# Patient Record
Sex: Male | Born: 1955 | Race: Black or African American | Hispanic: No | Marital: Married | Smoking: Current some day smoker
Health system: Southern US, Community
[De-identification: ages and names within clinical notes are randomized; demographics above are authoritative.]

## PROBLEM LIST (undated history)

## (undated) DIAGNOSIS — E785 Hyperlipidemia, unspecified: Secondary | ICD-10-CM

## (undated) DIAGNOSIS — I779 Disorder of arteries and arterioles, unspecified: Secondary | ICD-10-CM

## (undated) DIAGNOSIS — I2510 Atherosclerotic heart disease of native coronary artery without angina pectoris: Secondary | ICD-10-CM

## (undated) DIAGNOSIS — I4510 Unspecified right bundle-branch block: Secondary | ICD-10-CM

## (undated) DIAGNOSIS — I10 Essential (primary) hypertension: Secondary | ICD-10-CM

## (undated) DIAGNOSIS — R7303 Prediabetes: Secondary | ICD-10-CM

## (undated) DIAGNOSIS — R51 Headache: Secondary | ICD-10-CM

## (undated) HISTORY — DX: Essential (primary) hypertension: I10

## (undated) HISTORY — DX: Unspecified right bundle-branch block: I45.10

## (undated) HISTORY — DX: Atherosclerotic heart disease of native coronary artery without angina pectoris: I25.10

## (undated) HISTORY — DX: Prediabetes: R73.03

## (undated) HISTORY — DX: Disorder of arteries and arterioles, unspecified: I77.9

## (undated) HISTORY — DX: Hyperlipidemia, unspecified: E78.5

## (undated) HISTORY — DX: Headache: R51

## (undated) SURGERY — Surgical Case
Anesthesia: *Unknown

---

## 2003-04-16 ENCOUNTER — Inpatient Hospital Stay (HOSPITAL_COMMUNITY): Admission: EM | Admit: 2003-04-16 | Discharge: 2003-04-19 | Payer: Self-pay | Admitting: Cardiovascular Disease

## 2003-04-16 ENCOUNTER — Encounter: Payer: Self-pay | Admitting: Emergency Medicine

## 2003-06-08 ENCOUNTER — Ambulatory Visit (HOSPITAL_COMMUNITY): Admission: RE | Admit: 2003-06-08 | Discharge: 2003-06-08 | Payer: Self-pay | Admitting: Internal Medicine

## 2004-03-29 ENCOUNTER — Ambulatory Visit: Payer: Self-pay | Admitting: Cardiovascular Disease

## 2004-05-11 ENCOUNTER — Ambulatory Visit (HOSPITAL_BASED_OUTPATIENT_CLINIC_OR_DEPARTMENT_OTHER): Admission: RE | Admit: 2004-05-11 | Discharge: 2004-05-11 | Payer: Self-pay | Admitting: Specialist

## 2004-05-11 ENCOUNTER — Ambulatory Visit (HOSPITAL_COMMUNITY): Admission: RE | Admit: 2004-05-11 | Discharge: 2004-05-11 | Payer: Self-pay | Admitting: Specialist

## 2004-09-27 ENCOUNTER — Ambulatory Visit: Payer: Self-pay | Admitting: Cardiovascular Disease

## 2005-03-12 ENCOUNTER — Encounter: Admission: RE | Admit: 2005-03-12 | Discharge: 2005-03-12 | Payer: Self-pay | Admitting: General Practice

## 2005-12-31 ENCOUNTER — Encounter: Admission: RE | Admit: 2005-12-31 | Discharge: 2005-12-31 | Payer: Self-pay | Admitting: General Practice

## 2008-09-06 ENCOUNTER — Encounter: Payer: Self-pay | Admitting: Cardiovascular Disease

## 2008-09-06 ENCOUNTER — Ambulatory Visit: Payer: Self-pay | Admitting: Cardiovascular Disease

## 2008-09-06 DIAGNOSIS — R51 Headache: Secondary | ICD-10-CM

## 2008-09-06 DIAGNOSIS — I10 Essential (primary) hypertension: Secondary | ICD-10-CM | POA: Insufficient documentation

## 2008-09-06 DIAGNOSIS — I4510 Unspecified right bundle-branch block: Secondary | ICD-10-CM

## 2008-09-06 DIAGNOSIS — I2510 Atherosclerotic heart disease of native coronary artery without angina pectoris: Secondary | ICD-10-CM

## 2008-09-06 DIAGNOSIS — R519 Headache, unspecified: Secondary | ICD-10-CM | POA: Insufficient documentation

## 2009-07-19 ENCOUNTER — Encounter (INDEPENDENT_AMBULATORY_CARE_PROVIDER_SITE_OTHER): Payer: Self-pay | Admitting: *Deleted

## 2010-06-22 NOTE — Letter (Signed)
Summary: Appointment - Reminder 2  [employer], Main Office  [address]   [place], [place] [address]   Phone: [phone]  Fax: [phone]     July 19, 2009 MRN: [id number]   [facility] [address] [place], [place]  [address]   Dear Mr. Raine,  Our records indicate that it is time to schedule a follow-up appointment with Dr. Eden Emms. It is very important that we reach you to schedule this appointment. We look forward to participating in your health care needs. Please contact us at the number listed above at your earliest convenience to schedule your appointment.  If you are unable to make an appointment at this time, give [place] a call so we can update our records.     Sincerely,  Migdalia Dk [facility] Scheduling Team

## 2010-10-06 NOTE — Op Note (Signed)
NAME:  RAMY, GRETH NO.:  [id number]   MEDICAL RECORD NO.:  [id number]          PATIENT TYPE:   LOCATION:                                 FACILITY:   PHYSICIAN:  Jene Every, M.D.         DATE OF BIRTH:   DATE OF PROCEDURE:  06/28/2004  DATE OF DISCHARGE:                                 OPERATIVE REPORT   PREOPERATIVE DIAGNOSIS:  Medial meniscus tear, degenerative joint disease of  the right knee.   POSTOPERATIVE DIAGNOSIS:  Medial meniscus tear, degenerative joint disease  of the right knee, anterior cruciate ligament tear.   PROCEDURE PERFORMED:  Right knee arthroscopy, partial medial meniscectomy,  chondroplasty of the patella, medial femoral condyle, debridement of  anterior cruciate ligament.   ANESTHESIA:  General.   HISTORY:  This is a 55 year old who has a tear of the ACL and complex tear  of the posterior horn of the mid body of meniscus.  Degenerative changes and  varus deformity of the knee were noted.  His acute change was a meniscus  tear requiring _________ and old degenerative ACL tear and degenerative  changes of the knee.  The patient had been refractory to conservative  treatment and was indicated for partial medial meniscectomy and debridement.  Risks and benefits were discussed including bleeding, infection, damage to  vascular structures, no change in symptoms, worsening symptoms, need for  total knee arthroplasty in the future, etc.   DESCRIPTION OF PROCEDURE:  With the patient in the supine position after  induction with adequate general anesthesia and 1 g Kefzol, the right lower  extremity was prepped and draped in the usual sterile fashion.  A lateral  parapatellar portal and superior medial parapatellar portal was fashioned  with a #11 blade.  _________ was atraumatically placed.  Irrigant was  utilized and insufflated into the joint.  Arthroscopic camera was inserted  and inspection revealed degenerative changes of the  patellofemoral joint,  mainly of the medial compartment.  There was a complex tear of the posterior  portion of the medial meniscus.  Under direct visualization, the medial  parapatellar portal was fashioned with a #11 blade after localization with  an 18-gauge needle sparing the medial meniscus.  Vascular rongeur was  introduced utilizing partial medial meniscectomy to a stable base.  This was  further contoured with a ________ shaver.  Degenerative changes and  chondroplasty of the femoral condyle and tibial plateau were performed and  evacuation of loose cartilaginous bodies.  Tear of the ACL was debrided.  This was found to be old. There was no significant excursion with anterior  drawer noted.  A lot of degenerative changes of the lateral compartment and  patellofemoral joint were noted.  These were debrided as well.  The gutters  were unremarkable.  The residual meniscus of the medial meniscus was stable  to probe palpation.  The lateral meniscus was stable to probe palpation as  well.  The chondroplasty of the patella was performed as well as there were  patellofemoral changes.  After this, the knee was copiously lavaged.  The  menisci were re-examined.  There was no evidence of residual displacement or  loose cartilaginous debris.  All instrumentation was removed.  Port was  closed with 4-0  nylon simple sutures and 0.25% Marcaine with epinephrine was infiltrated in  the joint.  The wound was dressed sterilely.  He was awakened without  difficulty and transported to recovery in satisfactory condition.   The patient tolerated the procedure well with no complications.      JB/MEDQ  D:  06/28/2004  T:  06/28/2004  Job:  [id number]

## 2010-10-06 NOTE — Discharge Summary (Signed)
NAME:  Todd Gonzalez, Todd Gonzalez NO.:  [id number]   MEDICAL RECORD NO.:  [id number]                   PATIENT TYPE:  INP   LOCATION:  4739                                 FACILITY:  [facility]   PHYSICIAN:  Charlton Haws, M.D.                  DATE OF BIRTH:  [birth year]   DATE OF ADMISSION:  04/16/2003  DATE OF DISCHARGE:                                 DISCHARGE SUMMARY   DISCHARGE DIAGNOSES:  1. Acute anterior myocardial infarction secondary to plaque rupture at the     mid left anterior descending coronary artery, reduced to 30-40% narrowing     after intervention with good residual lumen.  2. Residual coronary artery disease:  A 90% stenosis in tiny diagonal, a 70%     proximal stenosis in the first diagonal, a 70% stenosis in the proximal     septal, a 50% stenosis proximal first obtuse marginal; left ventricular     function at catheterization is 68%.   SECONDARY DIAGNOSES:  1. Hypertension.  2. Dyslipidemia.   PROCEDURES:  1. April 16, 2003:  Left heart catheterization.  Study showed a 30-40%     probably plaque rupture in the mid LAD.  The first diagonal had a     proximal 70% stenosis.  There is a small diagonal following that with a     90% proximal stenosis, a septal perforator with a 70% proximal stenosis;     then, the first obtuse marginal had a 50% proximal stenosis.  Left     ventricular ejection fraction estimated at 68%.  2. April 16, 2003:  A 2-D echocardiogram.  This study showed that     ejection fraction was 55-65%, no left ventricular regional wall motion     abnormalities.  Wall thickness of the left ventricle is normal.  Normal     aortic valve excursion, no significant aortic regurgitation, normal     mitral valve leaflet excursion, no mitral stenosis.  Mitral regurgitation     was 1+ on a scale of 0-4.  Left atrium is normal, right ventricular size     normal.   DISCHARGE DISPOSITION:  Todd Gonzalez is ready for discharge  on  April 19, 2003 three days after undergoing left heart catheterization.  He was maintained on admission on IV heparin, IV Integrilin, and  nitroglycerin, and for a 48-hour period after the procedure was started on  Plavix, continued on Integrilin, maintained on IV heparin which was  subsequently discontinued secondary to bleeding gums.  The patient was then  started on subcutaneous Lovenox.  This has been continued until his  discharge on November 29.  The patient has had no residual chest pain after  undergoing left heart catheterization.  His mental status is clear.  He has  been afebrile in the postoperative period.  His post procedure enzymes are  as follows:  On November 27 CK  is 415, CK-MB is 28.2.  He goes home with the  following medications:  1. Enteric-coated aspirin 325 mg daily.  2. Plavix 75 mg daily - both of these work to keep his coronary arteries     open.  3. Lopressor 50 mg one-half tablet and one-and-one-half tablet in the     evening.  4. Altace 10 mg daily.  5. Lipitor 80 mg daily at bedtime.  6. For pain - Tylenol 325 mg one to two tablets q.4-6h. as needed.   He is not to return to work for three weeks and he has been given an excuse  to present to his employer.  His discharge diet is a low sodium low  cholesterol diet.  He may shower.  He is to call [phone] if he experiences  swelling or increased pain at the catheterization site.  He has an office  visit with [facility] on Monday, May 03, 2003 at noon - this is  the [facility].  He will see Dr. Eden Emms at [facility]  Wednesday, June 16, 2003 at 9 a.m.   BRIEF HISTORY:  Todd Gonzalez is a 55 year old gentleman from [place].  He has no  previous cardiac history.  He awoke at 2 a.m. on November 26 feeling sick to  his stomach.  He ran to the bathroom but did not vomit.  He had subsequent  substernal chest pain.  He has not had previous history of angina.  The pain  is somewhat  atypical for angina.  He has a somewhat tender area on palpation  of the chest and the pain actually starts in the epigastric area.  He has  had minimal changes in the pain, having been given nitroglycerin.  However,  his electrocardiogram shows J point elevation in leads V2 and V3 with some  slight ST segment depression in leads III and aVF.  There is also a  suggestion of J point elevation in lead I and aVL.  Bedside echocardiogram  showed no regional wall motion abnormalities with good left ventricular  function.  His review of systems is unremarkable except for chronic knee  problems.  He had some sort of knee surgery about one month ago.  The  patient was subsequently taken to the cardiac catheterization laboratory  with the results as dictated above.  The patient has been chest pain free  after the procedure.  The narrowing of the mid LAD is probably secondary to  clot with ruptured plaque in the mid LAD.  There is a 30-40% narrowing with  good residual lumen.  The residual disease to the diagonals and the septal  also may all be clot.  The patient will be maintained on Plavix and aspirin  as an outpatient and was treated with 48 hours of Integrilin and heparin.   LABORATORY STUDIES ON ADMISSION:  Complete blood count:  White cells 6.2,  hemoglobin 15.6, hematocrit 45.3, platelets 309.  Serum electrolytes on  admission:  Sodium 138, potassium 3.6, chloride 104, carbonate 27, BUN 19,  creatinine 1.2, glucose 128.  Cardiac enzymes November 26 at 0452:  CK 334,  CK-MB 1.6, troponin I 0.02.  On November 26 at noon:  CK 694, CK-MB 58.5.  On November 26 at 1845:  CK 540, CK-MB 39.3.  On November 27 at 0200:  CK  415, CK-MB 28.2.  D-dimer was 0.49 on admission.  Alkaline phosphatase 65,  SGOT 23, SGPT 38.  Prior to admission the patient had taken Cialis.  The  patient is apparently on no other medications.     Maple Mirza, P.A.                    Charlton Haws, M.D.    GM/MEDQ  D:   04/19/2003  T:  04/19/2003  Job:  [id number]

## 2010-10-06 NOTE — H&P (Signed)
NAME:  Todd Gonzalez, MAMONE NO.:  [id number]   MEDICAL RECORD NO.:  [id number]                   PATIENT TYPE:  EMS   LOCATION:  ED                                   FACILITY:  [facility]   PHYSICIAN:  Charlton Haws, M.D.                  DATE OF BIRTH:  July 26, 1955   DATE OF ADMISSION:  04/16/2003  DATE OF DISCHARGE:                                HISTORY & PHYSICAL   The patient is currently at [facility], but he is being  transferred to [facility] to a telemetry bed.   Mr. Todd Gonzalez is a 55 year old black gentleman from [place].  He has no previous  cardiac history.  He awoke about 2 o'clock this morning feeling sick to his  stomach.  He ran to the bathroom but did not vomit.  He subsequently had  some substernal chest pain.   The patient did not have any blood in his stools or vomit.   He has not had previous angina.  The pain is somewhat atypical for angina.  He is somewhat tender to palpation in his chest, and the pain actually  starts in his epigastric area.   He has had minimal changes with nitroglycerin up to 15 mcg in the emergency  room.   His electrocardiogram is somewhat worrisome.  He has J-point elevation in  leads V2 and V3 with slight ST segment depression in leads III and aVF.  There is also suggestion of J-point elevation in I and aVL.   Bedside echocardiogram, however, showed no regional wall motion  abnormalities with good LV function.   The patient's Review of Systems is otherwise remarkable for some chronic  knee problems.  He had some sort of knee surgery about a month ago.   There is no clinical evidence for DVT.  He is not short of breath, and there  is no pleuritic nature to the pain.   The patient has been in [place] for a few years.  He is here with a  friend.  He works at [employer].   ALLERGIES:  No known allergies.   MEDICATIONS:  He does not know his medications but does take some anti-  inflammatories.   There is no history of bleeding diaphysis.   PHYSICAL EXAMINATION:  GENERAL:  On examination, he is somewhat teary eyed.  He is in mild distress.  He has some pain to palpation in the epigastric  area and chest.  VITAL SIGNS:  Blood pressure 130/70, pulse 78 and regular.  LUNGS:  Clear.  NECK:  Carotids are normal.  HEART:  There is an S1, S2, normal heart sounds.  EXTREMITIES:  Distal pulses are intact with no edema.   LABORATORY DATA:  EKGs were as described.   Chest x-ray shows mild cardiomegaly but no congestive heart failure or  infiltrates.   IMPRESSION:  Mr. Larch presentation is somewhat atypical  for coronary  syndrome.  However, I am somewhat concerned about his electrocardiogram,  particularly the possible reciprocal depression in the inferior leads.   PLAN:  1. We will treat him aggressively with heparin, Integrilin, nitroglycerin,     IV beta blockers, and aspirin.  2. He will be transferred to [facility].  If his CPKs or troponins     are elevated, we will take him to the catheterization lab.  3. We will also consider taking him emergently to the catheterization lab if     his pain worsens.  4. The patient will have a followup EKG and enzymes in two hours which will     be approximately 7 this morning.  5. We will also do a formal 2-D echocardiogram to further assess LV function     and wall motion.  6. The patient will be shaved and prepped and have consent signed in case he     needs to go urgently to the catheterization lab.  7. The patient and his friend understand the risks of catheterization     including bleeding, myocardial infarction, need for emergency surgery,     and stroke.  They understand the need for this.  8. If his pain resolves quickly and his enzymes are negative, it may be     possible to pursue a noninvasive evaluation, but I will have a low     threshold to proceed with catheterization tonight.                                                Charlton Haws, M.D.    PN/MEDQ  D:  04/16/2003  T:  04/16/2003  Job:  [id number]

## 2010-10-06 NOTE — Cardiovascular Report (Signed)
NAME:  Todd Gonzalez, RUDY NO.:  [id number]   MEDICAL RECORD NO.:  [id number]                   PATIENT TYPE:  INP   LOCATION:  2903                                 FACILITY:  [facility]   PHYSICIAN:  Arturo Morton. Riley Kill, M.D.             DATE OF BIRTH:  [birth year]   DATE OF PROCEDURE:  04/16/2003  DATE OF DISCHARGE:                              CARDIAC CATHETERIZATION   INDICATIONS FOR PROCEDURE:  This gentleman is a 55 year old who presents  with some recurrent chest pain. He has borderline elevated enzymes and there  is an abnormal EKG with slight ST elevation in the anterior precordial  leads. He has continued to have some discomfort  and therefore  he was  brought to the cardiac catheterization laboratory  for emergency evaluation  and for discussion with Dr. Geralynn Rile, who saw the patient and recommended  urgent catheterization. The  risks, benefits and alternatives were discussed  by Dr. Samule Ohm with the patient and he was brought to the laboratory  emergently.   DESCRIPTION OF PROCEDURE:  1. Left heart catheterization.  2. Selective coronary arteriography.  3. Selective left ventriculography.  4. AngioSeal closure of the right femoral artery.   DESCRIPTION OF PROCEDURE:  The patient was brought to the catheterization  laboratory  and prepped and draped in the usual sterile fashion. Through an  anterior puncture the right femoral artery was easily entered. Views of the  left and right coronary arteries were obtained in multiple angiographic  projections. We used a ______ catheter to engage the right coronary artery.  Ventriculography was performed in the RAO projection.   Following this I reviewed the films subsequently with both Dr. Gerri Spore and  Dr. Samule Ohm. The patient had evidence of a mild to moderate narrowing of the  LAD with about 30% to 40% narrowing of both the residual lumen of  at least  2.5 and probably 3 mm. There were 3 small  side  branches including a septal  perforator and probably 2 diagonal branches which had some narrowing,  probably related to in part thrombus. There was excellent runoff into the  distal vessel. Left ventricular function in this territory was well  preserved. Based on this we felt that medical therapy at the present time  with aggressive anticoagulation would be the best option.   Because of this I elected to used AngioSeal closure of the right femoral  artery to ensure that he would get adequate hemostasis. The groin was  reprepped  with Betadine. We left this for at least 5 minutes. Gloves were  changed. The femoral artery was then closed using AngioSeal with good  immediate hemostasis. The patient was then taken to the holding area in  satisfactory clinical condition.   HEMODYNAMIC DATA:  1. Central aortic pressure was 155/91, mean of 116.  2. Left ventricular pressure 140/12.  3. No gradient on pullback across the aortic valve.  ANGIOGRAPHIC DATA:  1. The left main coronary artery was free of critical disease. The left     anterior descending coronary artery coursed to the apex. There was a     focal LAD stenosis overlapping the origin of a septal and diagonal, and     then a 2nd branch which appeared  to be partially a diagonal and possibly     a tiny fistulous vessel to the pulmonary artery. This was really quite     small. The area of the LAD had about 30% to 40% narrowing. The 1st     diagonal had 70% narrowing. It was a small to moderate vessel. The     pulmonary  fistulous vessel was tiny. The septal perforator was small  to     moderate and had 70% proximal narrowing. The remainder of the LAD was     without critical disease and wrapped the apex.  2. The circumflex provided  a 1st marginal branch that  was large in caliber     and without critical narrowing. There was a 2nd marginal branch with     about 50% proximal narrowing and it was small  to moderate. The distal      vessel provided the posterolateral segment.  3. The right coronary artery was a codominant vessel with 2 small  branches.     This vessel appeared  free of critical disease.  4. Ventriculography in the RAO projection was associated with some     ventricular ectopy. The ejection fraction was calculated at 49%.   CONCLUSIONS:  1. Overall well preserved left ventricular function.  2. Probable focal stenosis with possible  ruptured plaque and surface     thrombus in the mid left anterior descending artery but with a     significant residual  lumen.  3. Other findings as noted above.   RECOMMENDATIONS:  We have discussed the various options. Placing a stent  across this area would probably result in loss of these 3 branches. In  addition the MLD appears to be more than adequate for distal flow. Based on  this we will load the patient with Plavix. He will be treated with low-dose  heparin starting at 8 hours and then increased at 24 hours when the  Integrilin is discontinued. The patient had Cialis about 4 days ago and we  will watch for low blood pressure, although his blood pressure is more than  adequate on nitroglycerin.                                               Arturo Morton. Riley Kill, M.D.    TDS/MEDQ  D:  04/16/2003  T:  04/16/2003  Job:  [id number]   cc:   Charlton Haws, M.D.   Salvadore Farber, M.D.  [address]  [address]  [place]  [place] [address]

## 2010-11-14 ENCOUNTER — Encounter: Payer: Self-pay | Admitting: Cardiovascular Disease

## 2011-12-20 ENCOUNTER — Encounter: Payer: Self-pay | Admitting: Cardiovascular Disease

## 2013-03-20 ENCOUNTER — Emergency Department (HOSPITAL_COMMUNITY)
Admission: EM | Admit: 2013-03-20 | Discharge: 2013-03-20 | Disposition: A | Discharge status: Home / Self Care | Payer: Self-pay | Attending: Emergency Medicine | Admitting: Emergency Medicine

## 2013-03-20 ENCOUNTER — Encounter (HOSPITAL_COMMUNITY): Payer: Self-pay | Admitting: Emergency Medicine

## 2013-03-20 ENCOUNTER — Emergency Department (HOSPITAL_COMMUNITY): Payer: Self-pay

## 2013-03-20 DIAGNOSIS — J159 Unspecified bacterial pneumonia: Secondary | ICD-10-CM | POA: Insufficient documentation

## 2013-03-20 DIAGNOSIS — Z8639 Personal history of other endocrine, nutritional and metabolic disease: Secondary | ICD-10-CM | POA: Insufficient documentation

## 2013-03-20 DIAGNOSIS — J40 Bronchitis, not specified as acute or chronic: Secondary | ICD-10-CM

## 2013-03-20 DIAGNOSIS — I10 Essential (primary) hypertension: Secondary | ICD-10-CM | POA: Insufficient documentation

## 2013-03-20 DIAGNOSIS — Z79899 Other long term (current) drug therapy: Secondary | ICD-10-CM | POA: Insufficient documentation

## 2013-03-20 DIAGNOSIS — R0602 Shortness of breath: Secondary | ICD-10-CM | POA: Insufficient documentation

## 2013-03-20 DIAGNOSIS — J209 Acute bronchitis, unspecified: Secondary | ICD-10-CM | POA: Insufficient documentation

## 2013-03-20 DIAGNOSIS — R509 Fever, unspecified: Secondary | ICD-10-CM | POA: Insufficient documentation

## 2013-03-20 DIAGNOSIS — Z862 Personal history of diseases of the blood and blood-forming organs and certain disorders involving the immune mechanism: Secondary | ICD-10-CM | POA: Insufficient documentation

## 2013-03-20 DIAGNOSIS — R5381 Other malaise: Secondary | ICD-10-CM | POA: Insufficient documentation

## 2013-03-20 DIAGNOSIS — I2510 Atherosclerotic heart disease of native coronary artery without angina pectoris: Secondary | ICD-10-CM | POA: Insufficient documentation

## 2013-03-20 DIAGNOSIS — Z791 Long term (current) use of non-steroidal anti-inflammatories (NSAID): Secondary | ICD-10-CM | POA: Insufficient documentation

## 2013-03-20 DIAGNOSIS — Z7982 Long term (current) use of aspirin: Secondary | ICD-10-CM | POA: Insufficient documentation

## 2013-03-20 DIAGNOSIS — J189 Pneumonia, unspecified organism: Secondary | ICD-10-CM

## 2013-03-20 DIAGNOSIS — F17200 Nicotine dependence, unspecified, uncomplicated: Secondary | ICD-10-CM | POA: Insufficient documentation

## 2013-03-20 LAB — BASIC METABOLIC PANEL
BUN: 16 mg/dL (ref 6–23)
CO2: 24 mEq/L (ref 19–32)
Calcium: 9.2 mg/dL (ref 8.4–10.5)
Chloride: 96 mEq/L (ref 96–112)
Creatinine, Ser: 1.05 mg/dL (ref 0.50–1.35)
Glucose, Bld: 147 mg/dL — ABNORMAL HIGH (ref 70–99)
Sodium: 133 mEq/L — ABNORMAL LOW (ref 135–145)

## 2013-03-20 LAB — CBC
HCT: 38.1 % — ABNORMAL LOW (ref 39.0–52.0)
MCH: 30.9 pg (ref 26.0–34.0)
MCV: 86 fL (ref 78.0–100.0)
Platelets: 213 10*3/uL (ref 150–400)
RBC: 4.43 MIL/uL (ref 4.22–5.81)
WBC: 22.3 10*3/uL — ABNORMAL HIGH (ref 4.0–10.5)

## 2013-03-20 LAB — POCT I-STAT TROPONIN I: Troponin i, poc: 0 ng/mL (ref 0.00–0.08)

## 2013-03-20 MED ORDER — KETOROLAC TROMETHAMINE 30 MG/ML IJ SOLN
30.0000 mg | Freq: Once | INTRAMUSCULAR | Status: AC
  Administered 2013-03-20: 30 mg via INTRAVENOUS
  Filled 2013-03-20: qty 1

## 2013-03-20 MED ORDER — IBUPROFEN 800 MG PO TABS: 800.0000 mg | ORAL_TABLET | Freq: Once | ORAL | Status: DC

## 2013-03-20 MED ORDER — ACETAMINOPHEN 325 MG PO TABS
650.0000 mg | ORAL_TABLET | Freq: Once | ORAL | Status: AC
  Administered 2013-03-20: 650 mg via ORAL
  Filled 2013-03-20: qty 2

## 2013-03-20 MED ORDER — BENZONATATE 100 MG PO CAPS: 100.0000 mg | ORAL_CAPSULE | Freq: Three times a day (TID) | ORAL | Status: DC

## 2013-03-20 MED ORDER — SODIUM CHLORIDE 0.9 % IV BOLUS (SEPSIS)
1000.0000 mL | Freq: Once | INTRAVENOUS | Status: AC
  Administered 2013-03-20: 1000 mL via INTRAVENOUS

## 2013-03-20 MED ORDER — LEVOFLOXACIN 500 MG PO TABS: 500.0000 mg | ORAL_TABLET | Freq: Every day | ORAL | Status: DC

## 2013-03-20 NOTE — ED Notes (Signed)
Pt back from x-ray.

## 2013-03-20 NOTE — ED Notes (Signed)
Pt reports painful inspiration since yesterday, and also loss of appetite. He went to his doctor who told him he should come to the ED because "my pulse felt fast when he checked it." A&Ox4, resp e/u

## 2013-03-20 NOTE — ED Provider Notes (Signed)
CSN: [id number]     Arrival date & time 03/20/13  1622 History   First MD Initiated Contact with Patient 03/20/13 1730     Chief Complaint  Patient presents with  . Chest Pain    HPI  Patient presents with right-sided chest pain present intermittent right mid pleuritic chest pain. Nonradiating. No shoulder pain no left-sided pain. He is not short of breath. Has had a cough. Seen his primary care physician and referred here for heart rate 126. Afebrile his physicians. At the time of my initial examination I rechecked his temperature in the room and it  is 101.4 no GI complaints. No left-sided pain.  Past Medical History  Diagnosis Date  . CAD (coronary artery disease)     ruptured plaque in OM 2003  . HTN (hypertension)   . HLD (hyperlipidemia)   . Headache(784.0)   . RBBB (right bundle branch block)    History reviewed. No pertinent past surgical history. History reviewed. No pertinent family history. History  Substance Use Topics  . Smoking status: Current Some Day Smoker  . Smokeless tobacco: Not on file     Comment: non-smoker  . Alcohol Use: Yes    Review of Systems  Constitutional: Positive for fever and fatigue. Negative for chills, diaphoresis and appetite change.  HENT: Negative for mouth sores, sore throat and trouble swallowing.   Eyes: Negative for visual disturbance.  Respiratory: Positive for cough and shortness of breath. Negative for chest tightness and wheezing.   Cardiovascular: Positive for chest pain.  Gastrointestinal: Negative for nausea, vomiting, abdominal pain, diarrhea and abdominal distention.  Endocrine: Negative for polydipsia, polyphagia and polyuria.  Genitourinary: Negative for dysuria, frequency and hematuria.  Musculoskeletal: Negative for gait problem.  Skin: Negative for color change, pallor and rash.  Neurological: Negative for dizziness, syncope, light-headedness and headaches.  Hematological: Does not bruise/bleed easily.    Psychiatric/Behavioral: Negative for behavioral problems and confusion.    Allergies  Atorvastatin  Home Medications   Current Outpatient Rx  Name  Route  Sig  Dispense  Refill  . aspirin 325 MG tablet   Oral   Take 325 mg by mouth daily.           . naproxen sodium (ANAPROX) 220 MG tablet   Oral   Take 220 mg by mouth 2 (two) times daily with a meal.         . benzonatate (TESSALON) 100 MG capsule   Oral   Take 1 capsule (100 mg total) by mouth every 8 (eight) hours.   21 capsule   0   . levofloxacin (LEVAQUIN) 500 MG tablet   Oral   Take 1 tablet (500 mg total) by mouth daily.   10 tablet   0    BP 133/77  Pulse 126  Temp(Src) 99.4 F (37.4 C) (Oral)  Resp 18  Ht 5\' 8"  (1.727 m)  Wt 164 lb 3.2 oz (74.481 kg)  BMI 24.97 kg/m2  SpO2 97% Physical Exam  Constitutional: He is oriented to person, place, and time. He appears well-developed and well-nourished. No distress.  HENT:  Head: Normocephalic.  Eyes: Conjunctivae are normal. Pupils are equal, round, and reactive to light. No scleral icterus.  Neck: Normal range of motion. Neck supple. No thyromegaly present.  Cardiovascular: Normal rate and regular rhythm.  Exam reveals no gallop and no friction rub.   No murmur heard. Pulmonary/Chest: Effort normal. No respiratory distress. He has no wheezes. He has rhonchi in the right  middle field. He has no rales.      Abdominal: Soft. Bowel sounds are normal. He exhibits no distension. There is no tenderness. There is no rebound.  Musculoskeletal: Normal range of motion.  Neurological: He is alert and oriented to person, place, and time.  Skin: Skin is warm and dry. No rash noted.  Psychiatric: He has a normal mood and affect. His behavior is normal.    ED Course  Procedures (including critical care time) Labs Review Labs Reviewed  CBC - Abnormal; Notable for the following:    WBC 22.3 (*)    HCT 38.1 (*)    All other components within normal limits   BASIC METABOLIC PANEL - Abnormal; Notable for the following:    Sodium 133 (*)    Glucose, Bld 147 (*)    GFR calc non Af Amer 76 (*)    GFR calc Af Amer 88 (*)    All other components within normal limits  POCT I-STAT TROPONIN I   Imaging Review Dg Chest 2 View  03/20/2013   CLINICAL DATA:  Chest pain today. Fever and headache. History of myocardial infarction and hypertension.  EXAM: CHEST  2 VIEW  COMPARISON:  None.  FINDINGS: The heart size and mediastinal contours are normal. The lungs are clear. There is no pleural effusion or pneumothorax. No acute osseous findings are identified. Age advanced glenohumeral degenerative changes are present, worse on the left.  IMPRESSION: No active cardiopulmonary process. Age advanced glenohumeral degenerative changes bilaterally. This could be posttraumatic, although neuropathic joint should be considered.   Electronically Signed   By: Roxy Horseman M.D.   On: 03/20/2013 18:21    EKG Interpretation   None       MDM   1. Fever   2. Bronchitis   3. Community acquired pneumonia    On initial evaluation Mr. dye had a temperature of 101.4. Heart rate was 126. He's been given Tylenol, 1 L fluid, 30 of Toradol. He has some rhonchi to his anterior posterior mid right lung. No frank crackles. No diminished breath sounds. X-ray does not show infiltrate. After the above he was feeling improved. He remains well oxygenated. Recheck temperature meantime 101.9 given some Motrin here. Think this may be a viral and pleurisy. This may be early pneumonia. Think is probably appropriate for outpatient treatment.  He states he feels much better. Plan Levaquin and Tessalon Motrin Tylenol fever information bronchitis information pneumonia information recheck here shortness of breath bloody cough worsening symptoms left-sided pain.    Roney Marion, MD 03/20/13 [phone]

## 2013-03-20 NOTE — ED Notes (Signed)
Pt states when he stands up he sometimes feels dizzy. Denies N/V/D and SOB

## 2013-03-20 NOTE — ED Notes (Signed)
Pt stats day before yesterday, started to feel cold, thought it was a fever. States it hurts when he breathes in near right upper chest and right lower abdomen. Pt states when he holds still he is not in any pain. Pt has history of "blood clots" and states hes had a cardiac catheter before and was placed on aspirin.

## 2013-03-30 ENCOUNTER — Emergency Department (HOSPITAL_COMMUNITY)
Admission: EM | Admit: 2013-03-30 | Discharge: 2013-03-30 | Disposition: A | Discharge status: Home / Self Care | Payer: Self-pay | Attending: Emergency Medicine | Admitting: Emergency Medicine

## 2013-03-30 ENCOUNTER — Emergency Department (HOSPITAL_COMMUNITY): Payer: Self-pay

## 2013-03-30 ENCOUNTER — Encounter (HOSPITAL_COMMUNITY): Payer: Self-pay | Admitting: Emergency Medicine

## 2013-03-30 DIAGNOSIS — Z8639 Personal history of other endocrine, nutritional and metabolic disease: Secondary | ICD-10-CM | POA: Insufficient documentation

## 2013-03-30 DIAGNOSIS — M1710 Unilateral primary osteoarthritis, unspecified knee: Secondary | ICD-10-CM | POA: Insufficient documentation

## 2013-03-30 DIAGNOSIS — Z792 Long term (current) use of antibiotics: Secondary | ICD-10-CM | POA: Insufficient documentation

## 2013-03-30 DIAGNOSIS — I10 Essential (primary) hypertension: Secondary | ICD-10-CM | POA: Insufficient documentation

## 2013-03-30 DIAGNOSIS — I2510 Atherosclerotic heart disease of native coronary artery without angina pectoris: Secondary | ICD-10-CM | POA: Insufficient documentation

## 2013-03-30 DIAGNOSIS — IMO0002 Reserved for concepts with insufficient information to code with codable children: Secondary | ICD-10-CM | POA: Insufficient documentation

## 2013-03-30 DIAGNOSIS — Z862 Personal history of diseases of the blood and blood-forming organs and certain disorders involving the immune mechanism: Secondary | ICD-10-CM | POA: Insufficient documentation

## 2013-03-30 DIAGNOSIS — M1990 Unspecified osteoarthritis, unspecified site: Secondary | ICD-10-CM

## 2013-03-30 DIAGNOSIS — F17200 Nicotine dependence, unspecified, uncomplicated: Secondary | ICD-10-CM | POA: Insufficient documentation

## 2013-03-30 DIAGNOSIS — Z79899 Other long term (current) drug therapy: Secondary | ICD-10-CM | POA: Insufficient documentation

## 2013-03-30 DIAGNOSIS — Z7982 Long term (current) use of aspirin: Secondary | ICD-10-CM | POA: Insufficient documentation

## 2013-03-30 MED ORDER — HYDROCODONE-ACETAMINOPHEN 5-325 MG PO TABS: 2.0000 | ORAL_TABLET | Freq: Four times a day (QID) | ORAL | Status: DC | PRN

## 2013-03-30 NOTE — ED Notes (Signed)
Pt c/o right knee pain x 1 week; pt denies obvious injury

## 2013-03-30 NOTE — ED Provider Notes (Signed)
Medical screening examination/treatment/procedure(s) were performed by non-physician practitioner and as supervising physician I was immediately available for consultation/collaboration.  Paddy Neis L Mansa Willers, MD 03/30/13 1616 

## 2013-03-30 NOTE — ED Provider Notes (Signed)
CSN: [id number]     Arrival date & time 03/30/13  1031 History  This chart was scribed for non-physician practitioner Roxy Horseman, PA-C  working with Flint Melter, MD by Leone Payor, ED Scribe. This patient was seen in room TR08C/TR08C and the patient's care was started at 1031.    Chief Complaint  Patient presents with  . Knee Pain    The history is provided by the patient. No language interpreter was used.    HPI Comments: Todd Gonzalez is a 57 y.o. male who presents to the Emergency Department complaining of constant, unchanged right knee pain that began about 6 days ago. Pt denies any recent falls, MVC's, or trauma recently. He denies similar symptoms in the past. Pt states he has been using a cane for the past few days due to the pain. He reports certain movements worsen the pain. He denies numbness.    Past Medical History  Diagnosis Date  . CAD (coronary artery disease)     ruptured plaque in OM 2003  . HTN (hypertension)   . HLD (hyperlipidemia)   . Headache(784.0)   . RBBB (right bundle branch block)    History reviewed. No pertinent past surgical history. History reviewed. No pertinent family history. History  Substance Use Topics  . Smoking status: Current Some Day Smoker  . Smokeless tobacco: Not on file     Comment: non-smoker  . Alcohol Use: Yes    Review of Systems A complete 10 system review of systems was obtained and all systems are negative except as noted in the HPI and PMH.   Allergies  Atorvastatin  Home Medications   Current Outpatient Rx  Name  Route  Sig  Dispense  Refill  . aspirin 325 MG tablet   Oral   Take 325 mg by mouth daily.           . benzonatate (TESSALON) 100 MG capsule   Oral   Take 1 capsule (100 mg total) by mouth every 8 (eight) hours.   21 capsule   0   . levofloxacin (LEVAQUIN) 500 MG tablet   Oral   Take 500 mg by mouth daily. For 10 days         . naproxen sodium (ANAPROX) 220 MG tablet   Oral   Take  220 mg by mouth 2 (two) times daily with a meal.          BP 149/90  Pulse 110  Temp(Src) 97.1 F (36.2 C) (Oral)  Resp 18  Ht 5\' 6"  (1.676 m)  Wt 164 lb (74.39 kg)  BMI 26.48 kg/m2  SpO2 99% Physical Exam  Nursing note and vitals reviewed. Constitutional: He is oriented to person, place, and time. He appears well-developed and well-nourished.  HENT:  Head: Normocephalic and atraumatic.  Cardiovascular: Normal rate.   Pulmonary/Chest: Effort normal.  Abdominal: He exhibits no distension.  Musculoskeletal:       Right knee: He exhibits no erythema. Tenderness found. Lateral joint line tenderness noted.  Right knee moderately swollen, non erythematous. Tenderness to palpation with lateral joint line involvement. mildly painful with flexion and extension. Strength 5/5. Joint stability testing is intact, but testing could be limited secondary to patient guarding.   Neurological: He is alert and oriented to person, place, and time.  Skin: Skin is warm and dry.  Psychiatric: He has a normal mood and affect.    ED Course  Procedures   DIAGNOSTIC STUDIES: Oxygen Saturation is 99% on  RA, normal by my interpretation.    COORDINATION OF CARE: 11:17 AM Will order XRAY of right knee. Discussed treatment plan with pt at bedside and pt agreed to plan.   Labs Review Labs Reviewed - No data to display Imaging Review Dg Knee Complete 4 Views Right  03/30/2013   CLINICAL DATA:  Pain and swelling  EXAM: RIGHT KNEE - COMPLETE 4+ VIEW  COMPARISON:  None.  FINDINGS: Tricompartmental right knee osteoarthritis noted, most pronounced in the medial compartment. Slight soft tissue swelling medially noted on the frontal view. Diffuse sclerosis at osteophyte formation noted. Normal alignment without fracture. No large effusion.  IMPRESSION: Tricompartmental osteoarthritis. No acute finding by plain radiography.   Electronically Signed   By: Ruel Favors M.D.   On: 03/30/2013 11:53    EKG  Interpretation   None       MDM   1. Osteoarthritis    Patient with right knee pain, no acute injury. No erythema, or signs of infection. Patient is afebrile. Vital signs are stable. Discharged to home with orthopedic followup. Knee plain films remarkable for osteoarthritis. Recommend conservative therapy. Will give a knee sleeve. Patient is stable and ready for discharge.  Had a thorough recently for chest pain/tachycardia.  This workup was reviewed.  The patient does not complain of any chest pain or SOB.  I personally performed the services described in this documentation, which was scribed in my presence. The recorded information has been reviewed and is accurate.    Roxy Horseman, PA-C 03/30/13 1527

## 2018-04-30 ENCOUNTER — Encounter (HOSPITAL_COMMUNITY): Payer: Self-pay | Admitting: Emergency Medicine

## 2018-04-30 ENCOUNTER — Other Ambulatory Visit: Payer: Self-pay

## 2018-04-30 ENCOUNTER — Emergency Department (HOSPITAL_COMMUNITY)
Admission: EM | Admit: 2018-04-30 | Discharge: 2018-04-30 | Disposition: A | Discharge status: Home / Self Care | Payer: Self-pay | Attending: Emergency Medicine | Admitting: Emergency Medicine

## 2018-04-30 DIAGNOSIS — K4090 Unilateral inguinal hernia, without obstruction or gangrene, not specified as recurrent: Secondary | ICD-10-CM | POA: Insufficient documentation

## 2018-04-30 DIAGNOSIS — Z79899 Other long term (current) drug therapy: Secondary | ICD-10-CM | POA: Insufficient documentation

## 2018-04-30 DIAGNOSIS — Z7982 Long term (current) use of aspirin: Secondary | ICD-10-CM | POA: Insufficient documentation

## 2018-04-30 DIAGNOSIS — I10 Essential (primary) hypertension: Secondary | ICD-10-CM | POA: Insufficient documentation

## 2018-04-30 DIAGNOSIS — I2510 Atherosclerotic heart disease of native coronary artery without angina pectoris: Secondary | ICD-10-CM | POA: Insufficient documentation

## 2018-04-30 DIAGNOSIS — F17200 Nicotine dependence, unspecified, uncomplicated: Secondary | ICD-10-CM | POA: Insufficient documentation

## 2018-04-30 LAB — COMPREHENSIVE METABOLIC PANEL
ALT: 24 U/L (ref 0–44)
AST: 22 U/L (ref 15–41)
Albumin: 3.6 g/dL (ref 3.5–5.0)
Alkaline Phosphatase: 56 U/L (ref 38–126)
Anion gap: 11 (ref 5–15)
BILIRUBIN TOTAL: 0.9 mg/dL (ref 0.3–1.2)
BUN: 14 mg/dL (ref 8–23)
CHLORIDE: 105 mmol/L (ref 98–111)
CO2: 23 mmol/L (ref 22–32)
CREATININE: 0.91 mg/dL (ref 0.61–1.24)
Calcium: 9.1 mg/dL (ref 8.9–10.3)
GFR calc Af Amer: >60 mL/min (ref >60)
GFR calc non Af Amer: >60 mL/min (ref >60)
GLUCOSE: 143 mg/dL — AB (ref 70–99)
Potassium: 4 mmol/L (ref 3.5–5.1)
Sodium: 139 mmol/L (ref 135–145)
Total Protein: 7.1 g/dL (ref 6.5–8.1)

## 2018-04-30 LAB — URINALYSIS, ROUTINE W REFLEX MICROSCOPIC
BACTERIA UA: NONE SEEN
Bilirubin Urine: NEGATIVE
Glucose, UA: NEGATIVE mg/dL
Ketones, ur: NEGATIVE mg/dL
Leukocytes, UA: NEGATIVE
Nitrite: NEGATIVE
PH: 5 (ref 5.0–8.0)
Protein, ur: NEGATIVE mg/dL
Specific Gravity, Urine: 1.024 (ref 1.005–1.030)

## 2018-04-30 LAB — CBC
HCT: 41.9 % (ref 39.0–52.0)
Hemoglobin: 13.4 g/dL (ref 13.0–17.0)
MCH: 28.9 pg (ref 26.0–34.0)
MCHC: 32 g/dL (ref 30.0–36.0)
MCV: 90.3 fL (ref 80.0–100.0)
PLATELETS: 327 10*3/uL (ref 150–400)
RBC: 4.64 MIL/uL (ref 4.22–5.81)
RDW: 13.1 % (ref 11.5–15.5)
WBC: 9.4 10*3/uL (ref 4.0–10.5)
nRBC: 0 % (ref 0.0–0.2)

## 2018-04-30 LAB — LIPASE, BLOOD: Lipase: 28 U/L (ref 11–51)

## 2018-04-30 MED ORDER — POLYETHYLENE GLYCOL 3350 17 G PO PACK
17.0000 g | PACK | Freq: Every day | ORAL | 0 refills | Status: AC
Start: 2018-04-30 — End: 2018-05-30

## 2018-04-30 NOTE — ED Triage Notes (Signed)
Pt reports 10/10 lower abd pain that hurts with movement and coughing. Productive cough with tan-ish colored mucous that started two days ago. Pt denies N/V/D. Denies sob.

## 2018-04-30 NOTE — ED Provider Notes (Signed)
[facility]   CSN: [id number] Arrival date & time: 04/30/18  1845     History   Chief Complaint Chief Complaint  Patient presents with  . Abdominal Pain  . Cough    HPI Todd Gonzalez is a 62 y.o. male.  The history is provided by the patient.  Abdominal Pain   This is a new problem. The current episode started more than 2 days ago. The problem occurs daily. The problem has not changed since onset.The pain is associated with an unknown factor. The pain is located in the RLQ. The quality of the pain is aching and dull. The pain is at a severity of 2/10. The pain is mild. Pertinent negatives include anorexia, fever, belching, diarrhea, flatus, hematochezia, melena, nausea, vomiting, constipation, dysuria, frequency, hematuria, headaches, arthralgias and myalgias. Nothing aggravates the symptoms. Nothing relieves the symptoms. Past workup does not include GI consult or surgery. His past medical history does not include PUD, GERD or ulcerative colitis.    Past Medical History:  Diagnosis Date  . CAD (coronary artery disease)    ruptured plaque in OM 2003  . Headache(784.0)   . HLD (hyperlipidemia)   . HTN (hypertension)   . RBBB (right bundle branch block)     Patient Active Problem List   Diagnosis Date Noted  . ESSENTIAL HYPERTENSION, BENIGN 09/06/2008  . CORONARY ATHEROSCLEROSIS NATIVE CORONARY ARTERY 09/06/2008  . RBBB 09/06/2008  . HEADACHE 09/06/2008    History reviewed. No pertinent surgical history.      Home Medications    Prior to Admission medications   Medication Sig Start Date End Date Taking? Authorizing Provider  aspirin 325 MG tablet Take 325 mg by mouth daily.     Yes [provider]  naproxen sodium (ANAPROX) 220 MG tablet Take 220 mg by mouth 2 (two) times daily with a meal.   Yes [provider]  predniSONE (DELTASONE) 5 MG tablet Take 10 mg by mouth daily as needed (knee  pain).   Yes [provider]  benzonatate (TESSALON) 100 MG capsule Take 1 capsule (100 mg total) by mouth every 8 (eight) hours. Patient not taking: Reported on 04/30/2018 03/20/13   Rolland PorterJames, Mark, MD  HYDROcodone-acetaminophen (NORCO/VICODIN) 5-325 MG per tablet Take 2 tablets by mouth every 6 (six) hours as needed. Patient not taking: Reported on 04/30/2018 03/30/13   Roxy HorsemanBrowning, Robert, PA-C  polyethylene glycol [facility](MIRALAX / Ethelene HalGLYCOLAX) packet Take 17 g by mouth daily. 04/30/18 05/30/18  Virgina Norfolkuratolo, Mahaila Tischer, DO    Family History No family history on file.  Social History Social History   Tobacco Use  . Smoking status: Current Some Day Smoker    Packs/day: 0.25  . Smokeless tobacco: Never Used  Substance Use Topics  . Alcohol use: Yes    Comment: occ  . Drug use: No     Allergies   Atorvastatin   Review of Systems Review of Systems  Constitutional: Negative for chills and fever.  HENT: Negative for ear pain and sore throat.   Eyes: Negative for pain and visual disturbance.  Respiratory: Negative for cough and shortness of breath.   Cardiovascular: Negative for chest pain and palpitations.  Gastrointestinal: Positive for abdominal pain. Negative for anorexia, constipation, diarrhea, flatus, hematochezia, melena, nausea and vomiting.  Genitourinary: Negative for decreased urine volume, difficulty urinating, discharge, dysuria, enuresis, flank pain, frequency, genital sores, hematuria, penile pain, penile swelling, scrotal swelling and testicular pain.  Musculoskeletal: Negative for arthralgias, back  pain and myalgias.  Skin: Negative for color change and rash.  Neurological: Negative for seizures, syncope and headaches.  All other systems reviewed and are negative.    Physical Exam Updated Vital Signs  ED Triage Vitals  Enc Vitals Group     BP 04/30/18 1907 (!) 178/97     Pulse Rate 04/30/18 1907 85     Resp 04/30/18 1907 18     Temp 04/30/18 1907 98.4 F (36.9 C)      Temp Source 04/30/18 1907 Oral     SpO2 04/30/18 1907 98 %     Weight 04/30/18 1908 170 lb (77.1 kg)     Height 04/30/18 1908 5\' 4"  (1.626 m)     Head Circumference --      Peak Flow --      Pain Score 04/30/18 1908 10     Pain Loc --      Pain Edu? --      Excl. in GC? --     Physical Exam  Constitutional: He appears well-developed and well-nourished.  HENT:  Head: Normocephalic and atraumatic.  Mouth/Throat: Oropharynx is clear and moist. No oropharyngeal exudate.  Eyes: Pupils are equal, round, and reactive to light. Conjunctivae and EOM are normal.  Neck: Neck supple.  Cardiovascular: Normal rate and regular rhythm.  No murmur heard. Pulmonary/Chest: Effort normal and breath sounds normal. No respiratory distress.  Abdominal: Soft. Normal appearance. There is no tenderness. There is no rigidity, no rebound, no guarding, no CVA tenderness, no tenderness at McBurney's point and negative Murphy's sign. A hernia is present. Hernia confirmed positive in the right inguinal area (hernia, easily reducible ).  Genitourinary: Testes normal and penis normal. Cremasteric reflex is present. Right testis shows no mass, no swelling and no tenderness. Left testis shows no mass, no swelling and no tenderness.  Musculoskeletal: He exhibits no edema.  Neurological: He is alert.  Skin: Skin is warm and dry.  Psychiatric: He has a normal mood and affect.  Nursing note and vitals reviewed.    ED Treatments / Results  Labs (all labs ordered are listed, but only abnormal results are displayed) Labs Reviewed  COMPREHENSIVE METABOLIC PANEL - Abnormal; Notable for the following components:      Result Value   Glucose, Bld 143 (*)    All other components within normal limits  URINALYSIS, ROUTINE W REFLEX MICROSCOPIC - Abnormal; Notable for the following components:   Hgb urine dipstick SMALL (*)    All other components within normal limits  LIPASE, BLOOD  CBC    EKG None  Radiology No  results found.  Procedures Procedures (including critical care time)  Medications Ordered in ED Medications - No data to display   Initial Impression / Assessment and Plan / ED Course  I have reviewed the triage vital signs and the nursing notes.  Pertinent labs & imaging results that were available during my care of the patient were reviewed by me and considered in my medical decision making (see chart for details).     Todd Gonzalez is a 62 year old male with history of hypertension, high cholesterol who presents to the ED with abdominal pain.  Patient with normal vitals.  No fever.  Patient with lower abdominal pain for the last several days.  Pain worse with coughing.  Patient appears to have right inguinal hernia on exam that is easily reduced on exam.  Patient felt better after hernia was reduced.  He had lab work that showed  no significant electrolyte abnormality, anemia, leukocytosis.  Urinalysis was negative for infection.  Was educated about hernias.  Given information to follow-up with general surgery.  Given prescription for MiraLAX.  Recommend no heavy lifting if possible.  Given return precautions and discharged from the ED in good condition.  Concern for appendicitis or other intra-abdominal etiology.  This chart was dictated using voice recognition software.  Despite best efforts to proofread,  errors can occur which can change the documentation meaning.   Final Clinical Impressions(s) / ED Diagnoses   Final diagnoses:  Unilateral inguinal hernia without obstruction or gangrene, recurrence not specified    ED Discharge Orders         Ordered    polyethylene glycol (MIRALAX / GLYCOLAX) packet  Daily     04/30/18 2133           Virgina Norfolk, DO 04/30/18 2136

## 2020-01-19 ENCOUNTER — Inpatient Hospital Stay (HOSPITAL_COMMUNITY)
Admission: EM | Admit: 2020-01-19 | Discharge: 2020-01-25 | DRG: 234 | Disposition: A | Discharge status: Home / Self Care | Payer: Medicaid Other | Attending: Cardiothoracic Surgery | Admitting: Cardiothoracic Surgery

## 2020-01-19 ENCOUNTER — Encounter (HOSPITAL_COMMUNITY)
Admission: EM | Disposition: A | Discharge status: Home / Self Care | Payer: Self-pay | Source: Home / Self Care | Attending: Cardiothoracic Surgery

## 2020-01-19 ENCOUNTER — Encounter (HOSPITAL_COMMUNITY): Payer: Self-pay

## 2020-01-19 ENCOUNTER — Emergency Department (HOSPITAL_COMMUNITY): Payer: Medicaid Other

## 2020-01-19 ENCOUNTER — Other Ambulatory Visit: Payer: Self-pay | Admitting: *Deleted

## 2020-01-19 ENCOUNTER — Other Ambulatory Visit: Payer: Self-pay

## 2020-01-19 DIAGNOSIS — I214 Non-ST elevation (NSTEMI) myocardial infarction: Secondary | ICD-10-CM | POA: Diagnosis present

## 2020-01-19 DIAGNOSIS — Z7982 Long term (current) use of aspirin: Secondary | ICD-10-CM

## 2020-01-19 DIAGNOSIS — Z791 Long term (current) use of non-steroidal anti-inflammatories (NSAID): Secondary | ICD-10-CM

## 2020-01-19 DIAGNOSIS — Z20822 Contact with and (suspected) exposure to covid-19: Secondary | ICD-10-CM | POA: Diagnosis present

## 2020-01-19 DIAGNOSIS — E785 Hyperlipidemia, unspecified: Secondary | ICD-10-CM | POA: Diagnosis present

## 2020-01-19 DIAGNOSIS — I25110 Atherosclerotic heart disease of native coronary artery with unstable angina pectoris: Secondary | ICD-10-CM

## 2020-01-19 DIAGNOSIS — F17210 Nicotine dependence, cigarettes, uncomplicated: Secondary | ICD-10-CM | POA: Diagnosis present

## 2020-01-19 DIAGNOSIS — I2510 Atherosclerotic heart disease of native coronary artery without angina pectoris: Secondary | ICD-10-CM | POA: Diagnosis present

## 2020-01-19 DIAGNOSIS — I454 Nonspecific intraventricular block: Secondary | ICD-10-CM | POA: Diagnosis not present

## 2020-01-19 DIAGNOSIS — I249 Acute ischemic heart disease, unspecified: Secondary | ICD-10-CM

## 2020-01-19 DIAGNOSIS — J939 Pneumothorax, unspecified: Secondary | ICD-10-CM

## 2020-01-19 DIAGNOSIS — Z888 Allergy status to other drugs, medicaments and biological substances status: Secondary | ICD-10-CM

## 2020-01-19 DIAGNOSIS — R079 Chest pain, unspecified: Secondary | ICD-10-CM | POA: Diagnosis present

## 2020-01-19 DIAGNOSIS — I10 Essential (primary) hypertension: Secondary | ICD-10-CM

## 2020-01-19 DIAGNOSIS — E8770 Fluid overload, unspecified: Secondary | ICD-10-CM | POA: Diagnosis not present

## 2020-01-19 DIAGNOSIS — I4510 Unspecified right bundle-branch block: Secondary | ICD-10-CM | POA: Diagnosis present

## 2020-01-19 DIAGNOSIS — Z951 Presence of aortocoronary bypass graft: Secondary | ICD-10-CM

## 2020-01-19 DIAGNOSIS — D62 Acute posthemorrhagic anemia: Secondary | ICD-10-CM | POA: Diagnosis not present

## 2020-01-19 DIAGNOSIS — Z9889 Other specified postprocedural states: Secondary | ICD-10-CM

## 2020-01-19 DIAGNOSIS — Z01811 Encounter for preprocedural respiratory examination: Secondary | ICD-10-CM

## 2020-01-19 HISTORY — PX: LEFT HEART CATH AND CORONARY ANGIOGRAPHY: CATH118249

## 2020-01-19 LAB — CBC WITH DIFFERENTIAL/PLATELET
Abs Immature Granulocytes: 0.04 10*3/uL (ref 0.00–0.07)
Basophils Absolute: 0 10*3/uL (ref 0.0–0.1)
Basophils Relative: 0 %
Eosinophils Absolute: 0 10*3/uL (ref 0.0–0.5)
Eosinophils Relative: 1 %
HCT: 42.5 % (ref 39.0–52.0)
Hemoglobin: 14.1 g/dL (ref 13.0–17.0)
Immature Granulocytes: 1 %
Lymphocytes Relative: 41 %
Lymphs Abs: 3.3 10*3/uL (ref 0.7–4.0)
MCH: 29.8 pg (ref 26.0–34.0)
MCHC: 33.2 g/dL (ref 30.0–36.0)
MCV: 89.9 fL (ref 80.0–100.0)
Monocytes Absolute: 0.7 10*3/uL (ref 0.1–1.0)
Monocytes Relative: 9 %
Neutro Abs: 3.9 10*3/uL (ref 1.7–7.7)
Neutrophils Relative %: 48 %
Platelets: 285 10*3/uL (ref 150–400)
RBC: 4.73 MIL/uL (ref 4.22–5.81)
RDW: 14.5 % (ref 11.5–15.5)
WBC: 8.1 10*3/uL (ref 4.0–10.5)
nRBC: 0 % (ref 0.0–0.2)

## 2020-01-19 LAB — BASIC METABOLIC PANEL
Anion gap: 14 (ref 5–15)
BUN: 17 mg/dL (ref 8–23)
CO2: 21 mmol/L — ABNORMAL LOW (ref 22–32)
Calcium: 9 mg/dL (ref 8.9–10.3)
Chloride: 103 mmol/L (ref 98–111)
Creatinine, Ser: 1 mg/dL (ref 0.61–1.24)
GFR calc Af Amer: >60 mL/min (ref >60)
GFR calc non Af Amer: >60 mL/min (ref >60)
Glucose, Bld: 135 mg/dL — ABNORMAL HIGH (ref 70–99)
Potassium: 4 mmol/L (ref 3.5–5.1)
Sodium: 138 mmol/L (ref 135–145)

## 2020-01-19 LAB — TROPONIN I (HIGH SENSITIVITY)
Troponin I (High Sensitivity): 88 ng/L — ABNORMAL HIGH (ref <18)
Troponin I (High Sensitivity): 109 ng/L (ref <18)

## 2020-01-19 LAB — SARS CORONAVIRUS 2 BY RT PCR (HOSPITAL ORDER, PERFORMED IN ~~LOC~~ HOSPITAL LAB): SARS Coronavirus 2: NEGATIVE

## 2020-01-19 SURGERY — LEFT HEART CATH AND CORONARY ANGIOGRAPHY
Anesthesia: LOCAL

## 2020-01-19 MED ORDER — SODIUM CHLORIDE 0.9 % IV SOLN
INTRAVENOUS | Status: AC | PRN
  Administered 2020-01-19: 10 mL/h via INTRAVENOUS

## 2020-01-19 MED ORDER — HEPARIN (PORCINE) IN NACL 1000-0.9 UT/500ML-% IV SOLN
INTRAVENOUS | Status: AC
  Filled 2020-01-19: qty 1000

## 2020-01-19 MED ORDER — VERAPAMIL HCL 2.5 MG/ML IV SOLN
INTRAVENOUS | Status: DC | PRN
  Administered 2020-01-19: 10 mL via INTRA_ARTERIAL

## 2020-01-19 MED ORDER — FENTANYL CITRATE (PF) 100 MCG/2ML IJ SOLN
INTRAMUSCULAR | Status: AC
  Filled 2020-01-19: qty 2

## 2020-01-19 MED ORDER — VERAPAMIL HCL 2.5 MG/ML IV SOLN
INTRAVENOUS | Status: AC
  Filled 2020-01-19: qty 2

## 2020-01-19 MED ORDER — ONDANSETRON HCL 4 MG/2ML IJ SOLN: 4.0000 mg | Freq: Four times a day (QID) | INTRAMUSCULAR | Status: DC | PRN

## 2020-01-19 MED ORDER — ASPIRIN 81 MG PO CHEW
324.0000 mg | CHEWABLE_TABLET | Freq: Once | ORAL | Status: AC
  Administered 2020-01-19: 324 mg via ORAL
  Filled 2020-01-19: qty 4

## 2020-01-19 MED ORDER — HEPARIN (PORCINE) 25000 UT/250ML-% IV SOLN
1100.0000 [IU]/h | INTRAVENOUS | Status: DC
  Administered 2020-01-20 (×2): 1100 [IU]/h via INTRAVENOUS
  Filled 2020-01-19 (×2): qty 250

## 2020-01-19 MED ORDER — LIDOCAINE HCL (PF) 1 % IJ SOLN
INTRAMUSCULAR | Status: DC | PRN
  Administered 2020-01-19: 2 mL

## 2020-01-19 MED ORDER — NITROGLYCERIN 0.4 MG SL SUBL: 0.4000 mg | SUBLINGUAL_TABLET | SUBLINGUAL | Status: DC | PRN

## 2020-01-19 MED ORDER — ASPIRIN EC 81 MG PO TBEC
81.0000 mg | DELAYED_RELEASE_TABLET | Freq: Every day | ORAL | Status: DC
  Administered 2020-01-20: 81 mg via ORAL
  Filled 2020-01-19: qty 1

## 2020-01-19 MED ORDER — NITROGLYCERIN 1 MG/10 ML FOR IR/CATH LAB
INTRA_ARTERIAL | Status: AC
  Filled 2020-01-19: qty 10

## 2020-01-19 MED ORDER — NITROGLYCERIN IN D5W 200-5 MCG/ML-% IV SOLN
5.0000 ug/min | INTRAVENOUS | Status: DC
  Administered 2020-01-19: 5 ug/min via INTRAVENOUS
  Filled 2020-01-19: qty 250

## 2020-01-19 MED ORDER — HEPARIN SODIUM (PORCINE) 1000 UNIT/ML IJ SOLN
INTRAMUSCULAR | Status: DC | PRN
  Administered 2020-01-19: 4000 [IU] via INTRAVENOUS

## 2020-01-19 MED ORDER — SODIUM CHLORIDE 0.9% FLUSH: 3.0000 mL | INTRAVENOUS | Status: DC | PRN

## 2020-01-19 MED ORDER — SODIUM CHLORIDE 0.9% FLUSH
3.0000 mL | Freq: Two times a day (BID) | INTRAVENOUS | Status: DC
  Administered 2020-01-19 – 2020-01-20 (×2): 3 mL via INTRAVENOUS

## 2020-01-19 MED ORDER — HEPARIN BOLUS VIA INFUSION
4000.0000 [IU] | Freq: Once | INTRAVENOUS | Status: AC
  Administered 2020-01-19: 4000 [IU] via INTRAVENOUS
  Filled 2020-01-19: qty 4000

## 2020-01-19 MED ORDER — MIDAZOLAM HCL 2 MG/2ML IJ SOLN
INTRAMUSCULAR | Status: AC
  Filled 2020-01-19: qty 2

## 2020-01-19 MED ORDER — HEPARIN (PORCINE) 25000 UT/250ML-% IV SOLN
950.0000 [IU]/h | INTRAVENOUS | Status: DC
  Administered 2020-01-19: 950 [IU]/h via INTRAVENOUS
  Filled 2020-01-19: qty 250

## 2020-01-19 MED ORDER — SODIUM CHLORIDE 0.9 % IV SOLN: INTRAVENOUS | Status: DC

## 2020-01-19 MED ORDER — HYDRALAZINE HCL 20 MG/ML IJ SOLN: 10.0000 mg | INTRAMUSCULAR | Status: DC | PRN

## 2020-01-19 MED ORDER — ACETAMINOPHEN 325 MG PO TABS: 650.0000 mg | ORAL_TABLET | ORAL | Status: DC | PRN

## 2020-01-19 MED ORDER — HEPARIN (PORCINE) IN NACL 1000-0.9 UT/500ML-% IV SOLN
INTRAVENOUS | Status: DC | PRN
  Administered 2020-01-19 (×2): 500 mL

## 2020-01-19 MED ORDER — SODIUM CHLORIDE 0.9 % IV SOLN: 250.0000 mL | INTRAVENOUS | Status: DC | PRN

## 2020-01-19 MED ORDER — NITROGLYCERIN 1 MG/10 ML FOR IR/CATH LAB
INTRA_ARTERIAL | Status: DC | PRN
  Administered 2020-01-19: 200 ug via INTRACORONARY

## 2020-01-19 MED ORDER — LABETALOL HCL 5 MG/ML IV SOLN: 10.0000 mg | INTRAVENOUS | Status: DC | PRN

## 2020-01-19 MED ORDER — MIDAZOLAM HCL 2 MG/2ML IJ SOLN
INTRAMUSCULAR | Status: DC | PRN
  Administered 2020-01-19: 2 mg via INTRAVENOUS

## 2020-01-19 MED ORDER — FENTANYL CITRATE (PF) 100 MCG/2ML IJ SOLN
INTRAMUSCULAR | Status: DC | PRN
Start: 2020-01-19 — End: 2020-01-19
  Administered 2020-01-19: 25 ug via INTRAVENOUS

## 2020-01-19 MED ORDER — HEPARIN SODIUM (PORCINE) 1000 UNIT/ML IJ SOLN
INTRAMUSCULAR | Status: AC
  Filled 2020-01-19: qty 1

## 2020-01-19 MED ORDER — CARVEDILOL 6.25 MG PO TABS
6.2500 mg | ORAL_TABLET | Freq: Two times a day (BID) | ORAL | Status: DC
  Administered 2020-01-19: 6.25 mg via ORAL
  Filled 2020-01-19: qty 1

## 2020-01-19 MED ORDER — IOHEXOL 350 MG/ML SOLN
INTRAVENOUS | Status: DC | PRN
  Administered 2020-01-19: 80 mL via INTRA_ARTERIAL

## 2020-01-19 MED ORDER — LIDOCAINE HCL (PF) 1 % IJ SOLN
INTRAMUSCULAR | Status: AC
  Filled 2020-01-19: qty 30

## 2020-01-19 SURGICAL SUPPLY — 10 items
CATH OPTITORQUE TIG 4.0 6F (CATHETERS) ×2 IMPLANT
DEVICE RAD COMP TR BAND LRG (VASCULAR PRODUCTS) ×2 IMPLANT
GLIDESHEATH SLEND SS 6F .021 (SHEATH) ×2 IMPLANT
GUIDEWIRE INQWIRE 1.5J.035X260 (WIRE) ×1 IMPLANT
INQWIRE 1.5J .035X260CM (WIRE) ×2
KIT HEART LEFT (KITS) ×2 IMPLANT
PACK CARDIAC CATHETERIZATION (CUSTOM PROCEDURE TRAY) ×2 IMPLANT
SHEATH PROBE COVER 6X72 (BAG) ×2 IMPLANT
TRANSDUCER W/STOPCOCK (MISCELLANEOUS) ×2 IMPLANT
TUBING CIL FLEX 10 FLL-RA (TUBING) ×2 IMPLANT

## 2020-01-19 NOTE — ED Triage Notes (Signed)
Patient with exertional CP for 1 month. When he has the pain he takes aspirin with relief. Denies any associated symptoms

## 2020-01-19 NOTE — Progress Notes (Signed)
ANTICOAGULATION CONSULT NOTE - Follow Up Consult  Pharmacy Consult for Heparin Indication: CAD  Allergies  Allergen Reactions  . Atorvastatin Other (See Comments)    Dr Eden Emms told him not to take it    Patient Measurements: Height: 5\' 6"  (167.6 cm) Weight: 80.7 kg (178 lb) IBW/kg (Calculated) : 63.8 Heparin Dosing Weight:  80.7kg  Vital Signs: Temp: 98.7 F (37.1 C) (08/31 0745) Temp Source: Oral (08/31 0745) BP: 150/72 (08/31 1606) Pulse Rate: 71 (08/31 1606)  Labs: Recent Labs    01/19/20 0755 01/19/20 1023  HGB 14.1  --   HCT 42.5  --   PLT 285  --   CREATININE 1.00  --   TROPONINIHS 88* 109*    Estimated Creatinine Clearance: 72.6 mL/min (by C-G formula based on SCr of 1 mg/dL).  Assessment: CC/HPI: exertional CP  PMH: CAD s/p ruptured plaque in OM in 2003, hypertension, hyperlipidemia and chronic right bundle branch block  Anticoag: NSTEMI with elevated troponins. Cath 8/31. Radial sheath out 1550.  Goal of Therapy:  Heparin level 0.3-0.7 units/ml Monitor platelets by anticoagulation protocol: Yes   Plan:  Resume IV heparin 8hrs post-sheath removal (1550) at 1100 units/hr ( at midnight) Check daily HL and CBC Eval for CABG   Thanvi Blincoe S. 9/31, PharmD, BCPS Clinical Staff Pharmacist [email or web address] Merilynn Finland, Omelia Marquart Stillinger 01/19/2020,4:49 PM

## 2020-01-19 NOTE — ED Provider Notes (Signed)
[facility] EMERGENCY DEPARTMENT Provider Note   CSN: [id number] Arrival date & time: 01/19/20  6606     History No chief complaint on file.   Todd Gonzalez is a 64 y.o. male.  HPI He presents for evaluation of ongoing chest discomfort mid anterior, which feels like "hot pepper."  The pain comes and goes occurring both at rest and with exertion such as mopping the floor or picking up trash.  When he has the pain he sometimes takes Aleve or aspirin, with relief.  He also has some right knee pain and is currently using some prednisone that he had leftover for it.  He has a history of coronary artery disease and prior intervention for that.  He is not routinely seeing cardiology, nor taking any medications for cardiac purposes, other than aspirin.  He denies fever, chills, cough, shortness of breath, nausea, vomiting, weakness or dizziness.  There are no other known modifying factors.    Past Medical History:  Diagnosis Date  . CAD (coronary artery disease)    ruptured plaque in OM 2003  . Headache(784.0)   . HLD (hyperlipidemia)   . HTN (hypertension)   . RBBB (right bundle branch block)     Patient Active Problem List   Diagnosis Date Noted  . ESSENTIAL HYPERTENSION, BENIGN 09/06/2008  . CORONARY ATHEROSCLEROSIS NATIVE CORONARY ARTERY 09/06/2008  . RBBB 09/06/2008  . HEADACHE 09/06/2008    History reviewed. No pertinent surgical history.     No family history on file.  Social History   Tobacco Use  . Smoking status: Current Some Day Smoker    Packs/day: 0.25  . Smokeless tobacco: Never Used  Vaping Use  . Vaping Use: Never used  Substance Use Topics  . Alcohol use: Yes    Comment: occ  . Drug use: No    Home Medications Prior to Admission medications   Medication Sig Start Date End Date Taking? Authorizing Provider  aspirin 325 MG tablet Take 325 mg by mouth daily.     Yes [provider]  naproxen sodium (ANAPROX) 220 MG tablet  Take 220 mg by mouth 2 (two) times daily with a meal.   Yes [provider]  predniSONE (DELTASONE) 5 MG tablet Take 10 mg by mouth daily as needed (knee pain).   Yes [provider]    Allergies    Atorvastatin  Review of Systems   Review of Systems  All other systems reviewed and are negative.   Physical Exam Updated Vital Signs BP 133/83   Pulse 78   Temp 98.7 F (37.1 C) (Oral)   Resp 16   Ht 5\' 6"  (1.676 m)   Wt 80.7 kg   SpO2 100%   BMI 28.73 kg/m   Physical Exam Vitals and nursing note reviewed.  Constitutional:      General: He is not in acute distress.    Appearance: He is well-developed. He is not ill-appearing, toxic-appearing or diaphoretic.  HENT:     Head: Normocephalic and atraumatic.     Right Ear: External ear normal.     Left Ear: External ear normal.  Eyes:     Conjunctiva/sclera: Conjunctivae normal.     Pupils: Pupils are equal, round, and reactive to light.  Neck:     Trachea: Phonation normal.  Cardiovascular:     Rate and Rhythm: Normal rate and regular rhythm.     Heart sounds: Normal heart sounds. No murmur heard.   Pulmonary:  Effort: Pulmonary effort is normal. No respiratory distress.     Breath sounds: Normal breath sounds. No stridor.  Chest:     Chest wall: No tenderness.  Abdominal:     General: There is no distension.     Palpations: Abdomen is soft.     Tenderness: There is no abdominal tenderness.  Musculoskeletal:        General: Normal range of motion.     Cervical back: Normal range of motion and neck supple.  Skin:    General: Skin is warm and dry.  Neurological:     Mental Status: He is alert and oriented to person, place, and time.     Cranial Nerves: No cranial nerve deficit.     Sensory: No sensory deficit.     Motor: No abnormal muscle tone.     Coordination: Coordination normal.  Psychiatric:        Mood and Affect: Mood normal.        Behavior: Behavior normal.        Thought Content:  Thought content normal.        Judgment: Judgment normal.     ED Results / Procedures / Treatments   Labs (all labs ordered are listed, but only abnormal results are displayed) Labs Reviewed  BASIC METABOLIC PANEL - Abnormal; Notable for the following components:      Result Value   CO2 21 (*)    Glucose, Bld 135 (*)    All other components within normal limits  TROPONIN I (HIGH SENSITIVITY) - Abnormal; Notable for the following components:   Troponin I (High Sensitivity) 88 (*)    All other components within normal limits  TROPONIN I (HIGH SENSITIVITY) - Abnormal; Notable for the following components:   Troponin I (High Sensitivity) 109 (*)    All other components within normal limits  SARS CORONAVIRUS 2 BY RT PCR (HOSPITAL ORDER, PERFORMED IN [facility])  CBC WITH DIFFERENTIAL/PLATELET    EKG EKG Interpretation  Date/Time:  Tuesday January 19 2020 07:50:31 EDT Ventricular Rate:  95 PR Interval:    QRS Duration: 112 QT Interval:  368 QTC Calculation: 463 R Axis:   -84 Text Interpretation: Sinus rhythm Atrial premature complex Right atrial enlargement 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 EXAM: PORTABLE CHEST 1 VIEW COMPARISON:  01/18/2013. FINDINGS: Mediastinum and hilar structures normal. Lungs are clear. No pleural effusion or pneumothorax. Heart size stable. Prominent scratched it severe degenerative changes both shoulders. IMPRESSION: No acute cardiopulmonary disease. Electronically Signed   By: Maisie Fus  Register   On: 01/19/2020 08:40    Procedures .Critical Care Performed by: Mancel Bale, MD Authorized by: Mancel Bale, MD   Critical care provider statement:    Critical care time (minutes):  40   Critical care start time:  01/19/2020 8:05 AM   Critical care end time:   01/19/2020 12:41 PM   Critical care time was exclusive of:  Separately billable procedures and treating other patients   Critical care was necessary to treat or prevent imminent or life-threatening deterioration of the following conditions:  Circulatory failure   Critical care was time spent personally by me on the following activities:  Blood draw for specimens, development of treatment plan with patient or surrogate, discussions with consultants, evaluation of patient's response to treatment, examination of  patient, obtaining history from patient or surrogate, ordering and performing treatments and interventions, ordering and review of laboratory studies, pulse oximetry, re-evaluation of patient's condition, review of old charts and ordering and review of radiographic studies   (including critical care time)  Medications Ordered in ED Medications  aspirin chewable tablet 324 mg (has no administration in time range)    ED Course  I have reviewed the triage vital signs and the nursing notes.  Pertinent labs & imaging results that were available during my care of the patient were reviewed by me and considered in my medical decision making (see chart for details).  Clinical Course as of Jan 19 1244  Tue Jan 19, 2020  1215 Normal  CBC with Differential [EW]  1215 Normal except CO2 low, glucose high  Basic metabolic panel(!) [EW]  1215 Initial troponin elevated 88, repeat troponin higher 109.  This is relatively stable abnormal finding.  Troponin I (High Sensitivity)(!!) [EW]  1216 At this time the patient denies any chest discomfort.   [EW]  1237 Case discussed with cardiology they will evaluate treat and admit patient   [EW]    Clinical Course User Index [EW] Mancel Bale, MD   MDM Rules/Calculators/A&P                           Patient Vitals for the past 24 hrs:  BP Temp Temp src Pulse Resp SpO2 Height Weight  01/19/20 1115 133/83 -- -- -- 16 100 % -- --  01/19/20 1100 (!)  146/81 -- -- -- 14 100 % -- --  01/19/20 1045 (!) 158/89 -- -- -- 16 100 % -- --  01/19/20 1015 (!) 148/85 -- -- -- 16 100 % -- --  01/19/20 1000 (!) 148/83 -- -- -- 16 -- -- --  01/19/20 0930 (!) 158/92 -- -- -- 16 -- -- --  01/19/20 0915 (!) 142/86 -- -- -- 17 -- -- --  01/19/20 0845 (!) 144/87 -- -- 78 17 100 % -- --  01/19/20 0815 (!) 152/84 -- -- -- 18 -- -- --  01/19/20 0745 (!) 188/95 98.7 F (37.1 C) Oral 99 18 99 % 5\' 6"  (1.676 m) 80.7 kg    12:16 PM Reevaluation with update and discussion. After initial assessment and treatment, an updated evaluation reveals he is comfortable and pain-free at this time.  Findings cussed with patient all questions were answered.   Medical Decision Making:  This patient is presenting for evaluation of chest pain, which does require a range of treatment options, and is a complaint that involves a high risk of morbidity and mortality. The differential diagnoses include angina, chest wall pain, esophagitis/gastritis. I decided to review old records, and in summary patient with prior cardiac disease with chest pain now, intermittent, occurring at various times including at rest and with exertion.  The pain, is relieved by Aleve or aspirin.  I did not require additional historical information from anyone.  Clinical Laboratory Tests Ordered, included CBC, Metabolic panel and Troponin. Review indicates normal findings with exception of elevated initial troponin, mildly higher, not doubled on delta testing.  Also incidental mild hyperglycemia.. Radiologic Tests Ordered, included chest x-ray.  I independently Visualized: Radiographic images, which show no infiltrate or edema   Critical Interventions-patient with chest pain, history of coronary disease, evaluation consistent with unstable angina.  Patient pain-free in the ED.  After These Interventions, the Patient was reevaluated and was found  to require hospitalization for further care and  treatment.  CRITICAL CARE- yes Performed by: Mancel BaleElliott Kaitelyn Jamison  Nursing Notes Reviewed/ Care Coordinated Applicable Imaging Reviewed Interpretation of Laboratory Data incorporated into ED treatment  Case discussed with cardiology who will evaluate and admit the patient.    Final Clinical Impression(s) / ED Diagnoses Final diagnoses:  Acute coronary syndrome [facility](HCC)    Rx / DC Orders ED Discharge Orders    None       Mancel BaleWentz, Mira Balon, MD 01/19/20 1245

## 2020-01-19 NOTE — ED Notes (Signed)
Date and time results received: 01/19/20  Test: Troponin Critical Value: 109  Name of Provider Notified: Effie Shy

## 2020-01-19 NOTE — Progress Notes (Signed)
TCTS consulted for CABG evaluation. °

## 2020-01-19 NOTE — Interval H&P Note (Signed)
History and Physical Interval Note:  01/19/2020 3:02 PM  Todd Gonzalez  has presented today for surgery, with the diagnosis of unstable angina/nstemi.  The various methods of treatment have been discussed with the patient and family. After consideration of risks, benefits and other options for treatment, the patient has consented to  Procedure(s): LEFT HEART CATH AND CORONARY ANGIOGRAPHY (N/A)  PERCUTANEOUS CORONARY INTERVENTION  as a surgical intervention.  The patient's history has been reviewed, patient examined, no change in status, stable for surgery.  I have reviewed the patient's chart and labs.  Questions were answered to the patient's satisfaction.    Cath Lab Visit (complete for each Cath Lab visit)  Clinical Evaluation Leading to the Procedure:   ACS: Yes.    Non-ACS:    Anginal Classification: CCS III  Anti-ischemic medical therapy: Minimal Therapy (1 class of medications)  Non-Invasive Test Results: No non-invasive testing performed  Prior CABG: No previous CABG   Bryan Lemma

## 2020-01-19 NOTE — Progress Notes (Signed)
ANTICOAGULATION CONSULT NOTE - Initial Consult  Pharmacy Consult for heparin Indication: chest pain/ACS  Allergies  Allergen Reactions  . Atorvastatin Other (See Comments)    Dr Eden Emms told him not to take it    Patient Measurements: Height: 5\' 6"  (167.6 cm) Weight: 80.7 kg (178 lb) IBW/kg (Calculated) : 63.8 Heparin Dosing Weight: TBW  Vital Signs: Temp: 98.7 F (37.1 C) (08/31 0745) Temp Source: Oral (08/31 0745) BP: 133/83 (08/31 1115) Pulse Rate: 78 (08/31 0845)  Labs: Recent Labs    01/19/20 0755 01/19/20 1023  HGB 14.1  --   HCT 42.5  --   PLT 285  --   CREATININE 1.00  --   TROPONINIHS 88* 109*    Estimated Creatinine Clearance: 72.6 mL/min (by C-G formula based on SCr of 1 mg/dL).   Medical History: Past Medical History:  Diagnosis Date  . CAD (coronary artery disease)    ruptured plaque in OM 2003  . Headache(784.0)   . HLD (hyperlipidemia)   . HTN (hypertension)   . RBBB (right bundle branch block)    Assessment: 64 YOM presenting with CP at rest + on exertion, elevated troponin.  Not on anticoagulation PTA, CBC wnl.  Goal of Therapy:  Heparin level 0.3-0.7 units/ml Monitor platelets by anticoagulation protocol: Yes   Plan:  Heparin 4000 units IV x 1, and gtt at 950 units/hr F/u 6 hour heparin level  2004, PharmD Clinical Pharmacist ED Pharmacist Phone # [phone] 01/19/2020 12:44 PM

## 2020-01-19 NOTE — H&P (Addendum)
Cardiology Admission History and Physical:   Patient ID: Todd Gonzalez MRN: [id number]; DOB: Aug 23, 1955   Admission date: 01/19/2020  Primary Care Provider: Erlinda Hong, MD [facility] Cardiologist: Todd Gonzalez  Chief Complaint:  Chest pain   Patient Profile:   Todd Gonzalez is a 64 y.o. male with past medical history of CAD s/p ruptured plaque in OM in 2003, hypertension, hyperlipidemia and chronic right bundle branch block presented for chest pain evaluation and found to have elevated troponin.  History of rupture plaque in his OM branch in 2003.  Seen by Dr. Eden Gonzalez in 2010 and his Plavix was discontinued.  No cardiology follow-up since then.  He does not have a PCP.  History of Present Illness:   [name] has 1 to 64-month history of exertional chest pain.  He describes his pain as a "hot pepper pressure".  Located centrally.  No associated shortness of breath, diaphoresis, nausea or radiation.  He is pain occurs with minimal exertion and resolves within 10 minutes to hours.  This morning he woke up from sleep at 5 AM with worse symptoms.  It lasted for at least 2 hours and took aspirin x2 with resolution of symptoms.  No associated symptoms.  Due to worse episode he came to ER for further evaluation.  High-sensitivity troponin 88>> 109.  Glucose 135.  Electrolytes and creatinine are normal.  Hemoglobin 14.1.  Chest x-ray without acute issue.  Pending Covid.  He only takes aspirin 81 mg daily.  Denies illicit drug use or tobacco smoking.  Denies orthopnea, PND, dizziness, lower extremity edema, syncope or melena.  Denies Covid exposure.  No fever, chills, cough or congestion.  He has been received 2 dose of Covid vaccine.   Past Medical History:  Diagnosis Date  . CAD (coronary artery disease)    ruptured plaque in OM 2003  . Headache(784.0)   . HLD (hyperlipidemia)   . HTN (hypertension)   . RBBB (right bundle branch block)     Medications Prior to Admission: Prior to  Admission medications   Medication Sig Start Date End Date Taking? Authorizing Provider  aspirin 325 MG tablet Take 325 mg by mouth daily.     Yes [provider]  naproxen sodium (ANAPROX) 220 MG tablet Take 220 mg by mouth 2 (two) times daily with a meal.   Yes [provider]  predniSONE (DELTASONE) 5 MG tablet Take 10 mg by mouth daily as needed (knee pain).   Yes [provider]     Allergies:    Allergies  Allergen Reactions  . Atorvastatin Other (See Comments)    Dr Todd Gonzalez told him not to take it    Social History:   Social History   Socioeconomic History  . Marital status: Married    Spouse name: Not on file  . Number of children: Not on file  . Years of education: Not on file  . Highest education level: Not on file  Occupational History  . Not on file  Tobacco Use  . Smoking status: Current Some Day Smoker    Packs/day: 0.25  . Smokeless tobacco: Never Used  Vaping Use  . Vaping Use: Never used  Substance and Sexual Activity  . Alcohol use: Yes    Comment: occ  . Drug use: No  . Sexual activity: Not on file  Other Topics Concern  . Not on file  Social History Narrative   Not working; helps out at a [employer].  Lives with friend Todd Gonzalez; has a girlfriend.    Social Determinants of Health   Financial Resource Strain:   . Difficulty of Paying Living Expenses: Not on file  Food Insecurity:   . Worried About Programme researcher, broadcasting/film/video in the Last Year: Not on file  . Ran Out of Food in the Last Year: Not on file  Transportation Needs:   . Lack of Transportation (Medical): Not on file  . Lack of Transportation (Non-Medical): Not on file  Physical Activity:   . Days of Exercise per Week: Not on file  . Minutes of Exercise per Session: Not on file  Stress:   . Feeling of Stress : Not on file  Social Connections:   . Frequency of Communication with Friends and Family: Not on file  . Frequency of Social Gatherings with Friends and  Family: Not on file  . Attends Religious Services: Not on file  . Active Member of Clubs or Organizations: Not on file  . Attends Banker Meetings: Not on file  . Marital Status: Not on file  Intimate Partner Violence:   . Fear of Current or Ex-Partner: Not on file  . Emotionally Abused: Not on file  . Physically Abused: Not on file  . Sexually Abused: Not on file    Family History:   The patient's family history is not on file.    Patient denies family history of CAD  ROS:  Please see the history of present illness.  All other ROS reviewed and negative.     Physical Exam/Data:   Vitals:   01/19/20 1100 01/19/20 1115 01/19/20 1215 01/19/20 1230  BP: (!) 146/81 133/83 (!) 145/81 (!) 153/88  Pulse:      Resp: 14 16 16 19   Temp:      TempSrc:      SpO2: 100% 100%    Weight:      Height:       No intake or output data in the 24 hours ending 01/19/20 1307 Last 3 Weights 01/19/2020 04/30/2018 03/30/2013  Weight (lbs) 178 lb 170 lb 164 lb  Weight (kg) 80.74 kg 77.111 kg 74.39 kg     Body mass index is 28.73 kg/m.  General:  Well nourished, well developed, in no acute distress HEENT: normal Lymph: no adenopathy Neck: no JVD Endocrine:  No thryomegaly Vascular: No carotid bruits; FA pulses 2+ bilaterally without bruits  Cardiac:  normal S1, S2; RRR; no murmur  Lungs:  clear to auscultation bilaterally, no wheezing, rhonchi or rales  Abd: soft, nontender, no hepatomegaly  Ext: no edema Musculoskeletal:  No deformities, BUE and BLE strength normal and equal Skin: warm and dry  Neuro:  CNs 2-12 intact, no focal abnormalities noted Psych:  Normal affect    EKG:  The ECG that was done today was personally reviewed and demonstrates sinus rhythm at rate of 95 bpm, right bundle branch block, PAC, non specific ST/T wave changes inferiorly   Relevant CV Studies: As above  Laboratory Data:  High Sensitivity Troponin:   Recent Labs  Lab 01/19/20 0755  01/19/20 1023  TROPONINIHS 88* 109*      Chemistry Recent Labs  Lab 01/19/20 0755  NA 138  K 4.0  CL 103  CO2 21*  GLUCOSE 135*  BUN 17  CREATININE 1.00  CALCIUM 9.0  GFRNONAA >60  GFRAA >60  ANIONGAP 14    Hematology Recent Labs  Lab 01/19/20 0755  WBC 8.1  RBC 4.73  HGB  14.1  HCT 42.5  MCV 89.9  MCH 29.8  MCHC 33.2  RDW 14.5  PLT 285    Radiology/Studies:  DG Chest Port 1 View  Result Date: 01/19/2020 CLINICAL DATA:  Chest pain. EXAM: PORTABLE CHEST 1 VIEW COMPARISON:  01/18/2013. FINDINGS: Mediastinum and hilar structures normal. Lungs are clear. No pleural effusion or pneumothorax. Heart size stable. Prominent scratched it severe degenerative changes both shoulders. IMPRESSION: No acute cardiopulmonary disease. Electronically Signed   By: Maisie Fushomas  Register   On: 01/19/2020 08:40   [id number]} TIMI Risk Score for Unstable Angina or Non-ST Elevation MI:   The patient's TIMI risk score is 6, which indicates a 41% risk of all cause mortality, Todd Gonzalez or recurrent myocardial infarction or need for urgent revascularization in the next 14 days.   Assessment and Plan:   1. Non-STEMI -Patient with 1 to 442-months history of exertional chest discomfort which he described as "hot paper pressure" without associated symptoms.  This morning he had a worse episode of waking him from sleep.  Symptoms resolved after aspirin x2.  Patient reports abdominal pain with prior history of ruptured plaque in OM. - Hs- troponin 88>>109 - EKG with T/ST inversion inferiorly  -Aspirin 324 mg given in ER -Continue IV heparin -Lipid panel and hemoglobin A1c -Cycle enzymes -Likely cardiac cath later today  2.  Hypertension -Unknown not in any antihypertensive regimen -Start beta-blocker  Severity of Illness: The appropriate patient status for this patient is OBSERVATION. Observation status is judged to be reasonable and necessary in order to provide the required intensity of service to ensure  the patient's safety. The patient's presenting symptoms, physical exam findings, and initial radiographic and laboratory data in the context of their medical condition is felt to place them at decreased risk for further clinical deterioration. Furthermore, it is anticipated that the patient will be medically stable for discharge from the hospital within 2 midnights of admission. The following factors support the patient status of observation.   " The patient's presenting symptoms include Chest discomfort. " The physical exam findings include None " The initial radiographic and laboratory data are elevated troponin and abnormal EKG     For questions or updates, please contact [facility] Please consult [email or web address] for contact info under     Lorelei PontSigned, Bhavinkumar Bhagat, PA  01/19/2020 1:07 PM   I have personally seen and examined this patient. I agree with the assessment and plan as outlined above.  64 yo male with history of CAD, HTN, HLD presenting with chest pain. Known CAD. Report of OM plaque rupture in 2003. No follow up since 2010 in our office (he saw Dr. Eden EmmsNishan in 2010). Chest pain has been occurring for 2 months. Lasts 10 minutes. This woke him from his sleep today. No chest pain currently. Labs reviewed by me. HS Troponin with mild elevation.  EKG reviewed by me shows sinus with RBBB, T wave inversion inferior leads My exam:  General: Well developed, well nourished, NAD  HEENT: OP clear, mucus membranes moist  SKIN: warm, dry. No rashes. Neuro: No focal deficits  Musculoskeletal: Muscle strength 5/5 all ext  Psychiatric: Mood and affect normal  Neck: No JVD, no carotid bruits, no thyromegaly, no lymphadenopathy.  Lungs:Clear bilaterally, no wheezes, rhonci, crackles Cardiovascular: Regular rate and rhythm. No murmurs, gallops or rubs. Abdomen:Soft. Bowel sounds present. Non-tender.  Extremities: No lower extremity edema. Pulses are 2 + in the bilateral DP/PT.  Plan: CAD  with unstable angina/NSTEMI: Will plan to admit to  telemetry. Cardiac cath later today with probable PCI. Start IV heparin. Will start a statin and beta blocker.   I have reviewed the risks, indications, and alternatives to cardiac catheterization, possible angioplasty, and stenting with the patient. Risks include but are not limited to bleeding, infection, vascular injury, stroke, myocardial infection, arrhythmia, kidney injury, radiation-related injury in the case of prolonged fluoroscopy use, emergency cardiac surgery, and death. The patient understands the risks of serious complication is 1-2 in 1000 with diagnostic cardiac cath and 1-2% or less with angioplasty/stenting.  Verne Carrow 01/19/2020 1:39 PM

## 2020-01-20 ENCOUNTER — Inpatient Hospital Stay (HOSPITAL_COMMUNITY): Payer: Medicaid Other

## 2020-01-20 ENCOUNTER — Encounter (HOSPITAL_COMMUNITY): Payer: Self-pay | Admitting: Cardiology

## 2020-01-20 DIAGNOSIS — I25110 Atherosclerotic heart disease of native coronary artery with unstable angina pectoris: Secondary | ICD-10-CM

## 2020-01-20 DIAGNOSIS — Z01810 Encounter for preprocedural cardiovascular examination: Secondary | ICD-10-CM

## 2020-01-20 DIAGNOSIS — R079 Chest pain, unspecified: Secondary | ICD-10-CM

## 2020-01-20 DIAGNOSIS — I214 Non-ST elevation (NSTEMI) myocardial infarction: Secondary | ICD-10-CM

## 2020-01-20 LAB — ECHOCARDIOGRAM COMPLETE
Area-P 1/2: 3.31 cm2
Height: 66 in
S' Lateral: 3.5 cm
Weight: 2513.6 oz

## 2020-01-20 LAB — BASIC METABOLIC PANEL
Anion gap: 10 (ref 5–15)
BUN: 12 mg/dL (ref 8–23)
CO2: 24 mmol/L (ref 22–32)
Calcium: 8.5 mg/dL — ABNORMAL LOW (ref 8.9–10.3)
Chloride: 104 mmol/L (ref 98–111)
Creatinine, Ser: 0.89 mg/dL (ref 0.61–1.24)
GFR calc Af Amer: >60 mL/min (ref >60)
GFR calc non Af Amer: >60 mL/min (ref >60)
Glucose, Bld: 93 mg/dL (ref 70–99)
Potassium: 3.7 mmol/L (ref 3.5–5.1)
Sodium: 138 mmol/L (ref 135–145)

## 2020-01-20 LAB — CBC
HCT: 39 % (ref 39.0–52.0)
Hemoglobin: 12.9 g/dL — ABNORMAL LOW (ref 13.0–17.0)
MCH: 29.9 pg (ref 26.0–34.0)
MCHC: 33.1 g/dL (ref 30.0–36.0)
MCV: 90.3 fL (ref 80.0–100.0)
Platelets: 237 10*3/uL (ref 150–400)
RBC: 4.32 MIL/uL (ref 4.22–5.81)
RDW: 14.5 % (ref 11.5–15.5)
WBC: 5.6 10*3/uL (ref 4.0–10.5)
nRBC: 0 % (ref 0.0–0.2)

## 2020-01-20 LAB — LIPID PANEL
Cholesterol: 206 mg/dL — ABNORMAL HIGH (ref 0–200)
HDL: 62 mg/dL (ref >40)
LDL Cholesterol: 125 mg/dL — ABNORMAL HIGH (ref 0–99)
Total CHOL/HDL Ratio: 3.3 RATIO
Triglycerides: 95 mg/dL (ref <150)
VLDL: 19 mg/dL (ref 0–40)

## 2020-01-20 LAB — HEMOGLOBIN A1C
Hgb A1c MFr Bld: 6.1 % — ABNORMAL HIGH (ref 4.8–5.6)
Mean Plasma Glucose: 128.37 mg/dL

## 2020-01-20 LAB — PULMONARY FUNCTION TEST
FEF 25-75 Pre: 2.49 L/sec
FEF2575-%Pred-Pre: 108 %
FEV1-%Pred-Pre: 72 %
FEV1-Pre: 1.83 L
FEV1FVC-%Pred-Pre: 117 %
FEV6-%Pred-Pre: 64 %
FEV6-Pre: 2.03 L
FEV6FVC-%Pred-Pre: 104 %
FVC-%Pred-Pre: 61 %
FVC-Pre: 2.03 L
Pre FEV1/FVC ratio: 90 %
Pre FEV6/FVC Ratio: 100 %

## 2020-01-20 LAB — TSH: TSH: 2.698 u[IU]/mL (ref 0.350–4.500)

## 2020-01-20 LAB — HIV ANTIBODY (ROUTINE TESTING W REFLEX): HIV Screen 4th Generation wRfx: NONREACTIVE

## 2020-01-20 LAB — HEPARIN LEVEL (UNFRACTIONATED): Heparin Unfractionated: 0.5 IU/mL (ref 0.30–0.70)

## 2020-01-20 LAB — ABO/RH: ABO/RH(D): A POS

## 2020-01-20 MED ORDER — NOREPINEPHRINE 4 MG/250ML-% IV SOLN
0.0000 ug/min | INTRAVENOUS | Status: DC
  Filled 2020-01-20: qty 250

## 2020-01-20 MED ORDER — POTASSIUM CHLORIDE 2 MEQ/ML IV SOLN
80.0000 meq | INTRAVENOUS | Status: DC
  Filled 2020-01-20: qty 40

## 2020-01-20 MED ORDER — TRANEXAMIC ACID (OHS) BOLUS VIA INFUSION
15.0000 mg/kg | INTRAVENOUS | Status: AC
  Administered 2020-01-21: 1069.5 mg via INTRAVENOUS
  Filled 2020-01-20: qty 1070

## 2020-01-20 MED ORDER — BISACODYL 5 MG PO TBEC
5.0000 mg | DELAYED_RELEASE_TABLET | Freq: Once | ORAL | Status: AC
  Administered 2020-01-20: 5 mg via ORAL
  Filled 2020-01-20: qty 1

## 2020-01-20 MED ORDER — SODIUM CHLORIDE 0.9 % IV SOLN
750.0000 mg | INTRAVENOUS | Status: AC
  Administered 2020-01-21: 750 mg via INTRAVENOUS
  Filled 2020-01-20: qty 750

## 2020-01-20 MED ORDER — CARVEDILOL 6.25 MG PO TABS
6.2500 mg | ORAL_TABLET | Freq: Two times a day (BID) | ORAL | Status: DC
  Administered 2020-01-20: 6.25 mg via ORAL
  Filled 2020-01-20: qty 1

## 2020-01-20 MED ORDER — NITROGLYCERIN IN D5W 200-5 MCG/ML-% IV SOLN
2.0000 ug/min | INTRAVENOUS | Status: DC
  Filled 2020-01-20: qty 250

## 2020-01-20 MED ORDER — INSULIN REGULAR(HUMAN) IN NACL 100-0.9 UT/100ML-% IV SOLN
INTRAVENOUS | Status: AC
  Administered 2020-01-21: .9 [IU]/h via INTRAVENOUS
  Filled 2020-01-20: qty 100

## 2020-01-20 MED ORDER — EPINEPHRINE HCL 5 MG/250ML IV SOLN IN NS
0.0000 ug/min | INTRAVENOUS | Status: DC
  Filled 2020-01-20: qty 250

## 2020-01-20 MED ORDER — MILRINONE LACTATE IN DEXTROSE 20-5 MG/100ML-% IV SOLN
0.3000 ug/kg/min | INTRAVENOUS | Status: DC
  Filled 2020-01-20: qty 100

## 2020-01-20 MED ORDER — PLASMA-LYTE 148 IV SOLN
INTRAVENOUS | Status: DC
  Filled 2020-01-20: qty 2.5

## 2020-01-20 MED ORDER — TRANEXAMIC ACID 1000 MG/10ML IV SOLN
1.5000 mg/kg/h | INTRAVENOUS | Status: AC
  Administered 2020-01-21: 1.5 mg/kg/h via INTRAVENOUS
  Filled 2020-01-20: qty 25

## 2020-01-20 MED ORDER — SODIUM CHLORIDE 0.9 % IV SOLN
1.5000 g | INTRAVENOUS | Status: AC
  Administered 2020-01-21: 1.5 g via INTRAVENOUS
  Filled 2020-01-20: qty 1.5

## 2020-01-20 MED ORDER — SODIUM CHLORIDE 0.9 % IV SOLN
INTRAVENOUS | Status: DC
  Filled 2020-01-20: qty 30

## 2020-01-20 MED ORDER — TRANEXAMIC ACID (OHS) PUMP PRIME SOLUTION
2.0000 mg/kg | INTRAVENOUS | Status: DC
  Filled 2020-01-20: qty 1.43

## 2020-01-20 MED ORDER — VANCOMYCIN HCL 1250 MG/250ML IV SOLN
1250.0000 mg | INTRAVENOUS | Status: AC
  Administered 2020-01-21: 1250 mg via INTRAVENOUS
  Filled 2020-01-20: qty 250

## 2020-01-20 MED ORDER — PHENYLEPHRINE HCL-NACL 20-0.9 MG/250ML-% IV SOLN
30.0000 ug/min | INTRAVENOUS | Status: AC
  Administered 2020-01-21: 35 ug/min via INTRAVENOUS
  Filled 2020-01-20: qty 250

## 2020-01-20 MED ORDER — DEXMEDETOMIDINE HCL IN NACL 400 MCG/100ML IV SOLN
0.1000 ug/kg/h | INTRAVENOUS | Status: AC
  Administered 2020-01-21: .3 ug/kg/h via INTRAVENOUS
  Filled 2020-01-20: qty 100

## 2020-01-20 MED ORDER — MAGNESIUM SULFATE 50 % IJ SOLN
40.0000 meq | INTRAMUSCULAR | Status: DC
  Filled 2020-01-20: qty 9.85

## 2020-01-20 NOTE — Consult Note (Signed)
[address]       [place] [address]             [phone]        [facility] Medical Record #[id number] Date of Birth: April 24, 1956  Referring: No ref. provider found Primary Care: Erlinda Hong, MD Primary Cardiologist:Christopher Clifton James, MD  Chief Complaint:   Chest pain   History of Present Illness:      Mr. Word is a 64 year old male patient with a past medical history of hypertension, known CAD with previous ruptured plaque in the OM in 2003,  right BBB, hyperlipidemia and headaches who presented  to the [facility] ED yesterday with a 1-2 month history of exertional chest pain. He did not have any associated symptoms. The pain can be reproduced with minimal exertion and usually subsides in 10 minutes to hours. On the morning of 01/19/2020 he woke up at 5am from the pain and took 2 baby aspirin with resolution of symptoms after about 2 hours. Due to risk factors and known CAD he underwent cardiac catheterization which showed: 30% stenosis of the mid to distal left main, 70% stenosis of the ostial to mid circumflex, 60% stenosis of the side branch in the first marginal, 95% stenosis of the mid to distal circumflex which was initially 100% occluded but opened up somewhat with injections, 70% stenosis of the mid LAD lesion with a 70% stenosed side branch and second diagonal, and 75% stenosis of the distal LAD.  The estimated left ventricular ejection fraction is 55 to 65% by visual estimate. IV heparin was initiated. We were consulted for possible surgical coronary revascularization.   Of note this patient received both doses of the COVID19 vaccine.   Current Activity/ Functional Status: Patient was independent with mobility/ambulation, transfers, ADL's, IADL's.   Zubrod Score: At the time of surgery this patient's most appropriate activity status/level should be described as: []     0    Normal activity, no symptoms [x]     1    Restricted in  physical strenuous activity but ambulatory, able to do out light work []     2    Ambulatory and capable of self care, unable to do work activities, up and about                 more than 50%  Of the time                            []     3    Only limited self care, in bed greater than 50% of waking hours []     4    Completely disabled, no self care, confined to bed or chair []     5    Moribund  Past Medical History:  Diagnosis Date  . CAD (coronary artery disease)    ruptured plaque in OM 2003  . Headache(784.0)   . HLD (hyperlipidemia)   . HTN (hypertension)   . RBBB (right bundle branch block)     Past Surgical History:  Procedure Laterality Date  . LEFT HEART CATH AND CORONARY ANGIOGRAPHY N/A 01/19/2020   Procedure: LEFT HEART CATH AND CORONARY ANGIOGRAPHY;  Surgeon: , MD;  Location: [facility] INVASIVE CV LAB;  Service: Cardiovascular;  Laterality: N/A;    Social History   Tobacco Use  Smoking Status Current Some Day Smoker  . Packs/day: 0.25  Smokeless Tobacco Never Used    Social History   Substance and Sexual Activity  Alcohol Use Yes   Comment: occ     Allergies  Allergen Reactions  . Atorvastatin Other (See Comments)    Dr Eden Emms told him not to take it    Current Facility-Administered Medications  Medication Dose Route Frequency Provider Last Rate Last Admin  . 0.9 %  sodium chloride infusion  250 mL Intravenous PRN Marykay Lex, MD      . acetaminophen (TYLENOL) tablet 650 mg  650 mg Oral Q4H PRN Marykay Lex, MD      . aspirin EC tablet 81 mg  81 mg Oral Daily Bhagat, Bhavinkumar, [place]      . carvedilol (COREG) tablet 6.25 mg  6.25 mg Oral BID WC Kathleene Hazel, MD      . Melene Muller ON 01/21/2020] cefUROXime (ZINACEF) 1.5 g in sodium chloride 0.9 % 100 mL IVPB  1.5 g Intravenous To OR [name], MD      . Melene Muller ON 01/21/2020] cefUROXime (ZINACEF) 750 mg in sodium chloride 0.9 % 100 mL IVPB  750 mg Intravenous To OR Atkins, Merri Brunette, MD      . Melene Muller ON 01/21/2020] dexmedetomidine (PRECEDEX) 400 MCG/100ML (4 mcg/mL) infusion  0.1-0.7 mcg/kg/hr Intravenous To OR [name], MD      . Melene Muller ON 01/21/2020] EPINEPHrine (ADRENALIN) 4 mg in NS 250 mL (0.016 mg/mL) premix infusion  0-10 mcg/min Intravenous To OR [name], MD      . Melene Muller ON 01/21/2020] heparin 30,000 units/NS 1000 mL solution for CELLSAVER   Other To OR Atkins, Merri Brunette, MD      . heparin ADULT infusion 100 units/mL (25000 units/241mL sodium chloride 0.45%)  1,100 Units/hr Intravenous Continuous Norva Pavlov, RPH 11 mL/hr at 01/20/20 0034 1,100 Units/hr at 01/20/20 0034  . [START ON 01/21/2020] heparin sodium (porcine) 2,500 Units, papaverine 30 mg in electrolyte-148 (PLASMALYTE-148) 500 mL irrigation   Irrigation To OR Atkins, Merri Brunette, MD      . Melene Muller ON 01/21/2020] insulin regular, human (MYXREDLIN) 100 units/ 100 mL infusion   Intravenous To OR Atkins, Merri Brunette, MD      . Melene Muller ON 01/21/2020] magnesium sulfate (IV Push/IM) injection 40 mEq  40 mEq Other To OR Atkins, Merri Brunette, MD      . Melene Muller ON 01/21/2020] milrinone (PRIMACOR) 20 MG/100 ML (0.2 mg/mL) infusion  0.3 mcg/kg/min Intravenous To OR Atkins, Merri Brunette, MD      . nitroGLYCERIN (NITROSTAT) SL tablet 0.4 mg  0.4 mg Sublingual Q5 Min x 3 PRN Bhagat, Bhavinkumar, PA      . nitroGLYCERIN 50 mg in dextrose 5 % 250 mL (0.2 mg/mL) infusion  5 mcg/min Intravenous Titrated Marykay Lex, MD 1.5 mL/hr at 01/19/20 2038 5 mcg/min at 01/19/20 2038  . [START ON 01/21/2020] nitroGLYCERIN 50 mg in dextrose 5 % 250 mL (0.2 mg/mL) infusion  2-200 mcg/min Intravenous To OR Atkins, Merri Brunette, MD      . Melene Muller ON 01/21/2020] norepinephrine (LEVOPHED) 4mg  in premix infusion  0-40 mcg/min Intravenous To OR Atkins, , MD      . ondansetron (ZOFRAN) injection 4 mg  4 mg Intravenous Q6H PRN Merri Brunette, MD      . Marykay Lex ON 01/21/2020] phenylephrine (NEOSYNEPHRINE) 20-0.9 MG/250ML-% infusion   30-200 mcg/min Intravenous To OR 03/22/2020, MD      . [name] ON 01/21/2020]  potassium chloride injection 80 mEq  80 mEq Other To OR Atkins, Broadus Z, MD      . sodium chloride flush (NS) 0.9 % injection 3 mL  3 mL Intravenous Q12H Marykay Lex, MD   3 mL at 01/19/20 2259  . sodium chloride flush (NS) 0.9 % injection 3 mL  3 mL Intravenous PRN Marykay Lex, MD      . Melene Muller ON 01/21/2020] tranexamic acid (CYKLOKAPRON) 2,500 mg in sodium chloride 0.9 % 250 mL (10 mg/mL) infusion  1.5 mg/kg/hr Intravenous To OR [name], MD      . Melene Muller ON 01/21/2020] tranexamic acid (CYKLOKAPRON) bolus via infusion - over 30 minutes 1,069.5 mg  15 mg/kg Intravenous To OR [name], MD      . Melene Muller ON 01/21/2020] tranexamic acid (CYKLOKAPRON) pump prime solution 143 mg  2 mg/kg Intracatheter To OR Atkins, Merri Brunette, MD      . Melene Muller ON 01/21/2020] vancomycin (VANCOREADY) IVPB 1250 mg/250 mL  1,250 mg Intravenous To OR Atkins, Merri Brunette, MD        Medications Prior to Admission  Medication Sig Dispense Refill Last Dose  . aspirin 325 MG tablet Take 325 mg by mouth daily.     01/18/2020 at Unknown time  . naproxen sodium (ANAPROX) 220 MG tablet Take 220 mg by mouth 2 (two) times daily with a meal.   01/18/2020 at Unknown time  . predniSONE (DELTASONE) 5 MG tablet Take 10 mg by mouth daily as needed (knee pain).   UNK    No family history on file.   Review of Systems:   Review of Systems  Constitutional: Negative for fever.  Respiratory: Negative for cough and shortness of breath.   Cardiovascular: Negative for chest pain and leg swelling.  Gastrointestinal: Negative.   Musculoskeletal: Negative.    Pertinent items are noted in HPI.   Physical Exam: BP 110/73 (BP Location: Right Arm)   Pulse 60   Temp 97.7 F (36.5 C) (Oral)   Resp 13   Ht  (1.676 m)   Wt 71.3 kg Comment: scale b  SpO2 98%   BMI 25.36 kg/m    General appearance: alert, cooperative and no  distress Resp: clear to auscultation bilaterally Cardio: regular rate and rhythm, S1, S2 normal, no murmur, click, rub or gallop GI: soft, non-tender; bowel sounds normal; no masses,  no organomegaly Extremities: extremities normal, atraumatic, no cyanosis or edema Neurologic: Grossly normal  Diagnostic Studies & Laboratory data:     Recent Radiology Findings:   CARDIAC CATHETERIZATION  Result Date: 01/19/2020  Mid LM to Dist LM lesion is 30% stenosed.  Ost Cx to Mid Cx lesion is 70% stenosed with 60% stenosed side branch in 1st Mrg.  CULPRIT LESION mid Cx to Dist Cx lesion is 95% stenosed -was initially 100% occluded, opened up somewhat with injections.  Mid LAD lesion is 70% stenosed with 70% stenosed side branch in 2nd Diag.  Dist LAD lesion is 75% stenosed.  ------------------------------  The left ventricular systolic function is normal. The left ventricular ejection fraction is 55-65% by visual estimate.  LV end diastolic pressure is normal.  SUMMARY  Severe multivessel disease:  Diffuse ostial proximal LCx disease involving takeoff of 1st Mrg with initial total occlusion prior to small caliber diffusely diseased 2nd Mrg (now 90% ulcerated) -  Follow-on AV groove LCx gives off 3 additional posterior lateral branches distally.  Diffuse proximal to mid LAD 65% stenosis followed  by normalization and then distal-apical tapering to 70% stenosis.  Small nondominant RCA  Normal EF and EDP. RECOMMENDATIONS  Admit overnight, restart IV heparin  CVTS consultation (images reviewed with Dr. Clifton James.  Would be difficult PCI of the LCx with bifurcation lesion and then extensive LAD lesions.  Continue to titrate GUIDELINE DIRECTED MEDICAL THERAPY Bryan Lemma, MD  DG Chest [facility] 1 View  Result Date: 01/19/2020 CLINICAL DATA:  Chest pain. EXAM: PORTABLE CHEST 1 VIEW COMPARISON:  01/18/2013. FINDINGS: Mediastinum and hilar structures normal. Lungs are clear. No pleural effusion or  pneumothorax. Heart size stable. Prominent scratched it severe degenerative changes both shoulders. IMPRESSION: No acute cardiopulmonary disease. Electronically Signed   By: Maisie Fus  Register   On: 01/19/2020 08:40   VAS US DOPPLER PRE CABG  Result Date: 01/20/2020 PREOPERATIVE VASCULAR EVALUATION  Indications:      Pre-CABG. Risk Factors:     Hypertension, hyperlipidemia, coronary artery disease. Comparison Study: no prior Performing Technologist: Blanch Media RVS  Examination Guidelines: A complete evaluation includes B-mode imaging, spectral Doppler, color Doppler, and power Doppler as needed of all accessible portions of each vessel. Bilateral testing is considered an integral part of a complete examination. Limited examinations for reoccurring indications may be performed as noted.  Right Carotid Findings: +----------+--------+--------+--------+------------+--------+           PSV cm/sEDV cm/sStenosisDescribe    Comments +----------+--------+--------+--------+------------+--------+ CCA Prox  85      18              heterogenous         +----------+--------+--------+--------+------------+--------+ CCA Distal81      23              heterogenous         +----------+--------+--------+--------+------------+--------+ ICA Prox  82      26      1-39%   heterogenous         +----------+--------+--------+--------+------------+--------+ ICA Distal96      34                                   +----------+--------+--------+--------+------------+--------+ ECA       90      10                                   +----------+--------+--------+--------+------------+--------+ Portions of this table do not appear on this page. +----------+--------+-------+--------+------------+           PSV cm/sEDV cmsDescribeArm Pressure +----------+--------+-------+--------+------------+ Subclavian96      55                          +----------+--------+-------+--------+------------+  +---------+--------+--+--------+--+---------+ VertebralPSV cm/s47EDV cm/s16Antegrade +---------+--------+--+--------+--+---------+ Left Carotid Findings: +----------+--------+--------+--------+------------+--------+           PSV cm/sEDV cm/sStenosisDescribe    Comments +----------+--------+--------+--------+------------+--------+ CCA Prox  129     26              heterogenous         +----------+--------+--------+--------+------------+--------+ CCA Distal72      19              heterogenous         +----------+--------+--------+--------+------------+--------+ ICA Prox  82      27      1-39%   heterogenous         +----------+--------+--------+--------+------------+--------+  ICA Distal87      33                                   +----------+--------+--------+--------+------------+--------+ ECA       111     18                                   +----------+--------+--------+--------+------------+--------+ +----------+--------+--------+--------+------------+ SubclavianPSV cm/sEDV cm/sDescribeArm Pressure +----------+--------+--------+--------+------------+           93                                   +----------+--------+--------+--------+------------+ +---------+--------+--+--------+-+---------+ VertebralPSV cm/s33EDV cm/s5Antegrade +---------+--------+--+--------+-+---------+  ABI Findings: +--------+------------------+-----+---------+--------+ Right   Rt Pressure (mmHg)IndexWaveform Comment  +--------+------------------+-----+---------+--------+ Brachial110                    triphasic         +--------+------------------+-----+---------+--------+ ATA                            triphasic         +--------+------------------+-----+---------+--------+ PTA                            triphasic         +--------+------------------+-----+---------+--------+ +--------+------------------+-----+---------+-------+ Left    Lt  Pressure (mmHg)IndexWaveform Comment +--------+------------------+-----+---------+-------+ Brachial                       triphasic        +--------+------------------+-----+---------+-------+ ATA                            triphasic        +--------+------------------+-----+---------+-------+ PTA                            triphasic        +--------+------------------+-----+---------+-------+  Right Doppler Findings: +--------+--------+-----+---------+--------+ Site    PressureIndexDoppler  Comments +--------+--------+-----+---------+--------+ [id number]          triphasic         +--------+--------+-----+---------+--------+ Radial               triphasic         +--------+--------+-----+---------+--------+ Ulnar                triphasic         +--------+--------+-----+---------+--------+  Left Doppler Findings: +--------+--------+-----+---------+--------+ Site    PressureIndexDoppler  Comments +--------+--------+-----+---------+--------+ Brachial             triphasic         +--------+--------+-----+---------+--------+ Radial               triphasic         +--------+--------+-----+---------+--------+ Ulnar                triphasic         +--------+--------+-----+---------+--------+  Summary: Right Carotid: Velocities in the right ICA are consistent with a 1-39% stenosis. Left Carotid: Velocities in the left ICA are consistent with a 1-39% stenosis. Vertebrals: Bilateral vertebral arteries demonstrate antegrade flow. Right Upper Extremity: Doppler waveform obliterate with right radial compression.  Doppler waveforms remain within normal limits with right ulnar compression. Left Upper Extremity: Doppler waveforms remain within normal limits with left radial compression. Doppler waveforms remain within normal limits with left ulnar compression.     Preliminary      I have independently reviewed the above radiologic studies and discussed with  the patient   Recent Lab Findings: Lab Results  Component Value Date   WBC 5.6 01/20/2020   HGB 12.9 (L) 01/20/2020   HCT 39.0 01/20/2020   PLT 237 01/20/2020   GLUCOSE 93 01/20/2020   CHOL 206 (H) 01/20/2020   TRIG 95 01/20/2020   HDL 62 01/20/2020   LDLCALC 125 (H) 01/20/2020   ALT 24 04/30/2018   AST 22 04/30/2018   NA 138 01/20/2020   K 3.7 01/20/2020   CL 104 01/20/2020   CREATININE 0.89 01/20/2020   BUN 12 01/20/2020   CO2 24 01/20/2020   HGBA1C 6.1 (H) 01/20/2020      Assessment / Plan:      1. NSTEMI- with multivessel disease CABG recommended. Continue IV heparin and IV nitro. Currently not having chest pain 2. Hypertension- moderately controlled. Not on any medication at home. On coreg currently, holding ACEI/ARB for surgery.  3. Hyperlipidemia-allergy to statin 4. A1C- 6.1  Plan: Reviewed CABG with the patient and family member over the phone. All questions answered to their satisfaction. Patient agrees to surgery and currently the plan is to take him to the OR tomorrow morning. Carotid US reviewed with the patient. He will need an Freida Busman test to determine possible use of left radial artery as conduit.    I  spent 40 minutes counseling the patient face to face.   Jari Favre, PA-C 01/20/2020 9:32 AM

## 2020-01-20 NOTE — Progress Notes (Signed)
  Echocardiogram 2D Echocardiogram has been performed.  Augustine Radar 01/20/2020, 11:31 AM

## 2020-01-20 NOTE — Discharge Instructions (Signed)

## 2020-01-20 NOTE — Progress Notes (Signed)
Discussed sternal precautions, IS (2000 mL), mobility post op, and d/c planning. 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 Gave him materials and encouraged him to watch preop video. He has a son and girlfriend, I encouraged him to discuss a d/c plan with them. Set pt up in recliner as he did not want to ambulate.  [id number] Ethelda Chick CES, ACSM 1:40 PM 01/20/2020

## 2020-01-20 NOTE — Progress Notes (Signed)
Pre cabg has been completed.   Preliminary results in CV Proc.   Blanch Media 01/20/2020 8:43 AM

## 2020-01-20 NOTE — Progress Notes (Signed)
ANTICOAGULATION CONSULT NOTE  Pharmacy Consult for Heparin Indication: CAD  Allergies  Allergen Reactions  . Atorvastatin Other (See Comments)    Dr Eden Emms told him not to take it    Patient Measurements: Height: 5\' 6"  (167.6 cm) Weight: 71.3 kg (157 lb 1.6 oz) (scale b) IBW/kg (Calculated) : 63.8 Heparin Dosing Weight:  80.7kg  Vital Signs: Temp: 97.7 F (36.5 C) (09/01 0700) Temp Source: Oral (09/01 0700) BP: 110/73 (09/01 0403) Pulse Rate: 60 (09/01 0403)  Labs: Recent Labs    01/19/20 0755 01/19/20 1023 01/20/20 0737  HGB 14.1  --  12.9*  HCT 42.5  --  39.0  PLT 285  --  237  HEPARINUNFRC  --   --  0.50  CREATININE 1.00  --   --   TROPONINIHS 88* 109*  --     Estimated Creatinine Clearance: 65.6 mL/min (by C-G formula based on SCr of 1 mg/dL).  Assessment: Todd Gonzalez admitted with NSTEMI. LHC showed LCx stenosis not amenable to PCI, heparin resumed and CVTS consulted for CABG.  Heparin level therapeutic at 0.5, CBC stable, CABG planned for 9/2.  Goal of Therapy:  Heparin level 0.3-0.7 units/ml Monitor platelets by anticoagulation protocol: Yes   Plan:  Continue heparin 1100 units/h Daily heparin level and CBC   11/2, PharmD, BCPS Clinical Pharmacist [phone] Please check AMION for all [facility] Pharmacy numbers 01/20/2020

## 2020-01-20 NOTE — Progress Notes (Signed)
Progress Note  Patient Name: Todd Gonzalez Date of Encounter: 01/20/2020  [facility] Cardiologist: Verne Carrow, MD   Subjective   No chest pain or dyspnea.   Inpatient Medications    Scheduled Meds: . aspirin EC  81 mg Oral Daily  . bisacodyl  5 mg Oral Once  . carvedilol  6.25 mg Oral BID WC  . [START ON 01/21/2020] epinephrine  0-10 mcg/min Intravenous To OR  . [START ON 01/21/2020] heparin-papaverine-plasmalyte irrigation   Irrigation To OR  . [START ON 01/21/2020] insulin   Intravenous To OR  . [START ON 01/21/2020] magnesium sulfate  40 mEq Other To OR  . [START ON 01/21/2020] phenylephrine  30-200 mcg/min Intravenous To OR  . [START ON 01/21/2020] potassium chloride  80 mEq Other To OR  . sodium chloride flush  3 mL Intravenous Q12H  . [START ON 01/21/2020] tranexamic acid  15 mg/kg Intravenous To OR  . [START ON 01/21/2020] tranexamic acid  2 mg/kg Intracatheter To OR   Continuous Infusions: . sodium chloride    . [START ON 01/21/2020] cefUROXime (ZINACEF)  IV    . [START ON 01/21/2020] cefUROXime (ZINACEF)  IV    . [START ON 01/21/2020] dexmedetomidine    . [START ON 01/21/2020] heparin 30,000 units/NS 1000 mL solution for CELLSAVER    . heparin 1,100 Units/hr (01/20/20 0034)  . [START ON 01/21/2020] milrinone    . nitroGLYCERIN 5 mcg/min (01/19/20 2038)  . [START ON 01/21/2020] nitroGLYCERIN    . [START ON 01/21/2020] norepinephrine    . [START ON 01/21/2020] tranexamic acid (CYKLOKAPRON) infusion ([facility])    . [START ON 01/21/2020] vancomycin     PRN Meds: sodium chloride, acetaminophen, nitroGLYCERIN, ondansetron (ZOFRAN) IV, sodium chloride flush   Vital Signs    Vitals:   01/20/20 0007 01/20/20 0403 01/20/20 0700 01/20/20 1151  BP: 137/82 110/73    Pulse: 63 60    Resp: 12 13    Temp: (!) 97.5 F (36.4 C) 97.9 F (36.6 C) 97.7 F (36.5 C) 99 F (37.2 C)  TempSrc: Oral Oral Oral Oral  SpO2: 98% 98%    Weight:  71.3 kg    Height:        Intake/Output Summary  (Last 24 hours) at 01/20/2020 1327 Last data filed at 01/20/2020 1300 Gross per 24 hour  Intake 946.64 ml  Output 925 ml  Net 21.64 ml   Last 3 Weights 01/20/2020 01/19/2020 04/30/2018  Weight (lbs) 157 lb 1.6 oz 178 lb 170 lb  Weight (kg) 71.26 kg 80.74 kg 77.111 kg      Telemetry    Sinus - Personally Reviewed  ECG    No AM EKG  Physical Exam   GEN: No acute distress.   Neck: No JVD Cardiac: RRR, no murmurs, rubs, or gallops.  Respiratory: Clear to auscultation bilaterally. GI: Soft, nontender, non-distended  MS: No edema; No deformity. Neuro:  Nonfocal  Psych: Normal affect   Labs    High Sensitivity Troponin:   Recent Labs  Lab 01/19/20 0755 01/19/20 1023  TROPONINIHS 88* 109*      Chemistry Recent Labs  Lab 01/19/20 0755 01/20/20 0737  NA 138 138  K 4.0 3.7  CL 103 104  CO2 21* 24  GLUCOSE 135* 93  BUN 17 12  CREATININE 1.00 0.89  CALCIUM 9.0 8.5*  GFRNONAA >60 >60  GFRAA >60 >60  ANIONGAP 14 10     Hematology Recent Labs  Lab 01/19/20 0755 01/20/20 0737  WBC 8.1 5.6  RBC 4.73 4.32  HGB 14.1 12.9*  HCT 42.5 39.0  MCV 89.9 90.3  MCH 29.8 29.9  MCHC 33.2 33.1  RDW 14.5 14.5  PLT 285 237    Radiology    CARDIAC CATHETERIZATION  Result Date: 01/19/2020  Mid LM to Dist LM lesion is 30% stenosed.  Ost Cx to Mid Cx lesion is 70% stenosed with 60% stenosed side branch in 1st Mrg.  CULPRIT LESION mid Cx to Dist Cx lesion is 95% stenosed -was initially 100% occluded, opened up somewhat with injections.  Mid LAD lesion is 70% stenosed with 70% stenosed side branch in 2nd Diag.  Dist LAD lesion is 75% stenosed.  ------------------------------  The left ventricular systolic function is normal. The left ventricular ejection fraction is 55-65% by visual estimate.  LV end diastolic pressure is normal.  SUMMARY  Severe multivessel disease:  Diffuse ostial proximal LCx disease involving takeoff of 1st Mrg with initial total occlusion prior to small  caliber diffusely diseased 2nd Mrg (now 90% ulcerated) -  Follow-on AV groove LCx gives off 3 additional posterior lateral branches distally.  Diffuse proximal to mid LAD 65% stenosis followed by normalization and then distal-apical tapering to 70% stenosis.  Small nondominant RCA  Normal EF and EDP. RECOMMENDATIONS  Admit overnight, restart IV heparin  CVTS consultation (images reviewed with Dr. Clifton James.  Would be difficult PCI of the LCx with bifurcation lesion and then extensive LAD lesions.  Continue to titrate GUIDELINE DIRECTED MEDICAL THERAPY Bryan Lemma, MD  DG Chest [facility] 1 View  Result Date: 01/19/2020 CLINICAL DATA:  Chest pain. EXAM: PORTABLE CHEST 1 VIEW COMPARISON:  01/18/2013. FINDINGS: Mediastinum and hilar structures normal. Lungs are clear. No pleural effusion or pneumothorax. Heart size stable. Prominent scratched it severe degenerative changes both shoulders. IMPRESSION: No acute cardiopulmonary disease. Electronically Signed   By: Maisie Fus  Register   On: 01/19/2020 08:40   ECHOCARDIOGRAM COMPLETE  Result Date: 01/20/2020    ECHOCARDIOGRAM REPORT   Patient Name:   Todd Gonzalez Date of Exam: 01/20/2020 Medical Rec #:  [id number]        Height:       66.0 in Accession #:    [id number]       Weight:       157.1 lb Date of Birth:  09-08-1955        BSA:          1.805 m Patient Age:    64 years         BP:           110/73 mmHg Patient Gender: M                HR:           68 bpm. Exam Location:  Inpatient Procedure: 2D Echo, Cardiac Doppler and Color Doppler Indications:    Chest Pain 786.50 / R07.9  History:        Patient has no prior history of Echocardiogram examinations.                 CAD, Arrythmias:RBBB; Risk Factors:Hypertension and                 Dyslipidemia.  Sonographer:    Eulah Pont RDCS Referring Phys: [id number] Manson Passey IMPRESSIONS  1. Left ventricular ejection fraction, by estimation, is 60 to 65%. The left ventricle has normal function. The left  ventricle demonstrates regional wall motion abnormalities (see scoring diagram/findings for  description). Left ventricular diastolic parameters are consistent with Grade I diastolic dysfunction (impaired relaxation). There is mild hypokinesis of the left ventricular, basal inferolateral wall.  2. Right ventricular systolic function is normal. The right ventricular size is normal.  3. The mitral valve is normal in structure. No evidence of mitral valve regurgitation. No evidence of mitral stenosis.  4. The aortic valve is normal in structure. Aortic valve regurgitation is not visualized. No aortic stenosis is present.  5. The inferior vena cava is normal in size with greater than 50% respiratory variability, suggesting right atrial pressure of 3 mmHg. FINDINGS  Left Ventricle: Left ventricular ejection fraction, by estimation, is 60 to 65%. The left ventricle has normal function. The left ventricle demonstrates regional wall motion abnormalities. Mild hypokinesis of the left ventricular, basal inferolateral wall. The left ventricular internal cavity size was normal in size. There is no left ventricular hypertrophy. Left ventricular diastolic parameters are consistent with Grade I diastolic dysfunction (impaired relaxation). Indeterminate filling pressures.  LV Wall Scoring: The basal inferolateral segment is hypokinetic. Right Ventricle: The right ventricular size is normal. No increase in right ventricular wall thickness. Right ventricular systolic function is normal. Left Atrium: Left atrial size was normal in size. Right Atrium: Right atrial size was normal in size. Pericardium: There is no evidence of pericardial effusion. Mitral Valve: The mitral valve is normal in structure. Normal mobility of the mitral valve leaflets. No evidence of mitral valve regurgitation. No evidence of mitral valve stenosis. Tricuspid Valve: The tricuspid valve is normal in structure. Tricuspid valve regurgitation is not demonstrated.  No evidence of tricuspid stenosis. Aortic Valve: The aortic valve is normal in structure. Aortic valve regurgitation is not visualized. No aortic stenosis is present. Pulmonic Valve: The pulmonic valve was normal in structure. Pulmonic valve regurgitation is not visualized. No evidence of pulmonic stenosis. Aorta: The aortic root is normal in size and structure. Venous: The inferior vena cava is normal in size with greater than 50% respiratory variability, suggesting right atrial pressure of 3 mmHg. IAS/Shunts: No atrial level shunt detected by color flow Doppler.  LEFT VENTRICLE PLAX 2D LVIDd:         4.40 cm  Diastology LVIDs:         3.50 cm  LV e' lateral:   6.89 cm/s LV PW:         1.40 cm  LV E/e' lateral: 11.5 LV IVS:        1.10 cm  LV e' medial:    4.30 cm/s LVOT diam:     1.90 cm  LV E/e' medial:  18.4 LV SV:         47 LV SV Index:   26 LVOT Area:     2.84 cm  RIGHT VENTRICLE RV S prime:     8.70 cm/s TAPSE (M-mode): 1.6 cm LEFT ATRIUM             Index       RIGHT ATRIUM           Index LA diam:        4.00 cm 2.22 cm/m  RA Area:     14.30 cm LA Vol (A2C):   38.1 ml 21.11 ml/m RA Volume:   32.60 ml  18.06 ml/m LA Vol (A4C):   39.1 ml 21.66 ml/m LA Biplane Vol: 38.9 ml 21.55 ml/m  AORTIC VALVE LVOT Vmax:   84.90 cm/s LVOT Vmean:  52.100 cm/s LVOT VTI:    0.165 m  AORTA Ao Root diam: 2.60 cm Ao Asc diam:  2.60 cm MITRAL VALVE MV Area (PHT): 3.31 cm     SHUNTS MV Decel Time: 229 msec     Systemic VTI:  0.16 m MV E velocity: 79.30 cm/s   Systemic Diam: 1.90 cm MV A velocity: 109.00 cm/s MV E/A ratio:  0.73 Mihai Croitoru MD Electronically signed by Thurmon FairMihai Croitoru MD Signature Date/Time: 01/20/2020/12:45:05 PM    Final    VAS US DOPPLER PRE CABG  Result Date: 01/20/2020 PREOPERATIVE VASCULAR EVALUATION  Indications:      Pre-CABG. Risk Factors:     Hypertension, hyperlipidemia, coronary artery disease. Comparison Study: no prior Performing Technologist: Blanch MediaMegan Riddle RVS  Examination Guidelines: A  complete evaluation includes B-mode imaging, spectral Doppler, color Doppler, and power Doppler as needed of all accessible portions of each vessel. Bilateral testing is considered an integral part of a complete examination. Limited examinations for reoccurring indications may be performed as noted.  Right Carotid Findings: +----------+--------+--------+--------+------------+--------+           PSV cm/sEDV cm/sStenosisDescribe    Comments +----------+--------+--------+--------+------------+--------+ CCA Prox  85      18              heterogenous         +----------+--------+--------+--------+------------+--------+ CCA Distal81      23              heterogenous         +----------+--------+--------+--------+------------+--------+ ICA Prox  82      26      1-39%   heterogenous         +----------+--------+--------+--------+------------+--------+ ICA Distal96      34                                   +----------+--------+--------+--------+------------+--------+ ECA       90      10                                   +----------+--------+--------+--------+------------+--------+ Portions of this table do not appear on this page. +----------+--------+-------+--------+------------+           PSV cm/sEDV cmsDescribeArm Pressure +----------+--------+-------+--------+------------+ Subclavian96      55                          +----------+--------+-------+--------+------------+ +---------+--------+--+--------+--+---------+ VertebralPSV cm/s47EDV cm/s16Antegrade +---------+--------+--+--------+--+---------+ Left Carotid Findings: +----------+--------+--------+--------+------------+--------+           PSV cm/sEDV cm/sStenosisDescribe    Comments +----------+--------+--------+--------+------------+--------+ CCA Prox  129     26              heterogenous         +----------+--------+--------+--------+------------+--------+ CCA Distal72      19               heterogenous         +----------+--------+--------+--------+------------+--------+ ICA Prox  82      27      1-39%   heterogenous         +----------+--------+--------+--------+------------+--------+ ICA Distal87      33                                   +----------+--------+--------+--------+------------+--------+ ECA  111     18                                   +----------+--------+--------+--------+------------+--------+ +----------+--------+--------+--------+------------+ SubclavianPSV cm/sEDV cm/sDescribeArm Pressure +----------+--------+--------+--------+------------+           93                                   +----------+--------+--------+--------+------------+ +---------+--------+--+--------+-+---------+ VertebralPSV cm/s33EDV cm/s5Antegrade +---------+--------+--+--------+-+---------+  ABI Findings: +--------+------------------+-----+---------+--------+ Right   Rt Pressure (mmHg)IndexWaveform Comment  +--------+------------------+-----+---------+--------+ Brachial110                    triphasic         +--------+------------------+-----+---------+--------+ ATA                            triphasic         +--------+------------------+-----+---------+--------+ PTA                            triphasic         +--------+------------------+-----+---------+--------+ +--------+------------------+-----+---------+-------+ Left    Lt Pressure (mmHg)IndexWaveform Comment +--------+------------------+-----+---------+-------+ Brachial                       triphasic        +--------+------------------+-----+---------+-------+ ATA                            triphasic        +--------+------------------+-----+---------+-------+ PTA                            triphasic        +--------+------------------+-----+---------+-------+  Right Doppler Findings: +--------+--------+-----+---------+--------+ Site     PressureIndexDoppler  Comments +--------+--------+-----+---------+--------+ [id number]          triphasic         +--------+--------+-----+---------+--------+ Radial               triphasic         +--------+--------+-----+---------+--------+ Ulnar                triphasic         +--------+--------+-----+---------+--------+  Left Doppler Findings: +--------+--------+-----+---------+--------+ Site    PressureIndexDoppler  Comments +--------+--------+-----+---------+--------+ Brachial             triphasic         +--------+--------+-----+---------+--------+ Radial               triphasic         +--------+--------+-----+---------+--------+ Ulnar                triphasic         +--------+--------+-----+---------+--------+  Summary: Right Carotid: Velocities in the right ICA are consistent with a 1-39% stenosis. Left Carotid: Velocities in the left ICA are consistent with a 1-39% stenosis. Vertebrals: Bilateral vertebral arteries demonstrate antegrade flow. Right Upper Extremity: Doppler waveform obliterate with right radial compression. Doppler waveforms remain within normal limits with right ulnar compression. Left Upper Extremity: Doppler waveforms remain within normal limits with left radial compression. Doppler waveforms remain within normal limits with left ulnar compression.     Preliminary     Cardiac Studies   LEFT HEART CATH AND  CORONARY ANGIOGRAPHY  Conclusion    Mid LM to Dist LM lesion is 30% stenosed.  Ost Cx to Mid Cx lesion is 70% stenosed with 60% stenosed side branch in 1st Mrg.  CULPRIT LESION mid Cx to Dist Cx lesion is 95% stenosed -was initially 100% occluded, opened up somewhat with injections.  Mid LAD lesion is 70% stenosed with 70% stenosed side branch in 2nd Diag.  Dist LAD lesion is 75% stenosed.  ------------------------------  The left ventricular systolic function is normal. The left ventricular ejection fraction is  55-65% by visual estimate.  LV end diastolic pressure is normal.   SUMMARY  Severe multivessel disease: ? Diffuse ostial proximal LCx disease involving takeoff of 1st Mrg with initial total occlusion prior to small caliber diffusely diseased 2nd Mrg (now 90% ulcerated) -  Follow-on AV groove LCx gives off 3 additional posterior lateral branches distally. ? Diffuse proximal to mid LAD 65% stenosis followed by normalization and then distal-apical tapering to 70% stenosis. ? Small nondominant RCA  Normal EF and EDP.   RECOMMENDATIONS  Admit overnight, restart IV heparin  CVTS consultation (images reviewed with Dr. Clifton James.  Would be difficult PCI of the LCx with bifurcation lesion and then extensive LAD lesions.  Continue to titrate GUIDELINE DIRECTED MEDICAL THERAPY  Diagnostic Dominance: Left   Patient Profile     64 y.o. male with past medical history of CAD s/p ruptured plaque in OM in 2003, hypertension, hyperlipidemia and chronic right bundle branch block presented for chest pain evaluation and found to have elevated troponin.  Assessment & Plan    1. NSTEMI: Mild elevation troponin. EKG with non specific ST/T wave changes inferiorly. Cath showed severe multivessel disease as above. Possible CABG tomorrow. Will continue ASA, beta blocker, IV NTG, IV heparin. Start low dose Crestor.   2. HTN: BP controlled. Continue current meds.     For questions or updates, please contact [facility] Please consult [email or web address] for contact info under

## 2020-01-21 ENCOUNTER — Inpatient Hospital Stay (HOSPITAL_COMMUNITY): Payer: Medicaid Other | Admitting: Registered Nurse

## 2020-01-21 ENCOUNTER — Inpatient Hospital Stay (HOSPITAL_COMMUNITY): Payer: Medicaid Other

## 2020-01-21 ENCOUNTER — Inpatient Hospital Stay (HOSPITAL_COMMUNITY)
Admission: EM | Disposition: A | Discharge status: Home / Self Care | Payer: Self-pay | Source: Home / Self Care | Attending: Cardiothoracic Surgery

## 2020-01-21 DIAGNOSIS — I2510 Atherosclerotic heart disease of native coronary artery without angina pectoris: Secondary | ICD-10-CM | POA: Diagnosis present

## 2020-01-21 DIAGNOSIS — Z951 Presence of aortocoronary bypass graft: Secondary | ICD-10-CM

## 2020-01-21 HISTORY — PX: TEE WITHOUT CARDIOVERSION: SHX5443

## 2020-01-21 HISTORY — PX: RADIAL ARTERY HARVEST: SHX5067

## 2020-01-21 HISTORY — PX: CORONARY ARTERY BYPASS GRAFT: SHX141

## 2020-01-21 LAB — POCT I-STAT 7, (LYTES, BLD GAS, ICA,H+H)
Acid-Base Excess: 2 mmol/L (ref 0.0–2.0)
Acid-Base Excess: 2 mmol/L (ref 0.0–2.0)
Acid-base deficit: 1 mmol/L (ref 0.0–2.0)
Acid-base deficit: 1 mmol/L (ref 0.0–2.0)
Acid-base deficit: 2 mmol/L (ref 0.0–2.0)
Acid-base deficit: 3 mmol/L — ABNORMAL HIGH (ref 0.0–2.0)
Acid-base deficit: 4 mmol/L — ABNORMAL HIGH (ref 0.0–2.0)
Acid-base deficit: 2 mmol/L (ref 0.0–2.0)
Bicarbonate: 24.4 mmol/L (ref 20.0–28.0)
Bicarbonate: 26.6 mmol/L (ref 20.0–28.0)
Bicarbonate: 27.1 mmol/L (ref 20.0–28.0)
Bicarbonate: 24.1 mmol/L (ref 20.0–28.0)
Bicarbonate: 22.9 mmol/L (ref 20.0–28.0)
Bicarbonate: 22 mmol/L (ref 20.0–28.0)
Bicarbonate: 22.2 mmol/L (ref 20.0–28.0)
Bicarbonate: 22.7 mmol/L (ref 20.0–28.0)
Calcium, Ion: 1.2 mmol/L (ref 1.15–1.40)
Calcium, Ion: 0.89 mmol/L — CL (ref 1.15–1.40)
Calcium, Ion: 1 mmol/L — ABNORMAL LOW (ref 1.15–1.40)
Calcium, Ion: 0.98 mmol/L — ABNORMAL LOW (ref 1.15–1.40)
Calcium, Ion: 1.07 mmol/L — ABNORMAL LOW (ref 1.15–1.40)
Calcium, Ion: 1.23 mmol/L (ref 1.15–1.40)
Calcium, Ion: 1.25 mmol/L (ref 1.15–1.40)
Calcium, Ion: 1.16 mmol/L (ref 1.15–1.40)
HCT: 36 % — ABNORMAL LOW (ref 39.0–52.0)
HCT: 26 % — ABNORMAL LOW (ref 39.0–52.0)
HCT: 27 % — ABNORMAL LOW (ref 39.0–52.0)
HCT: 25 % — ABNORMAL LOW (ref 39.0–52.0)
HCT: 28 % — ABNORMAL LOW (ref 39.0–52.0)
HCT: 26 % — ABNORMAL LOW (ref 39.0–52.0)
HCT: 28 % — ABNORMAL LOW (ref 39.0–52.0)
HCT: 27 % — ABNORMAL LOW (ref 39.0–52.0)
Hemoglobin: 12.2 g/dL — ABNORMAL LOW (ref 13.0–17.0)
Hemoglobin: 8.8 g/dL — ABNORMAL LOW (ref 13.0–17.0)
Hemoglobin: 9.2 g/dL — ABNORMAL LOW (ref 13.0–17.0)
Hemoglobin: 8.5 g/dL — ABNORMAL LOW (ref 13.0–17.0)
Hemoglobin: 9.5 g/dL — ABNORMAL LOW (ref 13.0–17.0)
Hemoglobin: 8.8 g/dL — ABNORMAL LOW (ref 13.0–17.0)
Hemoglobin: 9.5 g/dL — ABNORMAL LOW (ref 13.0–17.0)
Hemoglobin: 9.2 g/dL — ABNORMAL LOW (ref 13.0–17.0)
O2 Saturation: 100 %
O2 Saturation: 100 %
O2 Saturation: 100 %
O2 Saturation: 100 %
O2 Saturation: 100 %
O2 Saturation: 100 %
O2 Saturation: 99 %
O2 Saturation: 99 %
Patient temperature: 35.3
Patient temperature: 37.9
Patient temperature: 38.1
Patient temperature: 37.8
Potassium: 3.3 mmol/L — ABNORMAL LOW (ref 3.5–5.1)
Potassium: 4.3 mmol/L (ref 3.5–5.1)
Potassium: 4.9 mmol/L (ref 3.5–5.1)
Potassium: 4.3 mmol/L (ref 3.5–5.1)
Potassium: 4.1 mmol/L (ref 3.5–5.1)
Potassium: 4.7 mmol/L (ref 3.5–5.1)
Potassium: 4.9 mmol/L (ref 3.5–5.1)
Potassium: 5.3 mmol/L — ABNORMAL HIGH (ref 3.5–5.1)
Sodium: 138 mmol/L (ref 135–145)
Sodium: 142 mmol/L (ref 135–145)
Sodium: 138 mmol/L (ref 135–145)
Sodium: 139 mmol/L (ref 135–145)
Sodium: 140 mmol/L (ref 135–145)
Sodium: 137 mmol/L (ref 135–145)
Sodium: 139 mmol/L (ref 135–145)
Sodium: 137 mmol/L (ref 135–145)
TCO2: 26 mmol/L (ref 22–32)
TCO2: 28 mmol/L (ref 22–32)
TCO2: 28 mmol/L (ref 22–32)
TCO2: 25 mmol/L (ref 22–32)
TCO2: 24 mmol/L (ref 22–32)
TCO2: 23 mmol/L (ref 22–32)
TCO2: 23 mmol/L (ref 22–32)
TCO2: 24 mmol/L (ref 22–32)
pCO2 arterial: 40.4 mmHg (ref 32.0–48.0)
pCO2 arterial: 42.8 mmHg (ref 32.0–48.0)
pCO2 arterial: 44.9 mmHg (ref 32.0–48.0)
pCO2 arterial: 42.6 mmHg (ref 32.0–48.0)
pCO2 arterial: 36.7 mmHg (ref 32.0–48.0)
pCO2 arterial: 42.2 mmHg (ref 32.0–48.0)
pCO2 arterial: 43.3 mmHg (ref 32.0–48.0)
pCO2 arterial: 40.3 mmHg (ref 32.0–48.0)
pH, Arterial: 7.389 (ref 7.350–7.450)
pH, Arterial: 7.401 (ref 7.350–7.450)
pH, Arterial: 7.389 (ref 7.350–7.450)
pH, Arterial: 7.361 (ref 7.350–7.450)
pH, Arterial: 7.396 (ref 7.350–7.450)
pH, Arterial: 7.323 — ABNORMAL LOW (ref 7.350–7.450)
pH, Arterial: 7.329 — ABNORMAL LOW (ref 7.350–7.450)
pH, Arterial: 7.362 (ref 7.350–7.450)
pO2, Arterial: 199 mmHg — ABNORMAL HIGH (ref 83.0–108.0)
pO2, Arterial: 412 mmHg — ABNORMAL HIGH (ref 83.0–108.0)
pO2, Arterial: 256 mmHg — ABNORMAL HIGH (ref 83.0–108.0)
pO2, Arterial: 209 mmHg — ABNORMAL HIGH (ref 83.0–108.0)
pO2, Arterial: 182 mmHg — ABNORMAL HIGH (ref 83.0–108.0)
pO2, Arterial: 141 mmHg — ABNORMAL HIGH (ref 83.0–108.0)
pO2, Arterial: 196 mmHg — ABNORMAL HIGH (ref 83.0–108.0)
pO2, Arterial: 136 mmHg — ABNORMAL HIGH (ref 83.0–108.0)

## 2020-01-21 LAB — HEPARIN LEVEL (UNFRACTIONATED): Heparin Unfractionated: 0.46 IU/mL (ref 0.30–0.70)

## 2020-01-21 LAB — POCT I-STAT, CHEM 8
BUN: 9 mg/dL (ref 8–23)
BUN: 7 mg/dL — ABNORMAL LOW (ref 8–23)
BUN: 8 mg/dL (ref 8–23)
BUN: 8 mg/dL (ref 8–23)
BUN: 8 mg/dL (ref 8–23)
BUN: 8 mg/dL (ref 8–23)
Calcium, Ion: 1.23 mmol/L (ref 1.15–1.40)
Calcium, Ion: 0.92 mmol/L — ABNORMAL LOW (ref 1.15–1.40)
Calcium, Ion: 1.23 mmol/L (ref 1.15–1.40)
Calcium, Ion: 0.99 mmol/L — ABNORMAL LOW (ref 1.15–1.40)
Calcium, Ion: 1.03 mmol/L — ABNORMAL LOW (ref 1.15–1.40)
Calcium, Ion: 0.99 mmol/L — ABNORMAL LOW (ref 1.15–1.40)
Chloride: 101 mmol/L (ref 98–111)
Chloride: 103 mmol/L (ref 98–111)
Chloride: 98 mmol/L (ref 98–111)
Chloride: 102 mmol/L (ref 98–111)
Chloride: 102 mmol/L (ref 98–111)
Chloride: 102 mmol/L (ref 98–111)
Creatinine, Ser: 0.7 mg/dL (ref 0.61–1.24)
Creatinine, Ser: 0.5 mg/dL — ABNORMAL LOW (ref 0.61–1.24)
Creatinine, Ser: 0.6 mg/dL — ABNORMAL LOW (ref 0.61–1.24)
Creatinine, Ser: 0.5 mg/dL — ABNORMAL LOW (ref 0.61–1.24)
Creatinine, Ser: 0.5 mg/dL — ABNORMAL LOW (ref 0.61–1.24)
Creatinine, Ser: 0.6 mg/dL — ABNORMAL LOW (ref 0.61–1.24)
Glucose, Bld: 115 mg/dL — ABNORMAL HIGH (ref 70–99)
Glucose, Bld: 108 mg/dL — ABNORMAL HIGH (ref 70–99)
Glucose, Bld: 93 mg/dL (ref 70–99)
Glucose, Bld: 131 mg/dL — ABNORMAL HIGH (ref 70–99)
Glucose, Bld: 146 mg/dL — ABNORMAL HIGH (ref 70–99)
Glucose, Bld: 128 mg/dL — ABNORMAL HIGH (ref 70–99)
HCT: 37 % — ABNORMAL LOW (ref 39.0–52.0)
HCT: 29 % — ABNORMAL LOW (ref 39.0–52.0)
HCT: 34 % — ABNORMAL LOW (ref 39.0–52.0)
HCT: 29 % — ABNORMAL LOW (ref 39.0–52.0)
HCT: 27 % — ABNORMAL LOW (ref 39.0–52.0)
HCT: 26 % — ABNORMAL LOW (ref 39.0–52.0)
Hemoglobin: 12.6 g/dL — ABNORMAL LOW (ref 13.0–17.0)
Hemoglobin: 11.6 g/dL — ABNORMAL LOW (ref 13.0–17.0)
Hemoglobin: 9.9 g/dL — ABNORMAL LOW (ref 13.0–17.0)
Hemoglobin: 9.9 g/dL — ABNORMAL LOW (ref 13.0–17.0)
Hemoglobin: 9.2 g/dL — ABNORMAL LOW (ref 13.0–17.0)
Hemoglobin: 8.8 g/dL — ABNORMAL LOW (ref 13.0–17.0)
Potassium: 3.3 mmol/L — ABNORMAL LOW (ref 3.5–5.1)
Potassium: 3.6 mmol/L (ref 3.5–5.1)
Potassium: 4.1 mmol/L (ref 3.5–5.1)
Potassium: 4.9 mmol/L (ref 3.5–5.1)
Potassium: 4.7 mmol/L (ref 3.5–5.1)
Potassium: 4.3 mmol/L (ref 3.5–5.1)
Sodium: 137 mmol/L (ref 135–145)
Sodium: 136 mmol/L (ref 135–145)
Sodium: 139 mmol/L (ref 135–145)
Sodium: 137 mmol/L (ref 135–145)
Sodium: 137 mmol/L (ref 135–145)
Sodium: 138 mmol/L (ref 135–145)
TCO2: 22 mmol/L (ref 22–32)
TCO2: 23 mmol/L (ref 22–32)
TCO2: 24 mmol/L (ref 22–32)
TCO2: 24 mmol/L (ref 22–32)
TCO2: 25 mmol/L (ref 22–32)
TCO2: 22 mmol/L (ref 22–32)

## 2020-01-21 LAB — COMPREHENSIVE METABOLIC PANEL
ALT: 21 U/L (ref 0–44)
AST: 17 U/L (ref 15–41)
Albumin: 3 g/dL — ABNORMAL LOW (ref 3.5–5.0)
Alkaline Phosphatase: 40 U/L (ref 38–126)
Anion gap: 9 (ref 5–15)
BUN: 9 mg/dL (ref 8–23)
CO2: 24 mmol/L (ref 22–32)
Calcium: 8.8 mg/dL — ABNORMAL LOW (ref 8.9–10.3)
Chloride: 104 mmol/L (ref 98–111)
Creatinine, Ser: 0.94 mg/dL (ref 0.61–1.24)
GFR calc Af Amer: >60 mL/min (ref >60)
GFR calc non Af Amer: >60 mL/min (ref >60)
Glucose, Bld: 100 mg/dL — ABNORMAL HIGH (ref 70–99)
Potassium: 3.5 mmol/L (ref 3.5–5.1)
Sodium: 137 mmol/L (ref 135–145)
Total Bilirubin: 0.9 mg/dL (ref 0.3–1.2)
Total Protein: 5.9 g/dL — ABNORMAL LOW (ref 6.5–8.1)

## 2020-01-21 LAB — GLUCOSE, CAPILLARY
Glucose-Capillary: 122 mg/dL — ABNORMAL HIGH (ref 70–99)
Glucose-Capillary: 90 mg/dL (ref 70–99)
Glucose-Capillary: 98 mg/dL (ref 70–99)
Glucose-Capillary: 110 mg/dL — ABNORMAL HIGH (ref 70–99)
Glucose-Capillary: 107 mg/dL — ABNORMAL HIGH (ref 70–99)

## 2020-01-21 LAB — CBC
HCT: 39.6 % (ref 39.0–52.0)
HCT: 30.6 % — ABNORMAL LOW (ref 39.0–52.0)
HCT: 29.4 % — ABNORMAL LOW (ref 39.0–52.0)
Hemoglobin: 13.5 g/dL (ref 13.0–17.0)
Hemoglobin: 10.3 g/dL — ABNORMAL LOW (ref 13.0–17.0)
Hemoglobin: 9.8 g/dL — ABNORMAL LOW (ref 13.0–17.0)
MCH: 30.7 pg (ref 26.0–34.0)
MCH: 30.7 pg (ref 26.0–34.0)
MCH: 30.8 pg (ref 26.0–34.0)
MCHC: 34.1 g/dL (ref 30.0–36.0)
MCHC: 33.7 g/dL (ref 30.0–36.0)
MCHC: 33.3 g/dL (ref 30.0–36.0)
MCV: 90 fL (ref 80.0–100.0)
MCV: 91.3 fL (ref 80.0–100.0)
MCV: 92.5 fL (ref 80.0–100.0)
Platelets: 248 10*3/uL (ref 150–400)
Platelets: 133 10*3/uL — ABNORMAL LOW (ref 150–400)
Platelets: 209 10*3/uL (ref 150–400)
RBC: 4.4 MIL/uL (ref 4.22–5.81)
RBC: 3.35 MIL/uL — ABNORMAL LOW (ref 4.22–5.81)
RBC: 3.18 MIL/uL — ABNORMAL LOW (ref 4.22–5.81)
RDW: 14 % (ref 11.5–15.5)
RDW: 14 % (ref 11.5–15.5)
RDW: 14.2 % (ref 11.5–15.5)
WBC: 6.7 10*3/uL (ref 4.0–10.5)
WBC: 8.4 10*3/uL (ref 4.0–10.5)
WBC: 10.6 10*3/uL — ABNORMAL HIGH (ref 4.0–10.5)
nRBC: 0 % (ref 0.0–0.2)
nRBC: 0 % (ref 0.0–0.2)
nRBC: 0 % (ref 0.0–0.2)

## 2020-01-21 LAB — BASIC METABOLIC PANEL WITH GFR
Anion gap: 6 (ref 5–15)
BUN: 8 mg/dL (ref 8–23)
CO2: 21 mmol/L — ABNORMAL LOW (ref 22–32)
Calcium: 8.2 mg/dL — ABNORMAL LOW (ref 8.9–10.3)
Chloride: 109 mmol/L (ref 98–111)
Creatinine, Ser: 1.01 mg/dL (ref 0.61–1.24)
GFR calc Af Amer: >60 mL/min
GFR calc non Af Amer: >60 mL/min
Glucose, Bld: 124 mg/dL — ABNORMAL HIGH (ref 70–99)
Potassium: 4.8 mmol/L (ref 3.5–5.1)
Sodium: 136 mmol/L (ref 135–145)

## 2020-01-21 LAB — MAGNESIUM: Magnesium: 3 mg/dL — ABNORMAL HIGH (ref 1.7–2.4)

## 2020-01-21 LAB — PLATELET COUNT: Platelets: 155 10*3/uL (ref 150–400)

## 2020-01-21 LAB — URINALYSIS, ROUTINE W REFLEX MICROSCOPIC
Bilirubin Urine: NEGATIVE
Glucose, UA: NEGATIVE mg/dL
Hgb urine dipstick: NEGATIVE
Ketones, ur: NEGATIVE mg/dL
Leukocytes,Ua: NEGATIVE
Nitrite: NEGATIVE
Protein, ur: NEGATIVE mg/dL
Specific Gravity, Urine: 1.018 (ref 1.005–1.030)
pH: 5 (ref 5.0–8.0)

## 2020-01-21 LAB — HEMOGLOBIN AND HEMATOCRIT, BLOOD
HCT: 27.1 % — ABNORMAL LOW (ref 39.0–52.0)
Hemoglobin: 9 g/dL — ABNORMAL LOW (ref 13.0–17.0)

## 2020-01-21 LAB — SURGICAL PCR SCREEN
MRSA, PCR: NEGATIVE
Staphylococcus aureus: POSITIVE — AB

## 2020-01-21 LAB — PROTIME-INR
INR: 1 (ref 0.8–1.2)
INR: 1.3 — ABNORMAL HIGH (ref 0.8–1.2)
Prothrombin Time: 12.7 seconds (ref 11.4–15.2)
Prothrombin Time: 15.6 seconds — ABNORMAL HIGH (ref 11.4–15.2)

## 2020-01-21 LAB — APTT
aPTT: 82 seconds — ABNORMAL HIGH (ref 24–36)
aPTT: 34 seconds (ref 24–36)

## 2020-01-21 LAB — PREPARE RBC (CROSSMATCH)

## 2020-01-21 SURGERY — CORONARY ARTERY BYPASS GRAFTING (CABG)
Anesthesia: General | Site: Chest

## 2020-01-21 MED ORDER — VANCOMYCIN HCL IN DEXTROSE 1-5 GM/200ML-% IV SOLN
1000.0000 mg | Freq: Once | INTRAVENOUS | Status: AC
  Administered 2020-01-21: 1000 mg via INTRAVENOUS
  Filled 2020-01-21: qty 200

## 2020-01-21 MED ORDER — PHENYLEPHRINE HCL-NACL 20-0.9 MG/250ML-% IV SOLN
0.0000 ug/min | INTRAVENOUS | Status: DC
  Administered 2020-01-21: 60 ug/min via INTRAVENOUS
  Filled 2020-01-21 (×2): qty 250

## 2020-01-21 MED ORDER — SODIUM CHLORIDE 0.9% IV SOLUTION: Freq: Once | INTRAVENOUS | Status: DC

## 2020-01-21 MED ORDER — LIDOCAINE 2% (20 MG/ML) 5 ML SYRINGE
INTRAMUSCULAR | Status: AC
  Filled 2020-01-21: qty 5

## 2020-01-21 MED ORDER — DEXMEDETOMIDINE HCL IN NACL 400 MCG/100ML IV SOLN: 0.0000 ug/kg/h | INTRAVENOUS | Status: DC

## 2020-01-21 MED ORDER — ALBUMIN HUMAN 5 % IV SOLN: INTRAVENOUS | Status: DC | PRN

## 2020-01-21 MED ORDER — ONDANSETRON HCL 4 MG/2ML IJ SOLN: 4.0000 mg | Freq: Four times a day (QID) | INTRAMUSCULAR | Status: DC | PRN

## 2020-01-21 MED ORDER — METOPROLOL TARTRATE 12.5 MG HALF TABLET
12.5000 mg | ORAL_TABLET | Freq: Once | ORAL | Status: AC
  Administered 2020-01-21: 12.5 mg via ORAL
  Filled 2020-01-21: qty 1

## 2020-01-21 MED ORDER — CHLORHEXIDINE GLUCONATE CLOTH 2 % EX PADS
6.0000 | MEDICATED_PAD | Freq: Every day | CUTANEOUS | Status: DC
  Administered 2020-01-21 – 2020-01-23 (×3): 6 via TOPICAL

## 2020-01-21 MED ORDER — THROMBIN 5000 UNITS EX SOLR
CUTANEOUS | Status: DC | PRN
  Administered 2020-01-21: 5000 [IU] via TOPICAL

## 2020-01-21 MED ORDER — ACETAMINOPHEN 160 MG/5ML PO SOLN: 1000.0000 mg | Freq: Four times a day (QID) | ORAL | Status: DC

## 2020-01-21 MED ORDER — SODIUM CHLORIDE 0.9% FLUSH
3.0000 mL | Freq: Two times a day (BID) | INTRAVENOUS | Status: DC
  Administered 2020-01-22 – 2020-01-25 (×7): 3 mL via INTRAVENOUS

## 2020-01-21 MED ORDER — INSULIN ASPART 100 UNIT/ML ~~LOC~~ SOLN
0.0000 [IU] | SUBCUTANEOUS | Status: DC
  Administered 2020-01-22: 4 [IU] via SUBCUTANEOUS
  Administered 2020-01-22 (×4): 2 [IU] via SUBCUTANEOUS
  Administered 2020-01-22: 4 [IU] via SUBCUTANEOUS
  Administered 2020-01-23: 2 [IU] via SUBCUTANEOUS

## 2020-01-21 MED ORDER — EPHEDRINE 5 MG/ML INJ
INTRAVENOUS | Status: AC
  Filled 2020-01-21: qty 10

## 2020-01-21 MED ORDER — ROCURONIUM BROMIDE 10 MG/ML (PF) SYRINGE
PREFILLED_SYRINGE | INTRAVENOUS | Status: DC | PRN
  Administered 2020-01-21: 90 mg via INTRAVENOUS
  Administered 2020-01-21 (×4): 50 mg via INTRAVENOUS

## 2020-01-21 MED ORDER — MIDAZOLAM HCL 2 MG/2ML IJ SOLN: 2.0000 mg | INTRAMUSCULAR | Status: DC | PRN

## 2020-01-21 MED ORDER — MAGNESIUM SULFATE 4 GM/100ML IV SOLN
4.0000 g | Freq: Once | INTRAVENOUS | Status: AC
  Administered 2020-01-21: 4 g via INTRAVENOUS

## 2020-01-21 MED ORDER — FENTANYL CITRATE (PF) 250 MCG/5ML IJ SOLN
INTRAMUSCULAR | Status: AC
  Filled 2020-01-21: qty 5

## 2020-01-21 MED ORDER — VANCOMYCIN HCL 1000 MG IV SOLR
INTRAVENOUS | Status: AC
  Filled 2020-01-21: qty 3000

## 2020-01-21 MED ORDER — ACETAMINOPHEN 650 MG RE SUPP
650.0000 mg | Freq: Once | RECTAL | Status: AC
  Administered 2020-01-21: 650 mg via RECTAL

## 2020-01-21 MED ORDER — CHLORHEXIDINE GLUCONATE 0.12 % MT SOLN
15.0000 mL | OROMUCOSAL | Status: AC
  Administered 2020-01-21: 15 mL via OROMUCOSAL

## 2020-01-21 MED ORDER — ONDANSETRON HCL 4 MG/2ML IJ SOLN
INTRAMUSCULAR | Status: AC
  Filled 2020-01-21: qty 2

## 2020-01-21 MED ORDER — INSULIN REGULAR(HUMAN) IN NACL 100-0.9 UT/100ML-% IV SOLN: INTRAVENOUS | Status: DC

## 2020-01-21 MED ORDER — STERILE WATER FOR INJECTION IV SOLN
INTRAVENOUS | Status: DC | PRN
  Administered 2020-01-21: 30 mL

## 2020-01-21 MED ORDER — ACETAMINOPHEN 500 MG PO TABS
1000.0000 mg | ORAL_TABLET | Freq: Four times a day (QID) | ORAL | Status: DC
  Administered 2020-01-22 – 2020-01-24 (×12): 1000 mg via ORAL
  Filled 2020-01-21 (×12): qty 2

## 2020-01-21 MED ORDER — SODIUM CHLORIDE 0.45 % IV SOLN: INTRAVENOUS | Status: DC | PRN

## 2020-01-21 MED ORDER — ROCURONIUM BROMIDE 10 MG/ML (PF) SYRINGE
PREFILLED_SYRINGE | INTRAVENOUS | Status: AC
  Filled 2020-01-21: qty 10

## 2020-01-21 MED ORDER — BUPIVACAINE LIPOSOME 1.3 % IJ SUSP
INTRAMUSCULAR | Status: DC | PRN
  Administered 2020-01-21: 50 mL

## 2020-01-21 MED ORDER — ROCURONIUM BROMIDE 100 MG/10ML IV SOLN: INTRAVENOUS | Status: DC | PRN

## 2020-01-21 MED ORDER — FENTANYL CITRATE (PF) 250 MCG/5ML IJ SOLN
INTRAMUSCULAR | Status: AC
  Filled 2020-01-21: qty 20

## 2020-01-21 MED ORDER — PROPOFOL 10 MG/ML IV BOLUS
INTRAVENOUS | Status: DC | PRN
  Administered 2020-01-21: 100 mg via INTRAVENOUS

## 2020-01-21 MED ORDER — PROTAMINE SULFATE 10 MG/ML IV SOLN
INTRAVENOUS | Status: DC | PRN
  Administered 2020-01-21: 250 mg via INTRAVENOUS

## 2020-01-21 MED ORDER — CHLORHEXIDINE GLUCONATE CLOTH 2 % EX PADS
6.0000 | MEDICATED_PAD | Freq: Once | CUTANEOUS | Status: AC
  Administered 2020-01-21: 6 via TOPICAL

## 2020-01-21 MED ORDER — LACTATED RINGERS IV SOLN: INTRAVENOUS | Status: DC | PRN

## 2020-01-21 MED ORDER — PANTOPRAZOLE SODIUM 40 MG PO TBEC
40.0000 mg | DELAYED_RELEASE_TABLET | Freq: Every day | ORAL | Status: DC
  Administered 2020-01-23 – 2020-01-25 (×3): 40 mg via ORAL
  Filled 2020-01-21 (×4): qty 1

## 2020-01-21 MED ORDER — PHENYLEPHRINE 40 MCG/ML (10ML) SYRINGE FOR IV PUSH (FOR BLOOD PRESSURE SUPPORT)
PREFILLED_SYRINGE | INTRAVENOUS | Status: DC | PRN
  Administered 2020-01-21 (×4): 80 ug via INTRAVENOUS
  Administered 2020-01-21: 40 ug via INTRAVENOUS
  Administered 2020-01-21: 80 ug via INTRAVENOUS
  Administered 2020-01-21: 40 ug via INTRAVENOUS
  Administered 2020-01-21: 80 ug via INTRAVENOUS

## 2020-01-21 MED ORDER — BUPIVACAINE LIPOSOME 1.3 % IJ SUSP
20.0000 mL | Freq: Once | INTRAMUSCULAR | Status: DC
  Filled 2020-01-21: qty 20

## 2020-01-21 MED ORDER — PROPOFOL 10 MG/ML IV BOLUS
INTRAVENOUS | Status: AC
  Filled 2020-01-21: qty 20

## 2020-01-21 MED ORDER — SODIUM CHLORIDE 0.9 % IV SOLN: 250.0000 mL | INTRAVENOUS | Status: DC

## 2020-01-21 MED ORDER — ARTIFICIAL TEARS OPHTHALMIC OINT
TOPICAL_OINTMENT | OPHTHALMIC | Status: DC | PRN
  Administered 2020-01-21: 1 via OPHTHALMIC

## 2020-01-21 MED ORDER — STERILE WATER FOR INJECTION IJ SOLN
INTRAMUSCULAR | Status: AC
  Filled 2020-01-21: qty 10

## 2020-01-21 MED ORDER — BISACODYL 5 MG PO TBEC
10.0000 mg | DELAYED_RELEASE_TABLET | Freq: Every day | ORAL | Status: DC
  Administered 2020-01-22 – 2020-01-25 (×3): 10 mg via ORAL
  Filled 2020-01-21 (×3): qty 2

## 2020-01-21 MED ORDER — SODIUM CHLORIDE 0.9% FLUSH: 3.0000 mL | INTRAVENOUS | Status: DC | PRN

## 2020-01-21 MED ORDER — CHLORHEXIDINE GLUCONATE 0.12 % MT SOLN
15.0000 mL | Freq: Once | OROMUCOSAL | Status: AC
  Administered 2020-01-21: 15 mL via OROMUCOSAL
  Filled 2020-01-21: qty 15

## 2020-01-21 MED ORDER — LACTATED RINGERS IV SOLN: 500.0000 mL | Freq: Once | INTRAVENOUS | Status: DC | PRN

## 2020-01-21 MED ORDER — POTASSIUM CHLORIDE 10 MEQ/50ML IV SOLN: 10.0000 meq | INTRAVENOUS | Status: AC

## 2020-01-21 MED ORDER — MORPHINE SULFATE (PF) 2 MG/ML IV SOLN
1.0000 mg | INTRAVENOUS | Status: DC | PRN
  Administered 2020-01-21: 2 mg via INTRAVENOUS
  Administered 2020-01-22: 4 mg via INTRAVENOUS
  Filled 2020-01-21: qty 1
  Filled 2020-01-21: qty 2

## 2020-01-21 MED ORDER — ASPIRIN 81 MG PO CHEW: 324.0000 mg | CHEWABLE_TABLET | Freq: Every day | ORAL | Status: DC

## 2020-01-21 MED ORDER — MIDAZOLAM HCL 5 MG/5ML IJ SOLN
INTRAMUSCULAR | Status: DC | PRN
  Administered 2020-01-21: 1 mg via INTRAVENOUS
  Administered 2020-01-21: 4 mg via INTRAVENOUS
  Administered 2020-01-21: 2 mg via INTRAVENOUS
  Administered 2020-01-21: 3 mg via INTRAVENOUS

## 2020-01-21 MED ORDER — OXYCODONE HCL 5 MG PO TABS
5.0000 mg | ORAL_TABLET | ORAL | Status: DC | PRN
  Administered 2020-01-22 (×2): 10 mg via ORAL
  Filled 2020-01-21 (×3): qty 2

## 2020-01-21 MED ORDER — MIDAZOLAM HCL (PF) 10 MG/2ML IJ SOLN
INTRAMUSCULAR | Status: AC
  Filled 2020-01-21: qty 2

## 2020-01-21 MED ORDER — HEPARIN SODIUM (PORCINE) 1000 UNIT/ML IJ SOLN
INTRAMUSCULAR | Status: DC | PRN
  Administered 2020-01-21: 25000 [IU] via INTRAVENOUS

## 2020-01-21 MED ORDER — PHENYLEPHRINE 40 MCG/ML (10ML) SYRINGE FOR IV PUSH (FOR BLOOD PRESSURE SUPPORT)
PREFILLED_SYRINGE | INTRAVENOUS | Status: AC
  Filled 2020-01-21: qty 10

## 2020-01-21 MED ORDER — DEXTROSE 50 % IV SOLN: 0.0000 mL | INTRAVENOUS | Status: DC | PRN

## 2020-01-21 MED ORDER — METOPROLOL TARTRATE 5 MG/5ML IV SOLN
2.5000 mg | INTRAVENOUS | Status: DC | PRN
  Administered 2020-01-21: 5 mg via INTRAVENOUS
  Filled 2020-01-21: qty 5

## 2020-01-21 MED ORDER — PLASMA-LYTE 148 IV SOLN
INTRAVENOUS | Status: DC | PRN
  Administered 2020-01-21: 500 mL via INTRAVASCULAR

## 2020-01-21 MED ORDER — ISOSORBIDE DINITRATE 10 MG PO TABS
10.0000 mg | ORAL_TABLET | Freq: Three times a day (TID) | ORAL | Status: DC
  Administered 2020-01-22 – 2020-01-25 (×10): 10 mg via ORAL
  Filled 2020-01-21 (×10): qty 1

## 2020-01-21 MED ORDER — ACETAMINOPHEN 160 MG/5ML PO SOLN: 650.0000 mg | Freq: Once | ORAL | Status: AC

## 2020-01-21 MED ORDER — TRAMADOL HCL 50 MG PO TABS
50.0000 mg | ORAL_TABLET | ORAL | Status: DC | PRN
  Administered 2020-01-22: 50 mg via ORAL
  Filled 2020-01-21: qty 1

## 2020-01-21 MED ORDER — METOPROLOL TARTRATE 12.5 MG HALF TABLET: 12.5000 mg | ORAL_TABLET | Freq: Two times a day (BID) | ORAL | Status: DC

## 2020-01-21 MED ORDER — 0.9 % SODIUM CHLORIDE (POUR BTL) OPTIME
TOPICAL | Status: DC | PRN
  Administered 2020-01-21: 5000 mL

## 2020-01-21 MED ORDER — ASPIRIN EC 325 MG PO TBEC
325.0000 mg | DELAYED_RELEASE_TABLET | Freq: Every day | ORAL | Status: DC
  Administered 2020-01-22 – 2020-01-25 (×4): 325 mg via ORAL
  Filled 2020-01-21 (×4): qty 1

## 2020-01-21 MED ORDER — CALCIUM CHLORIDE 10 % IV SOLN
1.0000 g | Freq: Once | INTRAVENOUS | Status: AC
  Administered 2020-01-21: 1 g via INTRAVENOUS

## 2020-01-21 MED ORDER — ALBUMIN HUMAN 5 % IV SOLN
250.0000 mL | INTRAVENOUS | Status: AC | PRN
  Administered 2020-01-21 (×4): 12.5 g via INTRAVENOUS
  Filled 2020-01-21 (×2): qty 250

## 2020-01-21 MED ORDER — STERILE WATER FOR INJECTION IJ SOLN
INTRAMUSCULAR | Status: DC | PRN
  Administered 2020-01-21: 10 mL

## 2020-01-21 MED ORDER — METOPROLOL TARTRATE 25 MG/10 ML ORAL SUSPENSION: 12.5000 mg | Freq: Two times a day (BID) | ORAL | Status: DC

## 2020-01-21 MED ORDER — NITROGLYCERIN IN D5W 200-5 MCG/ML-% IV SOLN: 7.0000 ug/min | INTRAVENOUS | Status: DC

## 2020-01-21 MED ORDER — BUPIVACAINE HCL (PF) 0.5 % IJ SOLN
INTRAMUSCULAR | Status: AC
  Filled 2020-01-21: qty 30

## 2020-01-21 MED ORDER — TEMAZEPAM 15 MG PO CAPS: 15.0000 mg | ORAL_CAPSULE | Freq: Once | ORAL | Status: DC | PRN

## 2020-01-21 MED ORDER — FENTANYL CITRATE (PF) 250 MCG/5ML IJ SOLN
INTRAMUSCULAR | Status: DC | PRN
Start: 2020-01-21 — End: 2020-01-21
  Administered 2020-01-21: 100 ug via INTRAVENOUS
  Administered 2020-01-21: 50 ug via INTRAVENOUS
  Administered 2020-01-21: 100 ug via INTRAVENOUS
  Administered 2020-01-21: 50 ug via INTRAVENOUS
  Administered 2020-01-21 (×2): 150 ug via INTRAVENOUS
  Administered 2020-01-21 (×2): 100 ug via INTRAVENOUS
  Administered 2020-01-21: 50 ug via INTRAVENOUS
  Administered 2020-01-21: 350 ug via INTRAVENOUS
  Administered 2020-01-21: 50 ug via INTRAVENOUS

## 2020-01-21 MED ORDER — HEMOSTATIC AGENTS (NO CHARGE) OPTIME
TOPICAL | Status: DC | PRN
  Administered 2020-01-21 (×5): 1 via TOPICAL

## 2020-01-21 MED ORDER — SODIUM CHLORIDE 0.9 % IV SOLN: INTRAVENOUS | Status: DC

## 2020-01-21 MED ORDER — LACTATED RINGERS IV SOLN: INTRAVENOUS | Status: DC

## 2020-01-21 MED ORDER — LACTATED RINGERS IV SOLN
INTRAVENOUS | Status: DC
  Administered 2020-01-21: 20 mL/h via INTRAVENOUS

## 2020-01-21 MED ORDER — MUPIROCIN 2 % EX OINT
1.0000 "application " | TOPICAL_OINTMENT | Freq: Two times a day (BID) | CUTANEOUS | Status: DC
  Administered 2020-01-21 – 2020-01-24 (×8): 1 via NASAL
  Filled 2020-01-21 (×2): qty 22

## 2020-01-21 MED ORDER — SODIUM CHLORIDE 0.9 % IV SOLN
1.5000 g | Freq: Two times a day (BID) | INTRAVENOUS | Status: AC
  Administered 2020-01-21 – 2020-01-23 (×4): 1.5 g via INTRAVENOUS
  Filled 2020-01-21 (×4): qty 1.5

## 2020-01-21 MED ORDER — DOCUSATE SODIUM 100 MG PO CAPS
200.0000 mg | ORAL_CAPSULE | Freq: Every day | ORAL | Status: DC
  Administered 2020-01-22 – 2020-01-25 (×3): 200 mg via ORAL
  Filled 2020-01-21 (×3): qty 2

## 2020-01-21 MED ORDER — BISACODYL 10 MG RE SUPP: 10.0000 mg | Freq: Every day | RECTAL | Status: DC

## 2020-01-21 MED ORDER — VANCOMYCIN HCL 1000 MG IV SOLR
INTRAVENOUS | Status: DC | PRN
  Administered 2020-01-21: 3000 mg

## 2020-01-21 MED ORDER — FAMOTIDINE IN NACL 20-0.9 MG/50ML-% IV SOLN
20.0000 mg | Freq: Two times a day (BID) | INTRAVENOUS | Status: AC
  Administered 2020-01-21: 20 mg via INTRAVENOUS
  Filled 2020-01-21: qty 50

## 2020-01-21 SURGICAL SUPPLY — 127 items
ADAPTER CARDIO PERF ANTE/RETRO (ADAPTER) ×4 IMPLANT
APPLIER CLIP 9.375 SM OPEN (CLIP)
BAG DECANTER FOR FLEXI CONT (MISCELLANEOUS) ×4 IMPLANT
BLADE CLIPPER SURG (BLADE) IMPLANT
BLADE STERNUM SYSTEM 6 (BLADE) ×4 IMPLANT
BLADE SURG 15 STRL LF DISP TIS (BLADE) ×3 IMPLANT
BLADE SURG 15 STRL SS (BLADE) ×4
BNDG ELASTIC 4X5.8 VLCR STR LF (GAUZE/BANDAGES/DRESSINGS) ×4 IMPLANT
BNDG ELASTIC 6X5.8 VLCR STR LF (GAUZE/BANDAGES/DRESSINGS) IMPLANT
BNDG GAUZE ELAST 4 BULKY (GAUZE/BANDAGES/DRESSINGS) ×4 IMPLANT
CANISTER SUCT 3000ML PPV (MISCELLANEOUS) ×4 IMPLANT
CANNULA NON VENT 20FR 12 (CANNULA) ×4 IMPLANT
CATH CPB KIT HENDRICKSON (MISCELLANEOUS) ×4 IMPLANT
CATH RETROPLEGIA CORONARY 14FR (CATHETERS) ×4 IMPLANT
CATH ROBINSON RED A/P 18FR (CATHETERS) ×12 IMPLANT
CLIP APPLIE 9.375 SM OPEN (CLIP) IMPLANT
CLIP RETRACTION 3.0MM CORONARY (MISCELLANEOUS) ×4 IMPLANT
CLIP VESOCCLUDE MED 24/CT (CLIP) ×8 IMPLANT
CLIP VESOCCLUDE SM WIDE 24/CT (CLIP) IMPLANT
CONN ST 1/4X3/8  BEN (MISCELLANEOUS) ×12
CONN ST 1/4X3/8 BEN (MISCELLANEOUS) ×9 IMPLANT
COVER MAYO STAND STRL (DRAPES) ×4 IMPLANT
CUFF TOURN SGL QUICK 18X4 (TOURNIQUET CUFF) IMPLANT
CUFF TOURN SGL QUICK 24 (TOURNIQUET CUFF)
CUFF TRNQT CYL 24X4X16.5-23 (TOURNIQUET CUFF) IMPLANT
DERMABOND ADHESIVE PROPEN (GAUZE/BANDAGES/DRESSINGS) ×1
DERMABOND ADVANCED (GAUZE/BANDAGES/DRESSINGS)
DERMABOND ADVANCED .7 DNX12 (GAUZE/BANDAGES/DRESSINGS) IMPLANT
DERMABOND ADVANCED .7 DNX6 (GAUZE/BANDAGES/DRESSINGS) ×3 IMPLANT
DRAIN CHANNEL 28F RND 3/8 FF (WOUND CARE) ×12 IMPLANT
DRAPE CARDIOVASCULAR INCISE (DRAPES) ×4
DRAPE EXTREMITY T 121X128X90 (DISPOSABLE) ×4 IMPLANT
DRAPE HALF SHEET 40X57 (DRAPES) ×4 IMPLANT
DRAPE SLUSH/WARMER DISC (DRAPES) ×4 IMPLANT
DRAPE SRG 135X102X78XABS (DRAPES) ×3 IMPLANT
DRSG AQUACEL AG ADV 3.5X14 (GAUZE/BANDAGES/DRESSINGS) ×4 IMPLANT
ELECT CAUTERY BLADE 6.4 (BLADE) ×4 IMPLANT
ELECT REM PT RETURN 9FT ADLT (ELECTROSURGICAL) ×8
ELECTRODE REM PT RTRN 9FT ADLT (ELECTROSURGICAL) ×6 IMPLANT
FELT TEFLON 1X6 (MISCELLANEOUS) ×4 IMPLANT
GAUZE SPONGE 4X4 12PLY STRL (GAUZE/BANDAGES/DRESSINGS) ×8 IMPLANT
GAUZE SPONGE 4X4 12PLY STRL LF (GAUZE/BANDAGES/DRESSINGS) ×8 IMPLANT
GEL ULTRASOUND 20GR AQUASONIC (MISCELLANEOUS) IMPLANT
GLOVE BIO SURGEON STRL SZ 6.5 (GLOVE) ×16 IMPLANT
GLOVE BIOGEL PI IND STRL 6 (GLOVE) ×9 IMPLANT
GLOVE BIOGEL PI IND STRL 6.5 (GLOVE) ×3 IMPLANT
GLOVE BIOGEL PI IND STRL 7.5 (GLOVE) ×6 IMPLANT
GLOVE BIOGEL PI INDICATOR 6 (GLOVE) ×3
GLOVE BIOGEL PI INDICATOR 6.5 (GLOVE) ×1
GLOVE BIOGEL PI INDICATOR 7.5 (GLOVE) ×2
GLOVE NEODERM STRL 7.5  LF PF (GLOVE) ×9
GLOVE NEODERM STRL 7.5 LF PF (GLOVE) ×9 IMPLANT
GLOVE SURG NEODERM 7.5  LF PF (GLOVE) ×3
GOWN STRL REUS W/ TWL LRG LVL3 (GOWN DISPOSABLE) ×33 IMPLANT
GOWN STRL REUS W/TWL LRG LVL3 (GOWN DISPOSABLE) ×44
HEMOSTAT POWDER SURGIFOAM 1G (HEMOSTASIS) ×8 IMPLANT
INSERT FOGARTY 61MM (MISCELLANEOUS) ×4 IMPLANT
INSERT SUTURE HOLDER (MISCELLANEOUS) ×4 IMPLANT
IV ADAPTER SYR DOUBLE MALE LL (MISCELLANEOUS) ×4 IMPLANT
KIT APPLICATOR RATIO 11:1 (KITS) ×8 IMPLANT
KIT BASIN OR (CUSTOM PROCEDURE TRAY) ×4 IMPLANT
KIT SUCTION CATH 14FR (SUCTIONS) ×4 IMPLANT
KIT TURNOVER KIT B (KITS) ×4 IMPLANT
KIT VASOVIEW HEMOPRO 2 VH 4000 (KITS) IMPLANT
MARKER GRAFT CORONARY BYPASS (MISCELLANEOUS) ×4 IMPLANT
MARKER SKIN DUAL TIP RULER LAB (MISCELLANEOUS) ×4 IMPLANT
NEEDLE 18GX1X1/2 (RX/OR ONLY) (NEEDLE) ×8 IMPLANT
NEEDLE HYPO 18GX1.5 BLUNT FILL (NEEDLE) ×8 IMPLANT
NS IRRIG 1000ML POUR BTL (IV SOLUTION) ×20 IMPLANT
PACK E OPEN HEART (SUTURE) ×4 IMPLANT
PACK OPEN HEART (CUSTOM PROCEDURE TRAY) ×4 IMPLANT
PACK PLATELET PROCEDURE 60 (MISCELLANEOUS) ×4 IMPLANT
PACK SPY-PHI (KITS) ×4 IMPLANT
PAD ARMBOARD 7.5X6 YLW CONV (MISCELLANEOUS) ×8 IMPLANT
PAD ELECT DEFIB RADIOL ZOLL (MISCELLANEOUS) ×4 IMPLANT
PENCIL BUTTON HOLSTER BLD 10FT (ELECTRODE) ×4 IMPLANT
POSITIONER HEAD DONUT 9IN (MISCELLANEOUS) ×4 IMPLANT
POWDER SURGICEL 3.0 GRAM (HEMOSTASIS) ×4 IMPLANT
PUNCH AORTIC 3.0MM LNG HANDLE (MISCELLANEOUS) IMPLANT
PUNCH AORTIC ROTATE 4.5MM 8IN (MISCELLANEOUS) ×4 IMPLANT
SEALANT PATCH FIBRIN 2X4IN (MISCELLANEOUS) ×4 IMPLANT
SEALANT SURG COSEAL 8ML (VASCULAR PRODUCTS) ×4 IMPLANT
SET CARDIOPLEGIA MPS 5001102 (MISCELLANEOUS) ×8 IMPLANT
SHEARS HARMONIC 9CM CVD (BLADE) ×4 IMPLANT
STAPLER VISISTAT 35W (STAPLE) ×4 IMPLANT
SUPPORT HEART JANKE-BARRON (MISCELLANEOUS) ×4 IMPLANT
SUT BONE WAX W31G (SUTURE) ×4 IMPLANT
SUT MNCRL AB 3-0 PS2 18 (SUTURE) ×8 IMPLANT
SUT PDS AB 1 CTX 36 (SUTURE) ×8 IMPLANT
SUT PROLENE 3 0 SH DA (SUTURE) ×16 IMPLANT
SUT PROLENE 4 0 RB 1 (SUTURE) ×4
SUT PROLENE 4-0 RB1 .5 CRCL 36 (SUTURE) ×3 IMPLANT
SUT PROLENE 5 0 C 1 36 (SUTURE) IMPLANT
SUT PROLENE 6 0 C 1 30 (SUTURE) ×8 IMPLANT
SUT PROLENE 7 0 BV 1 (SUTURE) ×16 IMPLANT
SUT PROLENE 8 0 BV175 6 (SUTURE) ×4 IMPLANT
SUT PROLENE BLUE 7 0 (SUTURE) ×4 IMPLANT
SUT SILK  1 MH (SUTURE) ×4
SUT SILK 1 MH (SUTURE) ×3 IMPLANT
SUT SILK 2 0 SH CR/8 (SUTURE) IMPLANT
SUT SILK 3 0 SH CR/8 (SUTURE) IMPLANT
SUT STEEL 6MS V (SUTURE) ×4 IMPLANT
SUT STEEL SZ 6 DBL 3X14 BALL (SUTURE) ×4 IMPLANT
SUT VIC AB 2-0 CT1 27 (SUTURE) ×4
SUT VIC AB 2-0 CT1 TAPERPNT 27 (SUTURE) ×3 IMPLANT
SUT VIC AB 2-0 CTX 27 (SUTURE) IMPLANT
SUT VIC AB 3-0 SH 27 (SUTURE)
SUT VIC AB 3-0 SH 27X BRD (SUTURE) IMPLANT
SUT VIC AB 3-0 X1 27 (SUTURE) IMPLANT
SYR 10ML LL (SYRINGE) ×4 IMPLANT
SYR 20ML LL LF (SYRINGE) ×4 IMPLANT
SYR 30ML LL (SYRINGE) ×4 IMPLANT
SYR 3ML LL SCALE MARK (SYRINGE) IMPLANT
SYR 50ML SLIP (SYRINGE) IMPLANT
SYR BULB IRRIG 60ML STRL (SYRINGE) ×8 IMPLANT
SYSTEM SAHARA CHEST DRAIN ATS (WOUND CARE) ×4 IMPLANT
TAPE CLOTH SURG 4X10 WHT LF (GAUZE/BANDAGES/DRESSINGS) ×4 IMPLANT
TAPE PAPER 2X10 WHT MICROPORE (GAUZE/BANDAGES/DRESSINGS) ×4 IMPLANT
TIP DUAL SPRAY TOPICAL (TIP) ×8 IMPLANT
TOWEL GREEN STERILE (TOWEL DISPOSABLE) ×4 IMPLANT
TOWEL GREEN STERILE FF (TOWEL DISPOSABLE) ×4 IMPLANT
TRAY FOLEY SLVR 16FR TEMP STAT (SET/KITS/TRAYS/PACK) ×4 IMPLANT
TUBING ART PRESS 48 MALE/FEM (TUBING) ×8 IMPLANT
TUBING LAP HI FLOW INSUFFLATIO (TUBING) IMPLANT
UNDERPAD 30X36 HEAVY ABSORB (UNDERPADS AND DIAPERS) ×8 IMPLANT
WATER STERILE IRR 1000ML POUR (IV SOLUTION) ×8 IMPLANT
WATER STERILE IRR 1000ML UROMA (IV SOLUTION) IMPLANT

## 2020-01-21 NOTE — OR Nursing (Signed)
Blood is processed and centrifuged by Perfusion to obtain Platelet Rich Plasm (PRP) and Platelet Poor Plasma (PPP). Each product is drawn up in a different syringe and transferred to the sterile field.   1st - A mixture of 5 mL of 10% Calcium Chloride Injection 0.5g/71mL (Exp: 06/2021, Lot: [id number]) will be combined with all of the Thrombin 5,000 units powder only (Do not add saline from kit!!)  2nd The scrub person will mix 10 mL of PRP with a 1 mL of the Thrombin/Calcium Chloride mixture in one applicator syringe.  3rd - The scrub person will mix 10 mL of PPP 1 mL of the Thrombin/Calcium Chloride mix on the second applicator syring. These mixtures are applied to sternal edges and left arm incision sites as needed by the surgeon intra-operatively.

## 2020-01-21 NOTE — Anesthesia Procedure Notes (Addendum)
Arterial Line Insertion Start/End9/06/2019 6:45 AM, 01/21/2020 6:58 AM Performed by: Achille Rich, MD, Macie Burows, CRNA, CRNA  Patient location: Pre-op. Preanesthetic checklist: patient identified, IV checked, site marked, risks and benefits discussed, surgical consent, monitors and equipment checked, pre-op evaluation, timeout performed and anesthesia consent Lidocaine 1% used for infiltration Right, radial was placed Catheter size: 20 G Hand hygiene performed  and maximum sterile barriers used   Attempts: 1 Procedure performed without using ultrasound guided technique. Following insertion, dressing applied and Biopatch. Post procedure assessment: normal  Patient tolerated the procedure well with no immediate complications.

## 2020-01-21 NOTE — Op Note (Signed)
CARDIOTHORACIC SURGERY OPERATIVE NOTE  Date of Procedure: 01/21/2020  Preoperative Diagnosis: Severe 3-vessel Coronary Artery Disease  Postoperative Diagnosis: Same  Procedure:    Coronary Artery Bypass Grafting x 4  Left Internal Mammary Artery to Distal Left Anterior Descending Coronary Artery, pedicled right internal mammary artery graft to left posterior Descending Coronary Artery; left radial artery graft to first obtuse Marginal Branch and diagonal Branch Coronary Artery as a sequenced graft Open left radial artery harvesting Bilateral internal mammary artery harvesting Completion graft surveillance with indocyanine green fluorescence imaging   Surgeon: B.  Lorayne Marek, MD  Assistant: Jacques Earthly PA-C  Anesthesia: General  Operative Findings:  Preserved left ventricular systolic function  Good quality internal mammary artery conduits; small size  Good quality radial artery conduit  Good quality target vessels for grafting    BRIEF CLINICAL NOTE AND INDICATIONS FOR SURGERY  64 year old gentleman presented with progressive angina.  He has a past medical history of coronary artery disease.  He presented with rest pain on 01/19/2020 and given his history underwent left heart catheterization.  This demonstrated severe multivessel disease.  Patient is referred for CABG.  He has been thoroughly evaluated and is considered a good candidate for the operation.   DETAILS OF THE OPERATIVE PROCEDURE  Preparation:  The patient is brought to the operating room on the above mentioned date and central monitoring was established by the anesthesia team including placement of Swan-Ganz catheter and radial arterial line. The patient is placed in the supine position on the operating table.  Intravenous antibiotics are administered. General endotracheal anesthesia is induced uneventfully. A Foley catheter is placed.  Baseline transesophageal echocardiogram was performed.  Findings were  notable for thickened LV and intact valvular function.  There is preserved LV function  The patient's chest, abdomen, both groins, the left upper extremity, and both lower extremities are prepared and draped in a sterile manner. A time out procedure is performed.   Surgical Approach and Conduit Harvest: attention is first turned to the left forearm where an incision is made overlying the radial artery pulse.  The patient had previously undergone assessment for simulating left radial artery removal which appeared appropriate.  The harmonic scalpel was used to perform open left radial artery harvesting.  The distal and proximal ends of the radial graft were secured with sutures and clips.  The graft was placed in a solution of papaverine.  The arm was then closed in layers and tucked at the side.   A median sternotomy incision was performed and the left internal mammary artery is dissected from the chest wall and prepared for bypass grafting. The left internal mammary artery is notably good quality conduit.  Next attention is turned to the right hemithorax of the right internal mammary artery and its pedicle mobilized in a standard fashion.  Prior to dividing the helical distally, full dose heparin is given intravenously.  Following systemic heparinization, the internal mammary arteries were transected distally noted to have excellent flow.  They were each treated with a solution of papaverine.  Multilevel rib block was undertaken through the exposed intercostal spaces with Exparel solution.   Extracorporeal Cardiopulmonary Bypass and Myocardial Protection:  The pericardium is opened. The ascending aorta is nondiseased in appearance. The ascending aorta and the right atrium are cannulated for cardiopulmonary bypass.  Adequate heparinization is verified. A retrograde cardioplegia cannula is placed through the right atrium into the coronary sinus.  The entire pre-bypass portion of the operation was notable  for  stable hemodynamics.  Cardiopulmonary bypass was begun and the surface of the heart is inspected. Distal target vessels are selected for coronary artery bypass grafting. A cardioplegia cannula is placed in the ascending aorta.  The patient is allowed to cool passively to 34C systemic temperature.  The aortic cross clamp is applied and cold blood cardioplegia is delivered initially in an antegrade fashion through the aortic root.  Supplemental cardioplegia is given retrograde through the coronary sinus catheter.  Iced saline slush is applied for topical hypothermia.  The initial cardioplegic arrest is rapid with early diastolic arrest.  Repeat doses of cardioplegia are administered intermittently throughout the entire cross clamp portion of the operation through the aortic root, through the coronary sinus catheter, and through subsequently placed  grafts in order to maintain completely flat electrocardiogram.   Coronary Artery Bypass Grafting:   The  posterior descending branch of the left coronary artery was grafted using the pedicled right internal mammary artery graft which was routed through the oblique sinus.  The anastomosis was done in an end-to-side fashion.  At the site of distal anastomosis the target vessel was good quality and measured approximately 1.5 mm in diameter.Anastomotic patency and runoff was confirmed with indocyanine green fluorescence imaging (SPY).  The first obtuse marginal branch of the left circumflex coronary artery and the diagonal artery were grafted using the left radial artery graft in a sequenced fashion.  At the site of distal anastomosis the target vessels were good quality and measured approximately 1.5 mm in diameter.    The distal left anterior coronary artery was grafted with the left internal mammary artery in an end-to-side fashion.  At the site of distal anastomosis the target vessel was good quality and measured approximately 1.5 mm in diameter.  Anastomotic patency and runoff was confirmed with indocyanine green fluorescence imaging (SPY).  The proximal radial graft anastomosis was placed directly to the ascending aorta prior to removal of the aortic cross clamp.  De-airing procedures were performed and the aortic cross-clamp was removed   Procedure Completion:  All proximal and distal coronary anastomoses were inspected for hemostasis and appropriate graft orientation. Epicardial pacing wires are fixed to the right ventricular outflow tract and to the right atrial appendage. The patient is rewarmed to 37C temperature. The patient is weaned and disconnected from cardiopulmonary bypass.  The patient's rhythm at separation from bypass was normal sinus.  The patient was weaned from cardiopulmonary bypass  without any inotropic support. T  Followup transesophageal echocardiogram performed after separation from bypass revealed no changes from the preoperative exam.  The aortic and venous cannula were removed uneventfully. Protamine was administered to reverse the anticoagulation. The mediastinum and pleural space were inspected for hemostasis and irrigated with saline solution. The mediastinum and bilateral pleural spaces were drained using fluted chest tubes placed through separate stab incisions inferiorly.  The soft tissues anterior to the aorta were reapproximated loosely. The sternum is closed with double strength sternal wire. The soft tissues anterior to the sternum were closed in multiple layers and the skin is closed with a running subcuticular skin closure.  The post-bypass portion of the operation was notable for stable rhythm and hemodynamics. No blood products were administered during the operation.   Disposition:  The patient tolerated the procedure well and is transported to the surgical intensive care in stable condition. There are no intraoperative complications. All sponge instrument and needle counts are verified correct at  completion of the operation.    BLorayne Marek,  MD 01/21/2020 2:51 PM

## 2020-01-21 NOTE — Transfer of Care (Signed)
Immediate Anesthesia Transfer of Care Note  Patient: Todd Gonzalez  Procedure(s) Performed: CORONARY ARTERY BYPASS GRAFTING (CABG) TIMES 4 USING LIMA TO LAD; RIMA TO PDA; RADIAL HARVEST TO DIAG AND RAMUS. (N/A Chest) RADIAL ARTERY HARVEST (Left Arm Lower) TRANSESOPHAGEAL ECHOCARDIOGRAM (TEE) (N/A ) INDOCYANINE GREEN FLUORESCENCE IMAGING (ICG) (N/A )  Patient Location: ICU  Anesthesia Type:General  Level of Consciousness: Patient remains intubated per anesthesia plan  Airway & Oxygen Therapy: Patient remains intubated per anesthesia plan and Patient placed on Ventilator (see vital sign flow sheet for setting)  Post-op Assessment: Report given to RN and Post -op Vital signs reviewed and stable  Post vital signs: Reviewed and stable  Last Vitals:  Vitals Value Taken Time  BP    Temp    Pulse    Resp    SpO2      Last Pain:  Vitals:   01/21/20 0333  TempSrc: Oral  PainSc:          Complications: No complications documented.

## 2020-01-21 NOTE — Hospital Course (Addendum)
HPI: Todd Gonzalez is a 64 year old male patient with a past medical history of hypertension, known CAD with previous ruptured plaque in the OM in 2003,  right BBB, hyperlipidemia and headaches who presented  to the [facility] ED yesterday with a 1-2 month history of exertional chest pain. He did not have any associated symptoms. The pain can be reproduced with minimal exertion and usually subsides in 10 minutes to hours. On the morning of 01/19/2020 he woke up at 5am from the pain and took 2 baby aspirin with resolution of symptoms after about 2 hours. Due to risk factors and known CAD he underwent cardiac catheterization which showed: 30% stenosis of the mid to distal left main, 70% stenosis of the ostial to mid circumflex, 60% stenosis of the side branch in the first marginal, 95% stenosis of the mid to distal circumflex which was initially 100% occluded but opened up somewhat with injections, 70% stenosis of the mid LAD lesion with a 70% stenosed side branch and second diagonal, and 75% stenosis of the distal LAD.  The estimated left ventricular ejection fraction is 55 to 65% by visual estimate. IV heparin was initiated. We were consulted for consideration of coronary artery bypass grafting surgery. Potential risks, benefits, and complications of the surgery were discussed with the patient and he agreed to proceed with surgery. Pre operative carotid duplex US showed no significant internal carotid artery stenosis bilaterally. Patient underwent a CABG x 4 on 01/21/2020.   Hospital Course:  On postoperative day #1 the patient is doing well.  He was extubated using standard protocols without difficulty.  He has remained hemodynamically stable with some hypertension managed with intravenous nitroglycerin.  As he is a radial artery harvest he is started on p.o. Imdur.  Additionally he is started on colchicine for anti-inflammatory effect.  Pain was somewhat poorly controlled initially but has improved with the  addition of Toradol.  Chest tubes had moderate drainage and they were kept in place postoperative day #1.  He does have an expected acute blood loss anemia with postoperative day #1 hemoglobin and hematocrit 9.6/29.4 respectively.  He does have some volume overload but is doing well with spontaneous diuresis.  Renal function is within normal limits.  Blood sugars are well controlled.  He is tolerating routine advancement in activities including removal of invasive lines.  He is started on a course of routine pulmonary toilet and cardiac rehabilitation.

## 2020-01-21 NOTE — Progress Notes (Signed)
  Echocardiogram Echocardiogram Transesophageal has been performed.  Todd Gonzalez M 01/21/2020, 8:22 AM 

## 2020-01-21 NOTE — H&P (Signed)
History and Physical Interval Note:  01/21/2020 7:30 AM  Todd Gonzalez  has presented today for surgery, with the diagnosis of CAD.  The various methods of treatment have been discussed with the patient and family. After consideration of risks, benefits and other options for treatment, the patient has consented to  Procedure(s) with comments: CORONARY ARTERY BYPASS GRAFTING (CABG) (N/A) - BILATERAL IMA RADIAL ARTERY HARVEST (Left) TRANSESOPHAGEAL ECHOCARDIOGRAM (TEE) (N/A) INDOCYANINE GREEN FLUORESCENCE IMAGING (ICG) (N/A) as a surgical intervention.  The patient's history has been reviewed, patient examined, no change in status, stable for surgery.  I have reviewed the patient's chart and labs.  Questions were answered to the patient's satisfaction.    I have personally performed modified Allen's test of the left upper extremity/hand perfusion with plethysmography simulating left radial artery harvesting. This shows good residual perfusion of the palm via the left ulnar artery and it is therefore considered safe to use the left radial artery as a conduit.    Todd Gonzalez

## 2020-01-21 NOTE — Anesthesia Procedure Notes (Addendum)
Procedure Name: Intubation Date/Time: 01/21/2020 7:52 AM Performed by: Macie Burows, CRNA Pre-anesthesia Checklist: Patient identified, Emergency Drugs available, Suction available and Patient being monitored Patient Re-evaluated:Patient Re-evaluated prior to induction Oxygen Delivery Method: Circle system utilized Preoxygenation: Pre-oxygenation with 100% oxygen Induction Type: IV induction Ventilation: Mask ventilation without difficulty Laryngoscope Size: Miller and 2 Grade View: Grade II Tube type: Oral Tube size: 8.0 mm Number of attempts: 1 Airway Equipment and Method: Stylet and Oral airway Placement Confirmation: ETT inserted through vocal cords under direct vision,  positive ETCO2 and breath sounds checked- equal and bilateral Secured at: 22 cm Tube secured with: Tape Dental Injury: Teeth and Oropharynx as per pre-operative assessment  Comments: Inserted by Tomasita Morrow, SRNA under direct supervision.

## 2020-01-21 NOTE — Progress Notes (Signed)
      [address]       [place] [address]             [phone]      S/p CABG x 4  Intubated, sedated BP 135/80   Pulse 94   Temp (!) 96.6 F (35.9 C)   Resp 10   Ht 5\' 6"  (1.676 m)   Wt 71.3 kg   SpO2 100%   BMI 25.36 kg/m  Co= 2.4, CI= 1.3  Intake/Output Summary (Last 24 hours) at 01/21/2020 1726 Last data filed at 01/21/2020 1700 Gross per 24 hour  Intake 4671 ml  Output 2965 ml  Net 1706 ml   Hct= 30, K= 4.3 CXR shows possible trace pneumo on right- has Ct in place  Volume resuscitation for low index  03/22/2020 C. Viviann Spare, MD [facility] [phone]

## 2020-01-21 NOTE — Progress Notes (Signed)
RT attempted to retreive abg from pt. But pt. Could not tolerate the procedure. Pt. Became angry from the stick and jerked his arm away stating to forget it. RT explained the importance of having a abg done for his procedure but pt. Continued to refuse the stick. RN notified.

## 2020-01-21 NOTE — Brief Op Note (Addendum)
01/19/2020 - 01/21/2020  12:13 PM  PATIENT:  Todd Gonzalez  63 y.o. male  PRE-OPERATIVE DIAGNOSIS:  1. S/P NSTEMI 2.CORONARY ARTERY DISEASE  POST-OPERATIVE DIAGNOSIS:  1. S/p NSTEMI 2.CORONARY ARTERY DISEASE  PROCEDURE:  TRANSESOPHAGEAL ECHOCARDIOGRAM (TEE), CORONARY ARTERY BYPASS GRAFTING (CABG) x 4 (LIMA to LAD, LEFT RADIAL ARTERY SEQUENTIALLY to DIAGONAL and RAMUS INTERMEDIATE, RIMA to LEFT PDA) with  BILATERAL INTERNAL MAMMARY ARTERIES and LEFT RADIAL ARTERY HARVEST  And INDOCYANINE GREEN FLUORESCENCE IMAGING (ICG)   LEFT RADIAL HARVEST TIME: 20 minutes and LEFT RADIAL ARTERY PREP TIME: 5 minutes  SURGEON:  Surgeon(s) and Role:    [name], MD - Primary  PHYSICIAN ASSISTANT: Doree Fudge PA-C  ASSISTANTS:Terry Wagoner RNFA  ANESTHESIA:   general  EBL:  Per anesthesia and perfusion record  DRAINS: Chest tubes placed in the mediastianl and pleural spaces   LOCAL MEDICATIONS USED:  OTHER Exparel  COUNTS CORRECT:  YES  DICTATION: .Dragon Dictation  PLAN OF CARE: Admit to inpatient   PATIENT DISPOSITION:  ICU - intubated and hemodynamically stable.   Delay start of Pharmacological VTE agent (>24hrs) due to surgical blood loss or risk of bleeding: yes  BASELINE WEIGHT: 71.3 kg  Agree with documentation. Samaira Holzworth Z. Vickey Sages, MD [phone]

## 2020-01-21 NOTE — Anesthesia Preprocedure Evaluation (Signed)
Anesthesia Evaluation  Patient identified by MRN, date of birth, ID band Patient awake    Reviewed: Allergy & Precautions, H&P , NPO status , Patient's Chart, lab work & pertinent test results  Airway Mallampati: II   Neck ROM: full    Dental   Pulmonary Current Smoker,    breath sounds clear to auscultation       Cardiovascular hypertension, + CAD and + Past MI  + dysrhythmias  Rhythm:regular Rate:Normal  EF 60%. Basal inferolateral HK. Normal valves   Neuro/Psych    GI/Hepatic   Endo/Other    Renal/GU      Musculoskeletal   Abdominal   Peds  Hematology   Anesthesia Other Findings   Reproductive/Obstetrics                             Anesthesia Physical Anesthesia Plan  ASA: III  Anesthesia Plan: General   Post-op Pain Management:    Induction: Intravenous  PONV Risk Score and Plan: 1 and Ondansetron, Dexamethasone, Midazolam and Treatment may vary due to age or medical condition  Airway Management Planned: Oral ETT  Additional Equipment: Arterial line, CVP, PA Cath, TEE and Ultrasound Guidance Line Placement  Intra-op Plan:   Post-operative Plan: Post-operative intubation/ventilation  Informed Consent: I have reviewed the patients History and Physical, chart, labs and discussed the procedure including the risks, benefits and alternatives for the proposed anesthesia with the patient or authorized representative who has indicated his/her understanding and acceptance.       Plan Discussed with: CRNA, Anesthesiologist and Surgeon  Anesthesia Plan Comments:         Anesthesia Quick Evaluation

## 2020-01-21 NOTE — Procedures (Signed)
Extubation Procedure Note  Patient Details:   Name: Todd Gonzalez DOB: 11/06/1955 MRN: [id number]   Airway Documentation:  Airway 8 mm (Active)  Secured at (cm) 23 cm 01/21/20 2025  Measured From Lips 01/21/20 2025  Secured Location Right 01/21/20 2025  Secured By [employer] Tape 01/21/20 2025  Cuff Pressure (cm H2O) 30 cm H2O 01/21/20 2025  Site Condition Dry 01/21/20 2025   Vent end date: (not recorded) Vent end time: (not recorded)   Evaluation  O2 sats: stable throughout Complications: No apparent complications Patient did tolerate procedure well. Bilateral Breath Sounds: Diminished, clear   Yes  VC- 0.8, NIF -30. Pt. Able to perform commands, ABG within limits.  Pt. Extubated without any complications on 4LNC sat 100% . No stridor noted,pt was able to say name after extubation. lungs sound bilateral clear. RR-23. RT will continue to monitor.  Milta Deiters 01/21/2020, 9:37 PM

## 2020-01-21 NOTE — Anesthesia Procedure Notes (Signed)
Central Venous Catheter Insertion Performed by: Achille Rich, MD, anesthesiologist Start/End9/06/2019 6:52 AM, 01/21/2020 7:06 AM Patient location: Pre-op. Preanesthetic checklist: patient identified, IV checked, site marked, risks and benefits discussed, surgical consent, monitors and equipment checked, pre-op evaluation, timeout performed and anesthesia consent Position: Trendelenburg Lidocaine 1% used for infiltration and patient sedated Hand hygiene performed , maximum sterile barriers used  and Seldinger technique used Catheter size: 8.5 Fr Central line and PA cath was placed.Sheath introducer Swan type:thermodilation Procedure performed using ultrasound guided technique. Ultrasound Notes:anatomy identified, needle tip was noted to be adjacent to the nerve/plexus identified, no ultrasound evidence of intravascular and/or intraneural injection and image(s) printed for medical record Attempts: 1 Following insertion, line sutured, dressing applied and Biopatch. Post procedure assessment: blood return through all ports, free fluid flow and no air  Patient tolerated the procedure well with no immediate complications.

## 2020-01-22 ENCOUNTER — Encounter (HOSPITAL_COMMUNITY): Payer: Self-pay | Admitting: Cardiothoracic Surgery

## 2020-01-22 ENCOUNTER — Telehealth: Payer: Self-pay | Admitting: Physician Assistant

## 2020-01-22 ENCOUNTER — Inpatient Hospital Stay (HOSPITAL_COMMUNITY): Payer: Medicaid Other

## 2020-01-22 DIAGNOSIS — I249 Acute ischemic heart disease, unspecified: Secondary | ICD-10-CM

## 2020-01-22 LAB — GLUCOSE, CAPILLARY
Glucose-Capillary: 122 mg/dL — ABNORMAL HIGH (ref 70–99)
Glucose-Capillary: 127 mg/dL — ABNORMAL HIGH (ref 70–99)
Glucose-Capillary: 128 mg/dL — ABNORMAL HIGH (ref 70–99)
Glucose-Capillary: 153 mg/dL — ABNORMAL HIGH (ref 70–99)
Glucose-Capillary: 164 mg/dL — ABNORMAL HIGH (ref 70–99)
Glucose-Capillary: 175 mg/dL — ABNORMAL HIGH (ref 70–99)

## 2020-01-22 LAB — BASIC METABOLIC PANEL
Anion gap: 7 (ref 5–15)
Anion gap: 9 (ref 5–15)
BUN: 9 mg/dL (ref 8–23)
BUN: 12 mg/dL (ref 8–23)
CO2: 19 mmol/L — ABNORMAL LOW (ref 22–32)
CO2: 20 mmol/L — ABNORMAL LOW (ref 22–32)
Calcium: 8.3 mg/dL — ABNORMAL LOW (ref 8.9–10.3)
Calcium: 8.2 mg/dL — ABNORMAL LOW (ref 8.9–10.3)
Chloride: 109 mmol/L (ref 98–111)
Chloride: 105 mmol/L (ref 98–111)
Creatinine, Ser: 0.96 mg/dL (ref 0.61–1.24)
Creatinine, Ser: 0.95 mg/dL (ref 0.61–1.24)
GFR calc Af Amer: >60 mL/min (ref >60)
GFR calc Af Amer: >60 mL/min (ref >60)
GFR calc non Af Amer: >60 mL/min (ref >60)
GFR calc non Af Amer: >60 mL/min (ref >60)
Glucose, Bld: 128 mg/dL — ABNORMAL HIGH (ref 70–99)
Glucose, Bld: 143 mg/dL — ABNORMAL HIGH (ref 70–99)
Potassium: 4.6 mmol/L (ref 3.5–5.1)
Potassium: 4.2 mmol/L (ref 3.5–5.1)
Sodium: 135 mmol/L (ref 135–145)
Sodium: 134 mmol/L — ABNORMAL LOW (ref 135–145)

## 2020-01-22 LAB — CBC
HCT: 29.4 % — ABNORMAL LOW (ref 39.0–52.0)
HCT: 27.5 % — ABNORMAL LOW (ref 39.0–52.0)
Hemoglobin: 9.6 g/dL — ABNORMAL LOW (ref 13.0–17.0)
Hemoglobin: 9.3 g/dL — ABNORMAL LOW (ref 13.0–17.0)
MCH: 30.1 pg (ref 26.0–34.0)
MCH: 31.2 pg (ref 26.0–34.0)
MCHC: 32.7 g/dL (ref 30.0–36.0)
MCHC: 33.8 g/dL (ref 30.0–36.0)
MCV: 92.2 fL (ref 80.0–100.0)
MCV: 92.3 fL (ref 80.0–100.0)
Platelets: 178 10*3/uL (ref 150–400)
Platelets: 179 10*3/uL (ref 150–400)
RBC: 3.19 MIL/uL — ABNORMAL LOW (ref 4.22–5.81)
RBC: 2.98 MIL/uL — ABNORMAL LOW (ref 4.22–5.81)
RDW: 14.3 % (ref 11.5–15.5)
RDW: 14.6 % (ref 11.5–15.5)
WBC: 10.7 10*3/uL — ABNORMAL HIGH (ref 4.0–10.5)
WBC: 13.5 10*3/uL — ABNORMAL HIGH (ref 4.0–10.5)
nRBC: 0 % (ref 0.0–0.2)
nRBC: 0 % (ref 0.0–0.2)

## 2020-01-22 LAB — MAGNESIUM
Magnesium: 2.3 mg/dL (ref 1.7–2.4)
Magnesium: 2.4 mg/dL (ref 1.7–2.4)

## 2020-01-22 MED ORDER — METOPROLOL TARTRATE 25 MG/10 ML ORAL SUSPENSION: 12.5000 mg | Freq: Three times a day (TID) | ORAL | Status: DC

## 2020-01-22 MED ORDER — KETOROLAC TROMETHAMINE 15 MG/ML IJ SOLN
15.0000 mg | Freq: Four times a day (QID) | INTRAMUSCULAR | Status: AC
  Administered 2020-01-22 – 2020-01-23 (×5): 15 mg via INTRAVENOUS
  Filled 2020-01-22 (×5): qty 1

## 2020-01-22 MED ORDER — COLCHICINE 0.3 MG HALF TABLET
0.3000 mg | ORAL_TABLET | Freq: Two times a day (BID) | ORAL | Status: DC
  Administered 2020-01-22 (×2): 0.3 mg via ORAL
  Filled 2020-01-22 (×3): qty 1

## 2020-01-22 MED ORDER — ISOSORBIDE DINITRATE 10 MG PO TABS: 10.0000 mg | ORAL_TABLET | Freq: Three times a day (TID) | ORAL | Status: DC

## 2020-01-22 MED ORDER — ORAL CARE MOUTH RINSE
15.0000 mL | Freq: Two times a day (BID) | OROMUCOSAL | Status: DC
  Administered 2020-01-22 – 2020-01-23 (×4): 15 mL via OROMUCOSAL

## 2020-01-22 MED ORDER — THIAMINE HCL 100 MG/ML IJ SOLN
75.0000 mL/h | INTRAVENOUS | Status: AC
  Administered 2020-01-22: 75 mL/h via INTRAVENOUS
  Filled 2020-01-22: qty 1000

## 2020-01-22 MED ORDER — METOPROLOL TARTRATE 12.5 MG HALF TABLET
12.5000 mg | ORAL_TABLET | Freq: Three times a day (TID) | ORAL | Status: DC
  Administered 2020-01-22 – 2020-01-23 (×3): 12.5 mg via ORAL
  Filled 2020-01-22 (×3): qty 1

## 2020-01-22 MED ORDER — SODIUM CHLORIDE 0.9% FLUSH
10.0000 mL | Freq: Two times a day (BID) | INTRAVENOUS | Status: DC
  Administered 2020-01-22: 10 mL
  Administered 2020-01-22: 20 mL
  Administered 2020-01-23: 10 mL
  Administered 2020-01-25: 20 mL

## 2020-01-22 MED ORDER — SODIUM CHLORIDE 0.9% FLUSH: 10.0000 mL | INTRAVENOUS | Status: DC | PRN

## 2020-01-22 NOTE — Discharge Summary (Signed)
Physician Discharge Summary  Patient ID: Todd Gonzalez MRN: [id number] DOB/AGE: 1955/10/06 64 y.o.  Admit date: 01/19/2020 Discharge date: 01/26/2020  Admission Diagnoses: Severe multivessel coronary artery disease in the setting of non-STEMI.  Discharge Diagnoses:  Active Problems:   Coronary artery disease involving native coronary artery of native heart with unstable angina pectoris (HCC)   NSTEMI (non-ST elevated myocardial infarction) (HCC)   Acute coronary syndrome (HCC)   S/P CABG x 4   Coronary artery disease   Patient Active Problem List   Diagnosis Date Noted   S/P CABG x 4 01/21/2020   Coronary artery disease 01/21/2020   NSTEMI (non-ST elevated myocardial infarction) (HCC) 01/19/2020   Acute coronary syndrome (HCC)    ESSENTIAL HYPERTENSION, BENIGN 09/06/2008   Coronary artery disease involving native coronary artery of native heart with unstable angina pectoris (HCC) 09/06/2008   RBBB 09/06/2008   HEADACHE 09/06/2008   History of Present Illness:   at time of consultation   Todd Gonzalez is a 64 year old male patient with a past medical history of hypertension, known CAD with previous ruptured plaque in the OM in 2003,  right BBB, hyperlipidemia and headaches who presented  to the [facility] ED yesterday with a 1-2 month history of exertional chest pain. He did not have any associated symptoms. The pain can be reproduced with minimal exertion and usually subsides in 10 minutes to hours. On the morning of 01/19/2020 he woke up at 5am from the pain and took 2 baby aspirin with resolution of symptoms after about 2 hours. Due to risk factors and known CAD he underwent cardiac catheterization which showed: 30% stenosis of the mid to distal left main, 70% stenosis of the ostial to mid circumflex, 60% stenosis of the side branch in the first marginal, 95% stenosis of the mid to distal circumflex which was initially 100% occluded but opened up somewhat with injections, 70%  stenosis of the mid LAD lesion with a 70% stenosed side branch and second diagonal, and 75% stenosis of the distal LAD.  The estimated left ventricular ejection fraction is 55 to 65% by visual estimate. IV heparin was initiated. We were consulted for possible surgical coronary revascularization.   Discharged Condition: good  Hospital Course: Patient was medically stabilized and on 01/21/2020 taken the operating room where a underwent the below described procedure.  He tolerated it well was taken to the surgical intensive care unit in stable condition.  Post operative hospital course:  The patient is in well postoperatively.  He was extubated without difficulty using standard protocols.  Nitroglycerin was weaned and he was started on oral nitrate.  He does have an expected acute blood loss anemia which is stabilized.  Most recent hemoglobin hematocrit dated 01/25/2020 was 8.1 and 24.3 respectively.  He has been started on iron supplementation.  Function has remained within normal limits.  Most recent BUN/creatinine dated 01/25/2020 is 14 and 0.94 respectively.  Blood sugars have been under good control using standard measures.  He is tolerating gradually increasing activity using standard cardiac rehab modalities.  Incisions are healing well without evidence of infection.  Oxygen has been weaned and he maintains good saturations on room air.  Chest x-ray shows minor atelectasis and he will continue incentive spirometry at home.  He did have some hypertension and was started on additionally on an ACE inhibitor.  He has postoperative volume overload which at time of discharge is minor and he will continue a short course of Lasix and  potassium.  Overall at the time of discharge the patient felt to be quite stable.  Consults: None  Significant Diagnostic Studies: angiography: cardiac cath  Treatments: surgery:  CARDIOTHORACIC SURGERY OPERATIVE NOTE  Date of Procedure:    01/21/2020  Preoperative Diagnosis:       Severe 3-vessel Coronary Artery Disease  Postoperative Diagnosis:    Same  Procedure:        Coronary Artery Bypass Grafting x 4             Left Internal Mammary Artery to Distal Left Anterior Descending Coronary Artery, pedicled right internal mammary artery graft to left posterior Descending Coronary Artery; left radial artery graft to first obtuse Marginal Branch and diagonal Branch Coronary Artery as a sequenced graft Open left radial artery harvesting Bilateral internal mammary artery harvesting Completion graft surveillance with indocyanine green fluorescence imaging   Surgeon:        B.  Lorayne Marek, MD  Assistant:       Jacques Earthly PA-C  Anesthesia:    General  Operative Findings: ? Preserved left ventricular systolic function ? Good quality internal mammary artery conduits; small size ? Good quality radial artery conduit ? Good quality target vessels for grafting    Discharge Exam: Blood pressure (!) 154/79, pulse 77, temperature 98.7 F (37.1 C), temperature source Oral, resp. rate 19, height 5\' 6"  (1.676 m), weight 74.6 kg, SpO2 97 %.   DGeneral appearance: alert, cooperative and no distress Heart: regular rate and rhythm Lungs: clear to auscultation bilaterally Abdomen: benign Extremities: no edema Wound: incis healing well, Left hand N/V intact   disposition: Discharge disposition: 01-Home or Self Care       Discharge Instructions    Amb Referral to Cardiac Rehabilitation   Complete by: As directed    Diagnosis: CABG   CABG X ___: 4   After initial evaluation and assessments completed: Virtual Based Care may be provided alone or in conjunction with Phase 2 Cardiac Rehab based on patient barriers.: Yes   Discharge patient   Complete by: As directed    Discharge disposition: 01-Home or Self Care   Discharge patient date: 01/25/2020     Allergies as of 01/25/2020   No Active Allergies     Medication List    STOP taking these  medications   naproxen sodium 220 MG tablet Commonly known as: ALEVE   predniSONE 5 MG tablet Commonly known as: DELTASONE     TAKE these medications   aspirin 325 MG tablet Take 325 mg by mouth daily.   atorvastatin 40 MG tablet Commonly known as: LIPITOR Take 1 tablet (40 mg total) by mouth daily.   benazepril 10 MG tablet Commonly known as: LOTENSIN Take 1 tablet (10 mg total) by mouth daily.   colchicine 0.6 MG tablet Take 1 tablet (0.6 mg total) by mouth 2 (two) times daily.   ferrous fumarate-b12-vitamic C-folic acid capsule Commonly known as: TRINSICON / FOLTRIN Take 1 capsule by mouth 2 (two) times daily after a meal.   furosemide 40 MG tablet Commonly known as: LASIX Take 1 tablet (40 mg total) by mouth daily.   isosorbide dinitrate 10 MG tablet Commonly known as: ISORDIL Take 1 tablet (10 mg total) by mouth 3 (three) times daily.   metoprolol tartrate 25 MG tablet Commonly known as: LOPRESSOR Take 1.5 tablets (37.5 mg total) by mouth 2 (two) times daily.   potassium chloride SA 20 MEQ tablet Commonly known as: KLOR-CON Take 2 tablets (  40 mEq total) by mouth daily.   traMADol 50 MG tablet Commonly known as: ULTRAM Take 1 tablet (50 mg total) by mouth every 6 (six) hours as needed for moderate pain.       Follow-up Information    [name], MD Follow up.   Specialty: Cardiothoracic Surgery Contact information: [address] [place] [address] [phone]        Erlinda Hong, MD. Call.   Specialty: General Practice Why: for a follow up regarding further surveillance of HGA1C 6.1 Contact information: [address] [place] [address] [phone]        Laurann Montana, PA-C Follow up on 02/05/2020.   Specialties: Cardiology, Radiology Why: Please arrive 15 minutes early for your 11:15am post-hospital cardiology follow-up appointment Contact information: [address] [place] [address] [phone]              The patient has been discharged on:   1.Beta Blocker:  Yes [ y  ]                              No   [   ]                              If No, reason:  2.Ace Inhibitor/ARB: Yes [  y ]                                     No  [    ]                                     If No, reason:  3.Statin:   Yes [  y ]                  No  [   ]                  If No, reason:  4.Ecasa:  Yes  [ y                  No   [   ]                  If No, reason:  Signed: Rowe Clack PA-C 01/26/2020, 8:51 AM

## 2020-01-22 NOTE — Plan of Care (Signed)
  Problem: Education: Goal: Understanding of cardiac disease, CV risk reduction, and recovery process will improve Outcome: Progressing Goal: Individualized Educational Video(s) Outcome: Progressing   Problem: Activity: Goal: Ability to tolerate increased activity will improve Outcome: Progressing   Problem: Cardiac: Goal: Ability to achieve and maintain adequate cardiovascular perfusion will improve Outcome: Progressing   Problem: Health Behavior/Discharge Planning: Goal: Ability to safely manage health-related needs after discharge will improve Outcome: Progressing   Problem: Education: Goal: Understanding of CV disease, CV risk reduction, and recovery process will improve Outcome: Progressing Goal: Individualized Educational Video(s) Outcome: Progressing   Problem: Activity: Goal: Ability to return to baseline activity level will improve Outcome: Progressing   Problem: Cardiovascular: Goal: Ability to achieve and maintain adequate cardiovascular perfusion will improve Outcome: Progressing Goal: Vascular access site(s) Level 0-1 will be maintained Outcome: Progressing   Problem: Health Behavior/Discharge Planning: Goal: Ability to safely manage health-related needs after discharge will improve Outcome: Progressing   

## 2020-01-22 NOTE — Telephone Encounter (Signed)
New Message   Patient has a TOC appt with Ronie Spies on 02/05/20

## 2020-01-22 NOTE — Progress Notes (Addendum)
[facility] DAILY ICU PROGRESS NOTE                   [address]            [place] [address]          [phone]   1 Day Post-Op Procedure(s) (LRB): CORONARY ARTERY BYPASS GRAFTING (CABG) TIMES 4 USING LIMA TO LAD; RIMA TO PDA; RADIAL HARVEST TO DIAG AND RAMUS. (N/A) RADIAL ARTERY HARVEST (Left) TRANSESOPHAGEAL ECHOCARDIOGRAM (TEE) (N/A) INDOCYANINE GREEN FLUORESCENCE IMAGING (ICG) (N/A)  Total Length of Stay:  LOS: 3 days   Subjective: Generally uncomfortable and agitated , repositioned in the bed and feels a bit better  Objective: Vital signs in last 24 hours: Temp:  [95.4 F (35.2 C)-100.6 F (38.1 C)] 98.8 F (37.1 C) (09/03 1000) Pulse Rate:  [44-101] 81 (09/03 1000) Cardiac Rhythm: (P) Normal sinus rhythm (09/03 0800) Resp:  [0-41] 18 (09/03 1000) BP: (111-167)/(62-84) 119/74 (09/03 1000) SpO2:  [87 %-100 %] 100 % (09/03 1000) Arterial Line BP: (75-216)/(45-122) 129/56 (09/03 1000) FiO2 (%):  [36 %-50 %] 36 % (09/02 2157) Weight:  [78.3 kg] 78.3 kg (09/03 0500)  Filed Weights   01/20/20 0403 01/21/20 0333 01/22/20 0500  Weight: 71.3 kg 71.3 kg 78.3 kg    Weight change: 7.04 kg   Hemodynamic parameters for last 24 hours: PAP: (22-92)/(7-55) 28/13 CVP:  [11 mmHg-13 mmHg] 13 mmHg CO:  [2.1 L/min-4.4 L/min] 4.4 L/min CI:  [1.2 L/min/m2-2.4 L/min/m2] 2.4 L/min/m2  Intake/Output from previous day: 09/02 0701 - 09/03 0700 In: 5586.3 [I.V.:3992.2; Blood:250; IV Piggyback:1344.1] Out: 4375 [Urine:3515; Chest Tube:860]  Intake/Output this shift: Total I/O In: 126.2 [I.V.:126.2] Out: 110 [Urine:90; Chest Tube:20]  Current Meds: Scheduled Meds:  acetaminophen  1,000 mg Oral Q6H   Or   acetaminophen (TYLENOL) oral liquid 160 mg/5 mL  1,000 mg Per Tube Q6H   aspirin EC  325 mg Oral Daily   Or   aspirin  324 mg Per Tube Daily   bisacodyl  10 mg Oral Daily   Or   bisacodyl  10 mg Rectal Daily   Chlorhexidine Gluconate Cloth  6 each  Topical Q0600   colchicine  0.3 mg Oral BID   docusate sodium  200 mg Oral Daily   insulin aspart  0-24 Units Subcutaneous Q4H   isosorbide dinitrate  10 mg Oral TID   ketorolac  15 mg Intravenous Q6H   mouth rinse  15 mL Mouth Rinse BID   metoprolol tartrate  12.5 mg Oral Q8H   Or   metoprolol tartrate  12.5 mg Per Tube Q8H   mupirocin ointment  1 application Nasal BID   [START ON 01/23/2020] pantoprazole  40 mg Oral Daily   sodium chloride flush  10-40 mL Intracatheter Q12H   sodium chloride flush  3 mL Intravenous Q12H   Continuous Infusions:  sodium chloride 20 mL/hr at 01/22/20 1000   sodium chloride     sodium chloride 10 mL/hr at 01/21/20 1441   cefUROXime (ZINACEF)  IV Stopped (01/22/20 0631)   insulin Stopped (01/21/20 1805)   banana bag IV 1000 mL     lactated ringers     lactated ringers     lactated ringers 20 mL/hr at 01/22/20 1000   nitroGLYCERIN 7 mcg/min (01/22/20 1000)   PRN Meds:.sodium chloride, dextrose, lactated ringers, metoprolol tartrate, morphine injection, ondansetron (ZOFRAN) IV, oxyCODONE, sodium chloride flush, sodium chloride flush, traMADol  General appearance: alert, cooperative and no  distress Neurologic: intact Heart: regular rate and rhythm Lungs: mildly dim in bases Abdomen: mild distesion, non-tender Extremities: no edema Wound: dressings CDI  Lab Results: CBC: Recent Labs    01/21/20 2031 01/21/20 2032 01/21/20 2202 01/22/20 0324  WBC 10.6*  --   --  10.7*  HGB 9.8*   < > 9.2* 9.6*  HCT 29.4*   < > 27.0* 29.4*  PLT 209  --   --  178   < > = values in this interval not displayed.   BMET:  Recent Labs    01/21/20 2031 01/21/20 2032 01/21/20 2202 01/22/20 0324  NA 136   < > 137 135  K 4.8   < > 5.3* 4.6  CL 109  --   --  109  CO2 21*  --   --  19*  GLUCOSE 124*  --   --  128*  BUN 8  --   --  9  CREATININE 1.01  --   --  0.96  CALCIUM 8.2*  --   --  8.3*   < > = values in this interval not  displayed.    CMET: Lab Results  Component Value Date   WBC 10.7 (H) 01/22/2020   HGB 9.6 (L) 01/22/2020   HCT 29.4 (L) 01/22/2020   PLT 178 01/22/2020   GLUCOSE 128 (H) 01/22/2020   CHOL 206 (H) 01/20/2020   TRIG 95 01/20/2020   HDL 62 01/20/2020   LDLCALC 125 (H) 01/20/2020   ALT 21 01/21/2020   AST 17 01/21/2020   NA 135 01/22/2020   K 4.6 01/22/2020   CL 109 01/22/2020   CREATININE 0.96 01/22/2020   BUN 9 01/22/2020   CO2 19 (L) 01/22/2020   TSH 2.698 01/20/2020   INR 1.3 (H) 01/21/2020   HGBA1C 6.1 (H) 01/20/2020      PT/INR:  Recent Labs    01/21/20 1438  LABPROT 15.6*  INR 1.3*   Radiology: DG Chest Port 1 View  Result Date: 01/22/2020 CLINICAL DATA:  Status post CABG EXAM: PORTABLE CHEST 1 VIEW COMPARISON:  01/21/2020 and prior studies FINDINGS: An NG tube and endotracheal tube have been removed. RIGHT IJ Swan-Ganz catheter, mediastinal tube and bilateral thoracostomy tubes again noted. No definite pneumothorax identified on this study. Increased RIGHT basilar atelectasis noted with minimal LEFT basilar atelectasis is present. No large pleural effusions are present. IMPRESSION: Increased RIGHT basilar atelectasis and minimal LEFT basilar atelectasis. No definite pneumothorax. Electronically Signed   By: Harmon Pier M.D.   On: 01/22/2020 07:17   DG Chest Port 1 View  Result Date: 01/21/2020 CLINICAL DATA:  Pneumothorax EXAM: PORTABLE CHEST 1 VIEW COMPARISON:  Radiograph 01/21/2020 FINDINGS: Patient appears to have undergone interval sternotomy and CABG. Endotracheal tube is positioned low within the trachea 1.9 cm from the carina and could be retracted 1-2 cm to the mid trachea. Transesophageal tube tip and side port terminate below the margins of imaging, beyond the GE junction. A right IJ approach Swan-Ganz catheter tip terminates at the level of the main pulmonary artery Mediastinal and bilateral pleural drains are in place. Some increasing patchy opacity seen in the  right lung likely reflecting some atelectasis and/or mild edema. Peripheral right pneumothorax is noted. No visible pleural fluid is evident at this time. No left pneumothorax or effusion. Left lung is largely clear. No other acute or significant osseous or soft tissue abnormalities. Severe degenerative changes with bony remodeling in both shoulders, unchanged from comparison study. IMPRESSION:  1. Endotracheal tube is positioned low within the trachea 1.9 cm from the carina and could be retracted 1-2 cm to the mid trachea. 2. Additional lines and tubes as above. 3. Peripheral right pneumothorax noted. No visible pleural fluid is evident at this time. 4. Increasing opacity in the right lung could reflect atelectasis or edema in the postsurgical setting. Electronically Signed   By: Kreg Shropshire M.D.   On: 01/21/2020 15:04     Assessment/Plan: S/P Procedure(s) (LRB): CORONARY ARTERY BYPASS GRAFTING (CABG) TIMES 4 USING LIMA TO LAD; RIMA TO PDA; RADIAL HARVEST TO DIAG AND RAMUS. (N/A) RADIAL ARTERY HARVEST (Left) TRANSESOPHAGEAL ECHOCARDIOGRAM (TEE) (N/A) INDOCYANINE GREEN FLUORESCENCE IMAGING (ICG) (N/A)  POD#1CABGx4 arterial conduits  1 overall doing well 2 stable hemodynamics on nitro, BP is up at times- wean, Imdur to start, sganz to be d/c'd 3 expected acute blood loss anemia, monitor clinically, CT- 860 cc- keep in place, serosang 4 good spont diuresis thus far, normal renal fxn- cont to observe- may need lasix at some point 5 BS adeq control- no DM , will d/c insulin gtt 6 CXR left>R basilar atx- routine pulm toilet and cardiac rehab 7 colchicine and toradol ordered for pain , inflammation  Rowe Clack PA-C Pager [phone] 01/22/2020 10:23 AM   Pt seen and examined; agree with documentation. Continue early post operatve, routine care. Danni Leabo Z. Vickey Sages, MD [phone]

## 2020-01-22 NOTE — Progress Notes (Signed)
Cardiology note:  Pt stable this am post bypass surgery yesterday Chest wall sore BP stable.  Sinus on telemetry Continue ASA and beta blocker. He is statin intolerant.   Verne Carrow 01/22/2020 8:53 AM

## 2020-01-23 ENCOUNTER — Inpatient Hospital Stay (HOSPITAL_COMMUNITY): Payer: Medicaid Other

## 2020-01-23 LAB — BASIC METABOLIC PANEL
Anion gap: 6 (ref 5–15)
BUN: 14 mg/dL (ref 8–23)
CO2: 23 mmol/L (ref 22–32)
Calcium: 7.6 mg/dL — ABNORMAL LOW (ref 8.9–10.3)
Chloride: 102 mmol/L (ref 98–111)
Creatinine, Ser: 1 mg/dL (ref 0.61–1.24)
GFR calc Af Amer: >60 mL/min (ref >60)
GFR calc non Af Amer: >60 mL/min (ref >60)
Glucose, Bld: 101 mg/dL — ABNORMAL HIGH (ref 70–99)
Potassium: 3.8 mmol/L (ref 3.5–5.1)
Sodium: 131 mmol/L — ABNORMAL LOW (ref 135–145)

## 2020-01-23 LAB — CBC
HCT: 24.7 % — ABNORMAL LOW (ref 39.0–52.0)
Hemoglobin: 8.1 g/dL — ABNORMAL LOW (ref 13.0–17.0)
MCH: 30.5 pg (ref 26.0–34.0)
MCHC: 32.8 g/dL (ref 30.0–36.0)
MCV: 92.9 fL (ref 80.0–100.0)
Platelets: 141 10*3/uL — ABNORMAL LOW (ref 150–400)
RBC: 2.66 MIL/uL — ABNORMAL LOW (ref 4.22–5.81)
RDW: 14.6 % (ref 11.5–15.5)
WBC: 11.8 10*3/uL — ABNORMAL HIGH (ref 4.0–10.5)
nRBC: 0 % (ref 0.0–0.2)

## 2020-01-23 LAB — GLUCOSE, CAPILLARY
Glucose-Capillary: 93 mg/dL (ref 70–99)
Glucose-Capillary: 110 mg/dL — ABNORMAL HIGH (ref 70–99)
Glucose-Capillary: 91 mg/dL (ref 70–99)
Glucose-Capillary: 68 mg/dL — ABNORMAL LOW (ref 70–99)
Glucose-Capillary: 118 mg/dL — ABNORMAL HIGH (ref 70–99)
Glucose-Capillary: 125 mg/dL — ABNORMAL HIGH (ref 70–99)

## 2020-01-23 LAB — TYPE AND SCREEN
ABO/RH(D): A POS
Antibody Screen: NEGATIVE
Unit division: 0
Unit division: 0

## 2020-01-23 LAB — BPAM RBC
Blood Product Expiration Date: 202109072359
Blood Product Expiration Date: 202109082359
ISSUE DATE / TIME: 202108191444
ISSUE DATE / TIME: 202108230755
Unit Type and Rh: 6200
Unit Type and Rh: 6200

## 2020-01-23 MED ORDER — FUROSEMIDE 10 MG/ML IJ SOLN
40.0000 mg | Freq: Two times a day (BID) | INTRAMUSCULAR | Status: DC
  Administered 2020-01-23 – 2020-01-24 (×4): 40 mg via INTRAVENOUS
  Filled 2020-01-23 (×4): qty 4

## 2020-01-23 MED ORDER — METOPROLOL TARTRATE 25 MG PO TABS
25.0000 mg | ORAL_TABLET | Freq: Three times a day (TID) | ORAL | Status: DC
  Administered 2020-01-23 – 2020-01-24 (×5): 25 mg via ORAL
  Filled 2020-01-23 (×5): qty 1

## 2020-01-23 MED ORDER — METOPROLOL TARTRATE 25 MG/10 ML ORAL SUSPENSION
25.0000 mg | Freq: Three times a day (TID) | ORAL | Status: DC
  Filled 2020-01-23 (×6): qty 10

## 2020-01-23 MED ORDER — MAGNESIUM OXIDE 400 (241.3 MG) MG PO TABS
400.0000 mg | ORAL_TABLET | Freq: Two times a day (BID) | ORAL | Status: DC
  Administered 2020-01-23 – 2020-01-25 (×5): 400 mg via ORAL
  Filled 2020-01-23 (×5): qty 1

## 2020-01-23 MED ORDER — POTASSIUM CHLORIDE CRYS ER 20 MEQ PO TBCR
20.0000 meq | EXTENDED_RELEASE_TABLET | ORAL | Status: AC
  Administered 2020-01-23 (×3): 20 meq via ORAL
  Filled 2020-01-23 (×3): qty 1

## 2020-01-23 MED ORDER — COLCHICINE 0.6 MG PO TABS
0.6000 mg | ORAL_TABLET | Freq: Two times a day (BID) | ORAL | Status: DC
  Administered 2020-01-23 – 2020-01-25 (×5): 0.6 mg via ORAL
  Filled 2020-01-23 (×5): qty 1

## 2020-01-23 NOTE — Progress Notes (Signed)
Pt received from 2H to 4E15. Oriented to room and call bell. CHG bath complete. CCMD called, VSS. Call bell in reach. Will continue to monitor.  Hazle Nordmann, RN

## 2020-01-23 NOTE — Progress Notes (Signed)
2 Days Post-Op Procedure(s) (LRB): CORONARY ARTERY BYPASS GRAFTING (CABG) TIMES 4 USING LIMA TO LAD; RIMA TO PDA; RADIAL HARVEST TO DIAG AND RAMUS. (N/A) RADIAL ARTERY HARVEST (Left) TRANSESOPHAGEAL ECHOCARDIOGRAM (TEE) (N/A) INDOCYANINE GREEN FLUORESCENCE IMAGING (ICG) (N/A) Subjective: No complaints except minor discomfort near chest tubes  Objective: Vital signs in last 24 hours: Temp:  [98.3 F (36.8 C)-99.1 F (37.3 C)] 98.4 F (36.9 C) (09/04 0400) Pulse Rate:  [73-89] 78 (09/04 0600) Cardiac Rhythm: Normal sinus rhythm (09/04 0400) Resp:  [2-32] 20 (09/04 0600) BP: (100-169)/(61-84) 151/84 (09/04 0600) SpO2:  [95 %-100 %] 98 % (09/04 0600) Arterial Line BP: (126-186)/(53-71) 128/53 (09/03 1700) Weight:  [79.5 kg] 79.5 kg (09/04 0500)  Hemodynamic parameters for last 24 hours: PAP: (28-35)/(11-19) 35/19  Intake/Output from previous day: 09/03 0701 - 09/04 0700 In: 1946.3 [I.V.:1746.3; IV Piggyback:200] Out: 1220 [Urine:770; Chest Tube:450] Intake/Output this shift: No intake/output data recorded.  General appearance: alert and cooperative Neurologic: intact Heart: regular rate and rhythm, S1, S2 normal, no murmur, click, rub or gallop Lungs: clear to auscultation bilaterally Abdomen: soft, non-tender; bowel sounds normal; no masses,  no organomegaly Extremities: edema mild Wound: dressed, dry  Lab Results: Recent Labs    01/22/20 1745 01/23/20 0440  WBC 13.5* 11.8*  HGB 9.3* 8.1*  HCT 27.5* 24.7*  PLT 179 141*   BMET:  Recent Labs    01/22/20 1745 01/23/20 0440  NA 134* 131*  K 4.2 3.8  CL 105 102  CO2 20* 23  GLUCOSE 143* 101*  BUN 12 14  CREATININE 0.95 1.00  CALCIUM 8.2* 7.6*    PT/INR:  Recent Labs    01/21/20 1438  LABPROT 15.6*  INR 1.3*   ABG    Component Value Date/Time   PHART 7.362 01/21/2020 2202   HCO3 22.7 01/21/2020 2202   TCO2 24 01/21/2020 2202   ACIDBASEDEF 2.0 01/21/2020 2202   O2SAT 99.0 01/21/2020 2202   CBG  (last 3)  Recent Labs    01/23/20 0040 01/23/20 0416 01/23/20 0644  GLUCAP 91 93 110*    Assessment/Plan: S/P Procedure(s) (LRB): CORONARY ARTERY BYPASS GRAFTING (CABG) TIMES 4 USING LIMA TO LAD; RIMA TO PDA; RADIAL HARVEST TO DIAG AND RAMUS. (N/A) RADIAL ARTERY HARVEST (Left) TRANSESOPHAGEAL ECHOCARDIOGRAM (TEE) (N/A) INDOCYANINE GREEN FLUORESCENCE IMAGING (ICG) (N/A) Mobilize Diuresis remove chest tubes;   May be ok to transfer later today   LOS: 4 days    Todd Gonzalez 01/23/2020

## 2020-01-24 LAB — GLUCOSE, CAPILLARY
Glucose-Capillary: 109 mg/dL — ABNORMAL HIGH (ref 70–99)
Glucose-Capillary: 112 mg/dL — ABNORMAL HIGH (ref 70–99)
Glucose-Capillary: 122 mg/dL — ABNORMAL HIGH (ref 70–99)
Glucose-Capillary: 178 mg/dL — ABNORMAL HIGH (ref 70–99)
Glucose-Capillary: 87 mg/dL (ref 70–99)
Glucose-Capillary: 93 mg/dL (ref 70–99)
Glucose-Capillary: 99 mg/dL (ref 70–99)

## 2020-01-24 LAB — ECHO INTRAOPERATIVE TEE
Height: 66 in
Weight: 2513.6 oz

## 2020-01-24 MED ORDER — INSULIN ASPART 100 UNIT/ML ~~LOC~~ SOLN
0.0000 [IU] | Freq: Three times a day (TID) | SUBCUTANEOUS | Status: DC
  Administered 2020-01-24: 4 [IU] via SUBCUTANEOUS

## 2020-01-24 NOTE — Anesthesia Postprocedure Evaluation (Signed)
Anesthesia Post Note  Patient: Todd Gonzalez  Procedure(Gonzalez) Performed: CORONARY ARTERY BYPASS GRAFTING (CABG) TIMES 4 USING LIMA TO LAD; RIMA TO PDA; RADIAL HARVEST TO DIAG AND RAMUS. (N/A Chest) RADIAL ARTERY HARVEST (Left Arm Lower) TRANSESOPHAGEAL ECHOCARDIOGRAM (TEE) (N/A ) INDOCYANINE GREEN FLUORESCENCE IMAGING (ICG) (N/A )     Patient location during evaluation: SICU Anesthesia Type: General Level of consciousness: sedated Pain management: pain level controlled Vital Signs Assessment: post-procedure vital signs reviewed and stable Respiratory status: patient remains intubated per anesthesia plan Cardiovascular status: stable Postop Assessment: no apparent nausea or vomiting Anesthetic complications: no   No complications documented.  Last Vitals:  Vitals:   01/24/20 0017 01/24/20 0429  BP: 117/73 124/67  Pulse: 80 73  Resp: 20 20  Temp: 36.9 C 36.7 C  SpO2: 95% 100%    Last Pain:  Vitals:   01/24/20 0429  TempSrc: Oral  PainSc:                  Todd Gonzalez

## 2020-01-24 NOTE — Progress Notes (Signed)
[address]       [place] [address]             [phone]      3 Days Post-Op Procedure(s) (LRB): CORONARY ARTERY BYPASS GRAFTING (CABG) TIMES 4 USING LIMA TO LAD; RIMA TO PDA; RADIAL HARVEST TO DIAG AND RAMUS. (N/A) RADIAL ARTERY HARVEST (Left) TRANSESOPHAGEAL ECHOCARDIOGRAM (TEE) (N/A) INDOCYANINE GREEN FLUORESCENCE IMAGING (ICG) (N/A) Subjective: Feels well, no new c/o  Objective: Vital signs in last 24 hours: Temp:  [98 F (36.7 C)-98.7 F (37.1 C)] 98 F (36.7 C) (09/05 0429) Pulse Rate:  [71-80] 73 (09/05 0429) Cardiac Rhythm: Normal sinus rhythm;Bundle branch block (09/04 1929) Resp:  [0-29] 20 (09/05 0429) BP: (116-165)/(64-110) 124/67 (09/05 0429) SpO2:  [95 %-100 %] 100 % (09/05 0429) Weight:  [78 kg] 78 kg (09/05 0600)  Hemodynamic parameters for last 24 hours:    Intake/Output from previous day: 09/04 0701 - 09/05 0700 In: 96.3 [I.V.:96.3] Out: 1391 [Urine:1270; Stool:1; Chest Tube:120] Intake/Output this shift: No intake/output data recorded.  General appearance: alert, cooperative and no distress Heart: regular rate and rhythm Lungs: min dim in bases Abdomen: benign Extremities: no edema Wound: incis healing well  Lab Results: Recent Labs    01/22/20 1745 01/23/20 0440  WBC 13.5* 11.8*  HGB 9.3* 8.1*  HCT 27.5* 24.7*  PLT 179 141*   BMET:  Recent Labs    01/22/20 1745 01/23/20 0440  NA 134* 131*  K 4.2 3.8  CL 105 102  CO2 20* 23  GLUCOSE 143* 101*  BUN 12 14  CREATININE 0.95 1.00  CALCIUM 8.2* 7.6*    PT/INR:  Recent Labs    01/21/20 1438  LABPROT 15.6*  INR 1.3*   ABG    Component Value Date/Time   PHART 7.362 01/21/2020 2202   HCO3 22.7 01/21/2020 2202   TCO2 24 01/21/2020 2202   ACIDBASEDEF 2.0 01/21/2020 2202   O2SAT 99.0 01/21/2020 2202   CBG (last 3)  Recent Labs    01/23/20 2036 01/24/20 0014 01/24/20 0427  GLUCAP 125* 93 87    Meds Scheduled Meds: . acetaminophen  1,000  mg Oral Q6H   Or  . acetaminophen (TYLENOL) oral liquid 160 mg/5 mL  1,000 mg Per Tube Q6H  . aspirin EC  325 mg Oral Daily   Or  . aspirin  324 mg Per Tube Daily  . bisacodyl  10 mg Oral Daily   Or  . bisacodyl  10 mg Rectal Daily  . Chlorhexidine Gluconate Cloth  6 each Topical Q0600  . colchicine  0.6 mg Oral BID  . docusate sodium  200 mg Oral Daily  . furosemide  40 mg Intravenous BID  . insulin aspart  0-24 Units Subcutaneous Q4H  . isosorbide dinitrate  10 mg Oral TID  . magnesium oxide  400 mg Oral BID  . mouth rinse  15 mL Mouth Rinse BID  . metoprolol tartrate  25 mg Oral Q8H   Or  . metoprolol tartrate  25 mg Per Tube Q8H  . mupirocin ointment  1 application Nasal BID  . pantoprazole  40 mg Oral Daily  . sodium chloride flush  10-40 mL Intracatheter Q12H  . sodium chloride flush  3 mL Intravenous Q12H   Continuous Infusions: . sodium chloride Stopped (01/22/20 2325)  . lactated ringers     PRN Meds:.sodium chloride, dextrose, lactated ringers, metoprolol tartrate, morphine injection, ondansetron (ZOFRAN) IV, oxyCODONE, sodium chloride flush,  sodium chloride flush, traMADol  Xrays DG Chest Port 1 View  Result Date: 01/23/2020 CLINICAL DATA:  Status post cardiac surgery. EXAM: PORTABLE CHEST 1 VIEW COMPARISON:  January 22, 2020. FINDINGS: Stable cardiomegaly. Bilateral chest tubes are noted without pneumothorax. Swan-Ganz catheter has been removed. Improved right basilar subsegmental atelectasis is noted. Bony thorax is unremarkable. IMPRESSION: Bilateral chest tubes are noted without pneumothorax. Improved right basilar subsegmental atelectasis. Electronically Signed   By: Lupita Raider M.D.   On: 01/23/2020 08:45    Assessment/Plan: S/P Procedure(s) (LRB): CORONARY ARTERY BYPASS GRAFTING (CABG) TIMES 4 USING LIMA TO LAD; RIMA TO PDA; RADIAL HARVEST TO DIAG AND RAMUS. (N/A) RADIAL ARTERY HARVEST (Left) TRANSESOPHAGEAL ECHOCARDIOGRAM (TEE) (N/A) INDOCYANINE GREEN  FLUORESCENCE IMAGING (ICG) (N/A) POD#3  1 doing well 2 BP up at times, should tol ACE-I 3 sinus rhythm 4 sats good on RA 5 no new labs- will repeat in am  6 BS controlled, A1C 6.1- diet control 7 d/c epw's 8 routine pulm toilet/rehab 9 poss home in am  LOS: 5 days    Rowe Clack PA-C [facility] [phone] 01/24/2020

## 2020-01-24 NOTE — Progress Notes (Signed)
Mobility Specialist - Progress Note   01/24/20 1040  Mobility  Activity Ambulated in hall  Level of Assistance Minimal assist, patient does 75% or more  Assistive Device Front wheel walker  Distance Ambulated (ft) 260 ft (Intervals: 130 ft x 2)  Mobility Response Tolerated well  Mobility performed by Mobility specialist  $Mobility charge 1 Mobility    Pre-mobility: 83 HR, 99% SpO2 Post-mobility: 90 HR, 100% SpO2  Pt requiring min assist for set up in bed in order to adhere to sternal precautions, once set up he was able to ambulate w/ supervision only. He required one standing rest break due to feeling fatigued.   Mamie Levers Mobility Specialist Mobility Specialist Phone: [phone]

## 2020-01-25 ENCOUNTER — Inpatient Hospital Stay (HOSPITAL_COMMUNITY): Payer: Medicaid Other

## 2020-01-25 LAB — CBC
HCT: 24.3 % — ABNORMAL LOW (ref 39.0–52.0)
Hemoglobin: 8.1 g/dL — ABNORMAL LOW (ref 13.0–17.0)
MCH: 30.5 pg (ref 26.0–34.0)
MCHC: 33.3 g/dL (ref 30.0–36.0)
MCV: 91.4 fL (ref 80.0–100.0)
Platelets: 214 10*3/uL (ref 150–400)
RBC: 2.66 MIL/uL — ABNORMAL LOW (ref 4.22–5.81)
RDW: 14.6 % (ref 11.5–15.5)
WBC: 10.1 10*3/uL (ref 4.0–10.5)
nRBC: 0.9 % — ABNORMAL HIGH (ref 0.0–0.2)

## 2020-01-25 LAB — GLUCOSE, CAPILLARY
Glucose-Capillary: 93 mg/dL (ref 70–99)
Glucose-Capillary: 94 mg/dL (ref 70–99)

## 2020-01-25 LAB — BASIC METABOLIC PANEL
Anion gap: 7 (ref 5–15)
BUN: 14 mg/dL (ref 8–23)
CO2: 26 mmol/L (ref 22–32)
Calcium: 7.9 mg/dL — ABNORMAL LOW (ref 8.9–10.3)
Chloride: 103 mmol/L (ref 98–111)
Creatinine, Ser: 0.94 mg/dL (ref 0.61–1.24)
GFR calc Af Amer: >60 mL/min (ref >60)
GFR calc non Af Amer: >60 mL/min (ref >60)
Glucose, Bld: 78 mg/dL (ref 70–99)
Potassium: 3.4 mmol/L — ABNORMAL LOW (ref 3.5–5.1)
Sodium: 136 mmol/L (ref 135–145)

## 2020-01-25 MED ORDER — FUROSEMIDE 40 MG PO TABS
40.0000 mg | ORAL_TABLET | Freq: Every day | ORAL | 0 refills | Status: DC
Start: 2020-01-25 — End: 2020-02-05

## 2020-01-25 MED ORDER — METOPROLOL TARTRATE 25 MG PO TABS
37.5000 mg | ORAL_TABLET | Freq: Two times a day (BID) | ORAL | 1 refills | Status: DC
Start: 2020-01-25 — End: 2020-03-24

## 2020-01-25 MED ORDER — COLCHICINE 0.6 MG PO TABS
0.6000 mg | ORAL_TABLET | Freq: Two times a day (BID) | ORAL | 0 refills | Status: DC
Start: 2020-01-25 — End: 2020-02-05

## 2020-01-25 MED ORDER — POTASSIUM CHLORIDE CRYS ER 20 MEQ PO TBCR
20.0000 meq | EXTENDED_RELEASE_TABLET | Freq: Once | ORAL | Status: AC
  Administered 2020-01-25: 20 meq via ORAL
  Filled 2020-01-25: qty 1

## 2020-01-25 MED ORDER — FE FUMARATE-B12-VIT C-FA-IFC PO CAPS
1.0000 | ORAL_CAPSULE | Freq: Two times a day (BID) | ORAL | Status: DC
  Administered 2020-01-25: 1 via ORAL
  Filled 2020-01-25: qty 1

## 2020-01-25 MED ORDER — BENAZEPRIL HCL 5 MG PO TABS
10.0000 mg | ORAL_TABLET | Freq: Every day | ORAL | Status: DC
  Administered 2020-01-25: 10 mg via ORAL
  Filled 2020-01-25: qty 2

## 2020-01-25 MED ORDER — BENAZEPRIL HCL 10 MG PO TABS
10.0000 mg | ORAL_TABLET | Freq: Every day | ORAL | 1 refills | Status: DC
Start: 2020-01-25 — End: 2020-03-24

## 2020-01-25 MED ORDER — FUROSEMIDE 40 MG PO TABS
40.0000 mg | ORAL_TABLET | Freq: Every day | ORAL | Status: DC
  Administered 2020-01-25: 40 mg via ORAL
  Filled 2020-01-25: qty 1

## 2020-01-25 MED ORDER — ATORVASTATIN CALCIUM 40 MG PO TABS
40.0000 mg | ORAL_TABLET | Freq: Every day | ORAL | Status: DC
  Administered 2020-01-25: 40 mg via ORAL
  Filled 2020-01-25: qty 1

## 2020-01-25 MED ORDER — TRAMADOL HCL 50 MG PO TABS
50.0000 mg | ORAL_TABLET | Freq: Four times a day (QID) | ORAL | 0 refills | Status: DC | PRN
Start: 2020-01-25 — End: 2020-03-18

## 2020-01-25 MED ORDER — ISOSORBIDE DINITRATE 10 MG PO TABS
10.0000 mg | ORAL_TABLET | Freq: Three times a day (TID) | ORAL | 0 refills | Status: DC
Start: 2020-01-25 — End: 2020-03-18

## 2020-01-25 MED ORDER — FE FUMARATE-B12-VIT C-FA-IFC PO CAPS: 1.0000 | ORAL_CAPSULE | Freq: Two times a day (BID) | ORAL | 1 refills | Status: DC

## 2020-01-25 MED ORDER — ATORVASTATIN CALCIUM 40 MG PO TABS
40.0000 mg | ORAL_TABLET | Freq: Every day | ORAL | 1 refills | Status: DC
Start: 2020-01-25 — End: 2020-03-24

## 2020-01-25 MED ORDER — POTASSIUM CHLORIDE CRYS ER 20 MEQ PO TBCR
40.0000 meq | EXTENDED_RELEASE_TABLET | Freq: Every day | ORAL | 0 refills | Status: DC
Start: 2020-01-25 — End: 2020-02-05

## 2020-01-25 NOTE — Care Management (Signed)
Case management noted that patient was uninsured, looked at AVS and noted moderate amount of new medications.  Case management attempted to discuss cost of medication with patient and if they were able to afford medication, however, patient discharged prior to this discussion.  Called RN Cherise and she said that pharmacy had come up and discussed medication and cost with patient.  Case management signing off.

## 2020-01-25 NOTE — Progress Notes (Signed)
CARDIOLOGY RECOMMENDATIONS:  Discharge is anticipated in the next 48 hours. Recommendations for medications and follow up:  Discharge Medications: Continue medications as they are currently listed in the [facility]. Exceptions to the above:  I discontinued IV lasix and switched to 40 mg po daily  Patient denies intolerance to lipitor- states his doctor told him it was too expensive. Will start lipitor 40 mg daily.  Follow Up: The patient's Primary Cardiologist is Verne Carrow, MD     Follow up in the office in 2 week(s).  Signed,  Dorreen Valiente [place], MD  7:40 AM 01/25/2020  [facility]

## 2020-01-25 NOTE — Progress Notes (Signed)
D/c instructions with reviewed with pt and son. Pt educated on importance of site care and monitoring incision. Pt with CT sutures and staples kept per PA. Pt denies further questions or concerns.   Reynold Bowen, RN BSN 01/25/2020 11:18 AM

## 2020-01-25 NOTE — Plan of Care (Signed)
°  Problem: Education: °Goal: Understanding of cardiac disease, CV risk reduction, and recovery process will improve °Outcome: Progressing °Goal: Individualized Educational Video(s) °Outcome: Progressing °  °

## 2020-01-25 NOTE — Progress Notes (Signed)
[address]       [place] [address]             [phone]      4 Days Post-Op Procedure(s) (LRB): CORONARY ARTERY BYPASS GRAFTING (CABG) TIMES 4 USING LIMA TO LAD; RIMA TO PDA; RADIAL HARVEST TO DIAG AND RAMUS. (N/A) RADIAL ARTERY HARVEST (Left) TRANSESOPHAGEAL ECHOCARDIOGRAM (TEE) (N/A) INDOCYANINE GREEN FLUORESCENCE IMAGING (ICG) (N/A) Subjective: Looks and feels well  Objective: Vital signs in last 24 hours: Temp:  [98 F (36.7 C)-98.7 F (37.1 C)] 98.7 F (37.1 C) (09/06 0755) Pulse Rate:  [77-80] 77 (09/06 0550) Cardiac Rhythm: Normal sinus rhythm;Bundle branch block (09/05 1941) Resp:  [18-20] 19 (09/06 0550) BP: (115-154)/(63-79) 154/79 (09/06 0550) SpO2:  [94 %-99 %] 97 % (09/06 0550) Weight:  [74.6 kg] 74.6 kg (09/06 0549)  Hemodynamic parameters for last 24 hours:    Intake/Output from previous day: No intake/output data recorded. Intake/Output this shift: No intake/output data recorded.  General appearance: alert, cooperative and no distress Heart: regular rate and rhythm Lungs: clear to auscultation bilaterally Abdomen: benign Extremities: no edema Wound: incis healing well, Left hand N/V intact  Lab Results: Recent Labs    01/23/20 0440 01/25/20 0227  WBC 11.8* 10.1  HGB 8.1* 8.1*  HCT 24.7* 24.3*  PLT 141* 214   BMET:  Recent Labs    01/23/20 0440 01/25/20 0227  NA 131* 136  K 3.8 3.4*  CL 102 103  CO2 23 26  GLUCOSE 101* 78  BUN 14 14  CREATININE 1.00 0.94  CALCIUM 7.6* 7.9*    PT/INR: No results for input(s): LABPROT, INR in the last 72 hours. ABG    Component Value Date/Time   PHART 7.362 01/21/2020 2202   HCO3 22.7 01/21/2020 2202   TCO2 24 01/21/2020 2202   ACIDBASEDEF 2.0 01/21/2020 2202   O2SAT 99.0 01/21/2020 2202   CBG (last 3)  Recent Labs    01/24/20 2009 01/25/20 0642 01/25/20 0755  GLUCAP 178* 93 94    Meds Scheduled Meds: . acetaminophen  1,000 mg Oral Q6H   Or  .  acetaminophen (TYLENOL) oral liquid 160 mg/5 mL  1,000 mg Per Tube Q6H  . aspirin EC  325 mg Oral Daily   Or  . aspirin  324 mg Per Tube Daily  . atorvastatin  40 mg Oral Daily  . bisacodyl  10 mg Oral Daily   Or  . bisacodyl  10 mg Rectal Daily  . Chlorhexidine Gluconate Cloth  6 each Topical Q0600  . colchicine  0.6 mg Oral BID  . docusate sodium  200 mg Oral Daily  . furosemide  40 mg Oral Daily  . insulin aspart  0-24 Units Subcutaneous TID PC & HS  . isosorbide dinitrate  10 mg Oral TID  . magnesium oxide  400 mg Oral BID  . mouth rinse  15 mL Mouth Rinse BID  . metoprolol tartrate  25 mg Oral Q8H   Or  . metoprolol tartrate  25 mg Per Tube Q8H  . mupirocin ointment  1 application Nasal BID  . pantoprazole  40 mg Oral Daily  . potassium chloride  20 mEq Oral Once  . sodium chloride flush  10-40 mL Intracatheter Q12H  . sodium chloride flush  3 mL Intravenous Q12H   Continuous Infusions: . sodium chloride Stopped (01/22/20 2325)  . lactated ringers     PRN Meds:.sodium chloride, dextrose, lactated ringers, metoprolol tartrate, morphine  injection, ondansetron (ZOFRAN) IV, oxyCODONE, sodium chloride flush, sodium chloride flush, traMADol  Xrays DG Chest 2 View  Result Date: 01/25/2020 CLINICAL DATA:  History of open heart surgery on 01/21/2020 EXAM: CHEST - 2 VIEW COMPARISON:  01/23/2020 FINDINGS: Chest tubes and mediastinal drains have been removed. Tiny residual right apical pneumothorax. No significant effusion or consolidation. Linear atelectasis in the left mid lung and right lung base. Cardiac enlargement. No vascular congestion or edema. Postoperative change in the mediastinum. Degenerative changes in the spine and shoulders. IMPRESSION: Tiny residual right apical pneumothorax. Linear atelectasis in the left mid lung and right lung base. Electronically Signed   By: Burman Nieves M.D.   On: 01/25/2020 05:52    Assessment/Plan: S/P Procedure(s) (LRB): CORONARY ARTERY  BYPASS GRAFTING (CABG) TIMES 4 USING LIMA TO LAD; RIMA TO PDA; RADIAL HARVEST TO DIAG AND RAMUS. (N/A) RADIAL ARTERY HARVEST (Left) TRANSESOPHAGEAL ECHOCARDIOGRAM (TEE) (N/A) INDOCYANINE GREEN FLUORESCENCE IMAGING (ICG) (N/A)  1 conts to do well 2 afeb, VSS, occas HTN but mostly well controlled, will add low dose ACE-I 3 minor volume overload, cont lasix a few more days 4 replace K+ 5 sinus rhythm with some ectopy, change to lopressor 37.5 BID 6 cont nitrate for radial 7 anemia is stable, at trinsicon 8 renal fxn is normal 9 CXR minor atx 10 stable for d/c to home  LOS: 6 days    Rowe Clack PA-C Pager [phone] 01/25/2020

## 2020-01-26 MED FILL — Albumin, Human Inj 5%: INTRAVENOUS | Qty: 250 | Status: AC

## 2020-01-26 MED FILL — Heparin Sodium (Porcine) Inj 1000 Unit/ML: INTRAMUSCULAR | Qty: 30 | Status: AC

## 2020-01-26 MED FILL — Potassium Chloride Inj 2 mEq/ML: INTRAVENOUS | Qty: 40 | Status: AC

## 2020-01-26 MED FILL — Mannitol IV Soln 20%: INTRAVENOUS | Qty: 500 | Status: AC

## 2020-01-26 MED FILL — Lidocaine HCl Local Soln Prefilled Syringe 100 MG/5ML (2%): INTRAMUSCULAR | Qty: 5 | Status: AC

## 2020-01-26 MED FILL — Electrolyte-R (PH 7.4) Solution: INTRAVENOUS | Qty: 5000 | Status: AC

## 2020-01-26 MED FILL — Sodium Chloride IV Soln 0.9%: INTRAVENOUS | Qty: 2000 | Status: AC

## 2020-01-26 MED FILL — Heparin Sodium (Porcine) Inj 1000 Unit/ML: INTRAMUSCULAR | Qty: 10 | Status: AC

## 2020-01-26 MED FILL — Magnesium Sulfate Inj 50%: INTRAMUSCULAR | Qty: 10 | Status: AC

## 2020-01-26 MED FILL — Sodium Bicarbonate IV Soln 8.4%: INTRAVENOUS | Qty: 50 | Status: AC

## 2020-01-26 NOTE — Telephone Encounter (Signed)
Transition Care Management Follow-up Telephone Call  Date of discharge and from where: 01/25/2020 [facility]   How have you been since you were released from the hospital? one  Any questions or concerns? No  Items Reviewed:  Did the pt receive and understand the discharge instructions provided? Yes   Medications obtained and verified? Yes   Any new allergies since your discharge? No   Dietary orders reviewed? Yes  Do you have support at home? Yes   Functional Questionnaire: (I = Independent and D = Dependent) ADLs: D  Bathing/Dressing- D  Meal Prep- D  Eating- D  Maintaining continence- D  Transferring/Ambulation- D  Managing Meds- D  Follow up appointments reviewed:   PCP Hospital f/u appt confirmed? No  the patient's caregiver was instructed to call PCP and schedule appointment   Specialist Hospital f/u appt confirmed? Yes  Scheduled to see Dr. Vickey Sages on 9/13 @ 0900 and Ronie Spies on 9/17 at 1115.  Are transportation arrangements needed? No   If their condition worsens, is the pt aware to call PCP or go to the Emergency Dept.? Yes  Was the patient provided with contact information for the PCP's office or ED? No - not needed  Was to pt encouraged to call back with questions or concerns? Yes

## 2020-01-29 ENCOUNTER — Other Ambulatory Visit: Payer: Self-pay | Admitting: Cardiothoracic Surgery

## 2020-01-29 DIAGNOSIS — Z951 Presence of aortocoronary bypass graft: Secondary | ICD-10-CM

## 2020-02-01 ENCOUNTER — Ambulatory Visit (INDEPENDENT_AMBULATORY_CARE_PROVIDER_SITE_OTHER): Payer: Self-pay | Admitting: Cardiothoracic Surgery

## 2020-02-01 ENCOUNTER — Ambulatory Visit
Admission: RE | Admit: 2020-02-01 | Discharge: 2020-02-01 | Disposition: A | Discharge status: Home / Self Care | Payer: Self-pay | Source: Ambulatory Visit | Attending: Cardiothoracic Surgery | Admitting: Cardiothoracic Surgery

## 2020-02-01 ENCOUNTER — Other Ambulatory Visit: Payer: Self-pay

## 2020-02-01 VITALS — BP 113/66 | HR 73 | Resp 20 | Ht 66.0 in | Wt 154.0 lb

## 2020-02-01 DIAGNOSIS — Z951 Presence of aortocoronary bypass graft: Secondary | ICD-10-CM

## 2020-02-01 DIAGNOSIS — I2510 Atherosclerotic heart disease of native coronary artery without angina pectoris: Secondary | ICD-10-CM

## 2020-02-01 NOTE — Progress Notes (Signed)
[address]       [place] [address]             [phone]     CARDIOTHORACIC SURGERY OFFICE NOTE  Referring Provider is Kathleene Hazel* Primary Cardiologist is Verne Carrow, MD PCP is Erlinda Hong, MD   HPI:  64 year old man presents for initial outpatient visit status post CABG surgery on 01/21/20.  He did well in the hospital and was discharged a few days afterwards.  He now reports some upper chest discomfort and relative upper extremity immobility.  This appears to be related to pain.  He denies chest pain shortness of breath or dysrhythmias.   Current Outpatient Medications  Medication Sig Dispense Refill   aspirin 325 MG tablet Take 325 mg by mouth daily.       atorvastatin (LIPITOR) 40 MG tablet Take 1 tablet (40 mg total) by mouth daily. 30 tablet 1   benazepril (LOTENSIN) 10 MG tablet Take 1 tablet (10 mg total) by mouth daily. 30 tablet 1   colchicine 0.6 MG tablet Take 1 tablet (0.6 mg total) by mouth 2 (two) times daily. 60 tablet 0   ferrous fumarate-b12-vitamic C-folic acid (TRINSICON / FOLTRIN) capsule Take 1 capsule by mouth 2 (two) times daily after a meal. 60 capsule 1   furosemide (LASIX) 40 MG tablet Take 1 tablet (40 mg total) by mouth daily. 5 tablet 0   isosorbide dinitrate (ISORDIL) 10 MG tablet Take 1 tablet (10 mg total) by mouth 3 (three) times daily. 90 tablet 0   metoprolol tartrate (LOPRESSOR) 25 MG tablet Take 1.5 tablets (37.5 mg total) by mouth 2 (two) times daily. 90 tablet 1   potassium chloride SA (KLOR-CON) 20 MEQ tablet Take 2 tablets (40 mEq total) by mouth daily. 5 tablet 0   traMADol (ULTRAM) 50 MG tablet Take 1 tablet (50 mg total) by mouth every 6 (six) hours as needed for moderate pain. 28 tablet 0   No current facility-administered medications for this visit.      Physical Exam:   BP 113/66    Pulse 73    Resp 20    Ht 5\' 6"  (1.676 m)    Wt 69.9 kg    SpO2 93%    BMI 24.86 kg/m     General:  Well-appearing no acute distress  Chest:   Clear to auscultation bilaterally  CV:   Regular rate and rhythm  Incisions:  Healing well; left upper extremity staples are removed and the wound is Steri-Stripped  Abdomen:  Soft nontender  Extremities:  No edema  Diagnostic Tests:  PA and lateral chest x-ray from today is reviewed demonstrating no significant pleural space disease or parenchymal disease  Impression:  Doing well after CABG surgery  Plan:  Follow-up in 2 weeks. Increased mobility especially of upper extremities to avoid frozen shoulder  I spent in excess of 15 minutes during the conduct of this office consultation and >50% of this time involved direct face-to-face encounter with the patient for counseling and/or coordination of their care.  Level 2                 10 minutes Level 3                 15 minutes Level 4                 25 minutes Level 5  40 minutes  B.  Lorayne Marek, MD 02/01/2020 9:41 AM

## 2020-02-03 ENCOUNTER — Encounter: Payer: Self-pay | Admitting: Physician Assistant

## 2020-02-04 ENCOUNTER — Telehealth (HOSPITAL_COMMUNITY): Payer: Self-pay

## 2020-02-04 NOTE — Telephone Encounter (Signed)
Called patient to see if he is interested in the Cardiac Rehab Program([facility]). Patient expressed interest. Explained scheduling process, patient verbalized understanding. Will contact patient for scheduling once f/u has been completed. 

## 2020-02-05 ENCOUNTER — Ambulatory Visit (INDEPENDENT_AMBULATORY_CARE_PROVIDER_SITE_OTHER): Payer: Self-pay | Admitting: Medical

## 2020-02-05 ENCOUNTER — Other Ambulatory Visit: Payer: Self-pay

## 2020-02-05 ENCOUNTER — Encounter: Payer: Self-pay | Admitting: Medical

## 2020-02-05 VITALS — BP 122/62 | HR 92 | Ht 66.0 in | Wt 152.2 lb

## 2020-02-05 DIAGNOSIS — E785 Hyperlipidemia, unspecified: Secondary | ICD-10-CM

## 2020-02-05 DIAGNOSIS — E876 Hypokalemia: Secondary | ICD-10-CM

## 2020-02-05 DIAGNOSIS — I25709 Atherosclerosis of coronary artery bypass graft(s), unspecified, with unspecified angina pectoris: Secondary | ICD-10-CM

## 2020-02-05 DIAGNOSIS — I10 Essential (primary) hypertension: Secondary | ICD-10-CM

## 2020-02-05 DIAGNOSIS — Z720 Tobacco use: Secondary | ICD-10-CM

## 2020-02-05 DIAGNOSIS — I779 Disorder of arteries and arterioles, unspecified: Secondary | ICD-10-CM

## 2020-02-05 MED ORDER — NITROGLYCERIN 0.4 MG SL SUBL: 0.4000 mg | SUBLINGUAL_TABLET | SUBLINGUAL | 3 refills | Status: DC | PRN

## 2020-02-05 MED ORDER — ASPIRIN EC 81 MG PO TBEC: 81.0000 mg | DELAYED_RELEASE_TABLET | Freq: Every day | ORAL | 3 refills | Status: DC

## 2020-02-05 MED ORDER — CLOPIDOGREL BISULFATE 75 MG PO TABS: 75.0000 mg | ORAL_TABLET | Freq: Every day | ORAL | 3 refills | Status: DC

## 2020-02-05 NOTE — Progress Notes (Signed)
Cardiology Office Note  Date:  02/05/2020   ID:  RANJIT ASHURST, DOB 07-Feb-1956, MRN [id number]  PCP:  Erlinda Hong, MD  Cardiologist:  Dr.McAlhany   _____________  Follow-up for CAD s/p recent CABG  _____________   History of Present Illness: Todd Gonzalez is a 64 y.o. male with pmh of CAD s/p ruptured plaque in Om in 2003, NSTEMI with CABGx4 01/21/20, HTN, HLD, pre-DM (A1C 6.1 01/2020), mild carotid artery disease (1-39% B/L), chronic RBBB, who presents for post-hospital follow-up. The patient was admitted earlier this month for chest pain found to have NSTEMI. Cath showed multivessel disease and he underwent CABG x4 with LIMA-LAD, RIMA-LPDA, L-radial-sequential-OM1-diagonal. Pre-op echo showed LVEF 60-65%, +WMA, G1DD, He was originally thought to be statin intolerant but this was due to cost so he was started on atorvastatin.   Today, he reports overall he is doing well. He saw the surgeon last week and had no issues at that visit. Wounds are healing well. Still has some pleuritic chest pain, but this is improving. He has been taking things slowly at home. His son and a friend have been helping. 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 Discussed diet changes. He is still smoking, 1 pack lasts 4 days. He is taking aspirin 325mg  and denies bleeding issues. He does not have rx for SL nitro. He denies symptoms of palpitations, shortness of breath, orthopnea, PND, lower extremity edema, claudication, dizziness, presyncope, syncope, bleeding, or neurologic sequela. The patient is tolerating medications without difficulties and is otherwise without complaint today.   _____________   Past Medical History:  Diagnosis Date  . CAD (coronary artery disease)    ruptured plaque in OM 2003, NSTEMI/CABG 01/2020  . Headache(784.0)   . HLD (hyperlipidemia)   . HTN (hypertension)   . Mild carotid artery disease (HCC)   . Pre-diabetes   . RBBB (right  bundle branch block)    Past Surgical History:  Procedure Laterality Date  . CORONARY ARTERY BYPASS GRAFT N/A 01/21/2020   Procedure: CORONARY ARTERY BYPASS GRAFTING (CABG) TIMES 4 USING LIMA TO LAD; RIMA TO PDA; RADIAL HARVEST TO DIAG AND RAMUS.;  Surgeon: 03/22/2020, MD;  Location: [facility] OR;  Service: Open Heart Surgery;  Laterality: N/A;  BILATERAL IMA  . LEFT HEART CATH AND CORONARY ANGIOGRAPHY N/A 01/19/2020   Procedure: LEFT HEART CATH AND CORONARY ANGIOGRAPHY;  Surgeon: 01/21/2020, MD;  Location: [facility] INVASIVE CV LAB;  Service: Cardiovascular;  Laterality: N/A;  . RADIAL ARTERY HARVEST Left 01/21/2020   Procedure: RADIAL ARTERY HARVEST;  Surgeon: 03/22/2020, MD;  Location: [facility] OR;  Service: Open Heart Surgery;  Laterality: Left;  . TEE WITHOUT CARDIOVERSION N/A 01/21/2020   Procedure: TRANSESOPHAGEAL ECHOCARDIOGRAM (TEE);  Surgeon: 03/22/2020, MD;  Location[facility] OR;  Service: Open Heart Surgery;  Laterality: N/A;   _____________  Current Outpatient Medications  Medication Sig Dispense Refill  . aspirin 325 MG tablet Take 325 mg by mouth daily.      [facility] atorvastatin (LIPITOR) 40 MG tablet Take 1 tablet (40 mg total) by mouth daily. 30 tablet 1  . benazepril (LOTENSIN) 10 MG tablet Take 1 tablet (10 mg total) by mouth daily. 30 tablet 1  . colchicine 0.6 MG tablet Take 1 tablet (0.6 mg total) by mouth 2 (two) times daily. 60 tablet 0  . ferrous fumarate-b12-vitamic C-folic acid (TRINSICON / FOLTRIN) capsule Take 1 capsule by mouth  2 (two) times daily after a meal. 60 capsule 1  . furosemide (LASIX) 40 MG tablet Take 1 tablet (40 mg total) by mouth daily. 5 tablet 0  . isosorbide dinitrate (ISORDIL) 10 MG tablet Take 1 tablet (10 mg total) by mouth 3 (three) times daily. 90 tablet 0  . metoprolol tartrate (LOPRESSOR) 25 MG tablet Take 1.5 tablets (37.5 mg total) by mouth 2 (two) times daily. 90 tablet 1  . potassium chloride SA (KLOR-CON) 20 MEQ tablet Take 2 tablets (40 mEq  total) by mouth daily. 5 tablet 0  . traMADol (ULTRAM) 50 MG tablet Take 1 tablet (50 mg total) by mouth every 6 (six) hours as needed for moderate pain. 28 tablet 0   No current facility-administered medications for this visit.   _____________   Allergies:   Patient has no active allergies.  _____________   Social History:  The patient  reports that he has been smoking. He has been smoking about 0.25 packs per day. He has never used smokeless tobacco. He reports current alcohol use. He reports that he does not use drugs.  _____________   Family History:  The patient's family history is not on file.  _____________   ROS:  Please see the history of present illness.   Positive for pleuritic chest pain,   All other systems are reviewed and negative.  _____________   PHYSICAL EXAM: VS:  There were no vitals taken for this visit. , BMI There is no height or weight on file to calculate BMI. GEN: Well nourished, well developed, in no acute distress  HEENT: normal  Neck: no JVD, carotid bruits, or masses Cardiac: RRR; no murmurs, rubs, or gallops. No clubbing, cyanosis, edema.  Radials/DP/PT 2+ and equal bilaterally.  Respiratory:  clear to auscultation bilaterally, normal work of breathing GI: soft, nontender, nondistended, + BS MS: no deformity or atrophy  Skin: warm and dry, no rash Neuro:  Strength and sensation are intact Psych: euthymic mood, full affect _____________  EKG:   The ekg ordered today shows NSR, HR 92, RBBB, no significant change  Recent Labs: 01/20/2020: TSH 2.698 01/21/2020: ALT 21 01/22/2020: Magnesium 2.4 01/25/2020: BUN 14; Creatinine, Ser 0.94; Hemoglobin 8.1; Platelets 214; Potassium 3.4; Sodium 136  01/20/2020: Cholesterol 206; HDL 62; LDL Cholesterol 125; Total CHOL/HDL Ratio 3.3; Triglycerides 95; VLDL 19  Estimated Creatinine Clearance: 69.8 mL/min (by C-G formula based on SCr of 0.94 mg/dL).  Wt Readings from Last 3 Encounters:  02/01/20 154 lb (69.9 kg)    01/25/20 164 lb 6.4 oz (74.6 kg)  04/30/18 170 lb (77.1 kg)    _____________   ASSESSMENT AND PLAN:  CAD s/p recent CABG Overall he is recovering well post-surgery. Reports improving pleuritic chest pain. No exertional CP or SOB. Taking aspirin 325mg  since he is post-CABG. This was discussed with Dr. who agreed since patient presented as NSTEMI will plan to decrease Aspirin to 81mg  and add plavix 75mg  daily. Check CBC today. Lifestyle changes discussed. Will send in rx for SL nitro, education given. He is on Isordil for radial artery per CTS. If he is still on this at follow-up in 6 weeks can discontinue.   HTN BP today good. Continue Lopressor 37.5mg  BID, Lotensin 10mg  daily  HLD with goal LDL<70 LDL 125, on atorvastatin. So far no problems. Needs LFTs and FLP at next visit.   Hypokalemia K+ 3.4 at discharge. Check BMET today  Mild carotid artery disease 1-39% B/L Can follow with B/L carotid Clifton James  every 2-3 years  Tobacco use Recommend cessation   Disposition:   FU with Dr. Clifton James or APP in 6 weeks   Signed, Zury Fazzino David Stall, PA-C 02/05/2020 9:26 AM    _____________ [facility] [address] [place] [address]  [phone] (office) [phone] (fax)

## 2020-02-05 NOTE — Addendum Note (Signed)
Addended by: Marianne Sofia on: 02/05/2020 01:35 PM   Modules accepted: Level of Service

## 2020-02-05 NOTE — Patient Instructions (Addendum)
Medication Instructions:  Your physician has recommended you make the following change in your medication:  1.  START Nitroglycerin 0.4 s/l tablet.Marland Kitchen TAKE ONLY AS NEEDED AS DIRECTED  2.  REDUCE the Aspirin to 81 mg daily 3.  START Plavix 75 mg taking 1 daily   *If you need a refill on your cardiac medications before your next appointment, please call your pharmacy*   Lab Work: TODAY:  BMET & CBC  If you have labs (blood work) drawn today and your tests are completely normal, you will receive your results only by: Marland Kitchen MyChart Message (if you have MyChart) OR . A paper copy in the mail If you have any lab test that is abnormal or we need to change your treatment, we will call you to review the results.   Testing/Procedures: None ordered   Follow-Up: At [facility], you and your health needs are our priority.  As part of our continuing mission to provide you with exceptional heart care, we have created designated Provider Care Teams.  These Care Teams include your primary Cardiologist (physician) and Advanced Practice Providers (APPs -  Physician Assistants and Nurse Practitioners) who all work together to provide you with the care you need, when you need it.  We recommend signing up for the patient portal called "MyChart".  Sign up information is provided on this After Visit Summary.  MyChart is used to connect with patients for Virtual Visits (Telemedicine).  Patients are able to view lab/test results, encounter notes, upcoming appointments, etc.  Non-urgent messages can be sent to your provider as well.   To learn more about what you can do with MyChart, go to [email or web address].    Your next appointment:   6 week(s)  03/18/20  The format for your next appointment:   In Person  Provider:   You may see Verne Carrow, MD or one of the following Advanced Practice Providers on your designated Care Team:    Ronie Spies, PA-C  Jacolyn Reedy, PA-C    Other  Instructions

## 2020-02-06 LAB — CBC
Hematocrit: 30 % — ABNORMAL LOW (ref 37.5–51.0)
Hemoglobin: 10 g/dL — ABNORMAL LOW (ref 13.0–17.7)
MCH: 29.4 pg (ref 26.6–33.0)
MCHC: 33.3 g/dL (ref 31.5–35.7)
MCV: 88 fL (ref 79–97)
Platelets: 569 10*3/uL — ABNORMAL HIGH (ref 150–450)
RBC: 3.4 x10E6/uL — ABNORMAL LOW (ref 4.14–5.80)
RDW: 14.1 % (ref 11.6–15.4)
WBC: 9.2 10*3/uL (ref 3.4–10.8)

## 2020-02-06 LAB — BASIC METABOLIC PANEL
BUN/Creatinine Ratio: 11 (ref 10–24)
BUN: 11 mg/dL (ref 8–27)
CO2: 22 mmol/L (ref 20–29)
Calcium: 9.5 mg/dL (ref 8.6–10.2)
Chloride: 100 mmol/L (ref 96–106)
Creatinine, Ser: 0.98 mg/dL (ref 0.76–1.27)
GFR calc Af Amer: 92 mL/min/{1.73_m2} (ref >59)
GFR calc non Af Amer: 80 mL/min/{1.73_m2} (ref >59)
Glucose: 95 mg/dL (ref 65–99)
Potassium: 4.5 mmol/L (ref 3.5–5.2)
Sodium: 138 mmol/L (ref 134–144)

## 2020-02-08 ENCOUNTER — Telehealth: Payer: Self-pay

## 2020-02-08 NOTE — Telephone Encounter (Signed)
-----   Message from Cadence David Stall, PA-C sent at 02/08/2020  1:08 PM EDT ----- Please call and inform patient labs looked good. Blood counts better than previous labs

## 2020-02-08 NOTE — Telephone Encounter (Signed)
The patient has been notified of the result and verbalized understanding.  All questions (if any) were answered. Leanord Hawking, RN 02/08/2020 1:11 PM

## 2020-02-09 ENCOUNTER — Ambulatory Visit: Payer: Self-pay | Admitting: Internal Medicine

## 2020-02-12 ENCOUNTER — Ambulatory Visit: Payer: Self-pay | Admitting: Internal Medicine

## 2020-02-15 ENCOUNTER — Other Ambulatory Visit: Payer: Self-pay

## 2020-02-15 ENCOUNTER — Ambulatory Visit: Payer: Self-pay | Admitting: Cardiothoracic Surgery

## 2020-02-15 VITALS — BP 150/86 | HR 69 | Temp 97.9°F | Resp 20 | Ht 66.0 in | Wt 152.0 lb

## 2020-02-15 DIAGNOSIS — Z951 Presence of aortocoronary bypass graft: Secondary | ICD-10-CM

## 2020-02-15 DIAGNOSIS — I2510 Atherosclerotic heart disease of native coronary artery without angina pectoris: Secondary | ICD-10-CM

## 2020-02-15 MED ORDER — TRAMADOL HCL 50 MG PO TABS: 50.0000 mg | ORAL_TABLET | Freq: Four times a day (QID) | ORAL | 0 refills | Status: AC | PRN

## 2020-02-15 NOTE — Progress Notes (Signed)
Patient returns for his second visit as an outpatient following recent discharge for CABG.  He states that he has some residual sternal discomfort but no angina or shortness of breath  Physical exam: Well-appearing gentleman no acute distress Clear to auscultation bilaterally Regular rate and rhythm Incisions: Well-healed  Imaging: No new studies  Medications: Reviewed; okay to stop Isordil now  Impression: Doing well after CABG  Plan: Follow-up with thoracic surgery as needed Follow-up with cardiology as planned Okay to drive now Prescription given for tramadol No. 40 no refills  Brya Simerly Z. Vickey Sages, MD [phone]

## 2020-02-22 ENCOUNTER — Telehealth (HOSPITAL_COMMUNITY): Payer: Self-pay

## 2020-02-22 NOTE — Telephone Encounter (Signed)
Called and spoke with pt in regards to CR, pt states he is unable to work apps within his phone. Adv pt keep walking at home.  Closed referral

## 2020-03-14 ENCOUNTER — Ambulatory Visit: Payer: Self-pay | Admitting: Internal Medicine

## 2020-03-17 ENCOUNTER — Encounter: Payer: Self-pay | Admitting: Physician Assistant

## 2020-03-17 NOTE — Progress Notes (Addendum)
Cardiology Office Note    Date:  03/18/2020   ID:  Todd Gonzalez, DOB 07/14/1955, MRN [id number]  PCP:  Patient, No Pcp Per  Cardiologist:  Todd Carrow, MD  Electrophysiologist:  None   Chief Complaint: 6 week f/u (CABG 01/2020)  History of Present Illness:   Todd Gonzalez is a 64 y.o. male with history of CAD s/p ruptured plaque in OM in 2003, NSTEMI with CABGx4 on 01/21/20, hypertension, hyperlipidemia, pre-DM (A1c 6.1 in 01/2020), mild carotid artery disease (1-39% bilaterally), chronic right bundle branch block who presents for post-hospital follow-up. He followed with our team remotely then was lost to follow-up. He recently returned to the hospital in August 2021 with chest pain and NSTEMI, found to have multivessel disease and underwent CABGx4 with LIMA-LAD, RIMA-LPDA, L-radial-sequential-OM1-diagonal on 01/21/20. Preop echo had shown normal LVEF 60-65%, + WMA, grade 1 DD. He was originally thought to be statin intolerant but this was previously a cost issue so atorvastatin was initiated at discharge. He saw Todd Gonzalez on 02/05/20 in follow-up and was felt to be doing well. Given NSTEMI, per discussion with primary cardiologist, he was changed from full dose ASA to 81mg  with addition of Plavix.  He is seen back for follow-up alone today doing well.  He denies any cardiac complaints including chest pain, dyspnea, edema, orthopnea, PND or syncope. Tolerating all meds including statin.  His blood pressures running high.  He does not follow it at home so he is not sure what it has been running.  He does report that he did not take any of his blood pressure medicines this morning because he has been fasting in anticipation of blood work today.  He wants to know when he can go back to work.  He does cleaning and mopping.  He saw cardiothoracic surgery last month and was released of their care, to follow-up as needed.   Labwork independently reviewed: 01/2020 K 4.5, Cr 0.98, calcium  7.9, Hgb 10.8, Mg 2.4, albumin 3.0, otherwise normal AST/ALT, TSH wnl, A1C 6.1, LDL 125    Past Medical History:  Diagnosis Date  . CAD (coronary artery disease)    ruptured plaque in OM 2003, NSTEMI/CABG 01/2020  . Headache(784.0)   . HLD (hyperlipidemia)   . HTN (hypertension)   . Mild carotid artery disease (HCC)   . Pre-diabetes   . RBBB (right bundle branch block)     Past Surgical History:  Procedure Laterality Date  . CORONARY ARTERY BYPASS GRAFT N/A 01/21/2020   Procedure: CORONARY ARTERY BYPASS GRAFTING (CABG) TIMES 4 USING LIMA TO LAD; RIMA TO PDA; RADIAL HARVEST TO DIAG AND RAMUS.;  Surgeon: 03/22/2020, MD;  Location: [facility] OR;  Service: Open Heart Surgery;  Laterality: N/A;  BILATERAL IMA  . LEFT HEART CATH AND CORONARY ANGIOGRAPHY N/A 01/19/2020   Procedure: LEFT HEART CATH AND CORONARY ANGIOGRAPHY;  Surgeon: 01/21/2020, MD;  Location: [facility] INVASIVE CV LAB;  Service: Cardiovascular;  Laterality: N/A;  . RADIAL ARTERY HARVEST Left 01/21/2020   Procedure: RADIAL ARTERY HARVEST;  Surgeon: 03/22/2020, MD;  Location: [facility] OR;  Service: Open Heart Surgery;  Laterality: Left;  . TEE WITHOUT CARDIOVERSION N/A 01/21/2020   Procedure: TRANSESOPHAGEAL ECHOCARDIOGRAM (TEE);  Surgeon: 03/22/2020, MD;  Location: [facility] OR;  Service: Open Heart Surgery;  Laterality: N/A;    Current Medications: Current Meds  Medication Sig  . aspirin EC 81 MG tablet Take 1 tablet (81 mg total) by mouth daily.  Swallow whole.  Marland Kitchen atorvastatin (LIPITOR) 40 MG tablet Take 1 tablet (40 mg total) by mouth daily.  . benazepril (LOTENSIN) 10 MG tablet Take 1 tablet (10 mg total) by mouth daily.  . clopidogrel (PLAVIX) 75 MG tablet Take 1 tablet (75 mg total) by mouth daily.  . metoprolol tartrate (LOPRESSOR) 25 MG tablet Take 1.5 tablets (37.5 mg total) by mouth 2 (two) times daily.  . traMADol (ULTRAM) 50 MG tablet Take 1 tablet (50 mg total) by mouth every 6 (six) hours as needed.       Allergies:   Patient has no active allergies.   Social History   Socioeconomic History  . Marital status: Married    Spouse name: Not on file  . Number of children: Not on file  . Years of education: Not on file  . Highest education level: Not on file  Occupational History  . Not on file  Tobacco Use  . Smoking status: Current Some Day Smoker    Packs/day: 0.25  . Smokeless tobacco: Never Used  Vaping Use  . Vaping Use: Never used  Substance and Sexual Activity  . Alcohol use: Yes    Comment: occ  . Drug use: No  . Sexual activity: Not on file  Other Topics Concern  . Not on file  Social History Narrative   Not working; helps out at a [employer].    Lives with friend Todd Gonzalez; has a girlfriend.    Social Determinants of Health   Financial Resource Strain:   . Difficulty of Paying Living Expenses: Not on file  Food Insecurity:   . Worried About Todd Gonzalez in the Last Year: Not on file  . Ran Out of Food in the Last Year: Not on file  Transportation Needs:   . Lack of Transportation (Medical): Not on file  . Lack of Transportation (Non-Medical): Not on file  Physical Activity:   . Days of Exercise per Week: Not on file  . Minutes of Exercise per Session: Not on file  Stress:   . Feeling of Stress : Not on file  Social Connections:   . Frequency of Communication with Friends and Family: Not on file  . Frequency of Social Gatherings with Friends and Family: Not on file  . Attends Religious Services: Not on file  . Active Member of Clubs or Organizations: Not on file  . Attends Banker Meetings: Not on file  . Marital Status: Not on file     Family History:  The patient's Family history is unknown by patient.  ROS:   Please see the history of present illness. All other systems are reviewed and otherwise negative.    EKGs/Labs/Other Studies Reviewed:    Studies reviewed are outlined and summarized above. Reports included below if  pertinent.  LHC 01/19/20   Mid LM to Dist LM lesion is 30% stenosed.   Ost Cx to Mid Cx lesion is 70% stenosed with 60% stenosed side branch in 1st Mrg.   CULPRIT LESION mid Cx to Dist Cx lesion is 95% stenosed -was initially 100% occluded, opened up somewhat with injections.   Mid LAD lesion is 70% stenosed with 70% stenosed side branch in 2nd Diag.   Dist LAD lesion is 75% stenosed.   ------------------------------   The left ventricular systolic function is normal. The left ventricular ejection fraction is 55-65% by visual estimate.   LV end diastolic pressure is normal.    SUMMARY   Severe  multivessel disease:  ? Diffuse ostial proximal LCx disease involving takeoff of 1st Mrg with initial total occlusion prior to small caliber diffusely diseased 2nd Mrg (now 90% ulcerated) -   Follow-on AV groove LCx gives off 3 additional posterior lateral branches distally.  ? Diffuse proximal to mid LAD 65% stenosis followed by normalization and then distal-apical tapering to 70% stenosis.  ? Small nondominant RCA   Normal EF and EDP.      RECOMMENDATIONS   Admit overnight, restart IV heparin   CVTS consultation (images reviewed with Dr. Angelena Form. Would be difficult PCI of the LCx with bifurcation lesion and then extensive LAD lesions.   Continue to titrate [place] [facility]        Glenetta Hew, MD   2D echo 01/20/20  1. Left ventricular ejection fraction, by estimation, is 60 to 65%. The  left ventricle has normal function. The left ventricle demonstrates  regional wall motion abnormalities (see scoring diagram/findings for  description). Left ventricular diastolic  parameters are consistent with Grade I diastolic dysfunction (impaired  relaxation). There is mild hypokinesis of the left ventricular, basal  inferolateral wall.  2. Right ventricular systolic function is normal. The right ventricular  size is normal.  3. The mitral valve is normal  in structure. No evidence of mitral valve  regurgitation. No evidence of mitral stenosis.  4. The aortic valve is normal in structure. Aortic valve regurgitation is  not visualized. No aortic stenosis is present.  5. The inferior vena cava is normal in size with greater than 50%  respiratory variability, suggesting right atrial pressure of 3 mmHg.      EKG:  EKG is not ordered today.  Recent Labs: 01/20/2020: TSH 2.698 01/21/2020: ALT 21 01/22/2020: Magnesium 2.4 02/05/2020: BUN 11; Creatinine, Ser 0.98; Hemoglobin 10.0; Platelets 569; Potassium 4.5; Sodium 138  Recent Lipid Panel    Component Value Date/Time   CHOL 206 (H) 01/20/2020 0737   TRIG 95 01/20/2020 0737   HDL 62 01/20/2020 0737   CHOLHDL 3.3 01/20/2020 0737   VLDL 19 01/20/2020 0737   LDLCALC 125 (H) 01/20/2020 0737    PHYSICAL EXAM:    VS:  BP (!) 144/74 Comment: retake  Pulse 71   Ht $R'5\' 6"'uT$  (1.676 m)   Wt 153 lb 6.4 oz (69.6 kg)   SpO2 99%   BMI 24.76 kg/m   BMI: Body mass index is 24.76 kg/m.  GEN: Well nourished, well developed AAM, in no acute distress HEENT: normocephalic, atraumatic Neck: no JVD, carotid bruits, or masses Cardiac: RRR; no murmurs, rubs, or gallops, no edema  Respiratory:  clear to auscultation bilaterally, normal work of breathing GI: soft, nontender, nondistended, + BS MS: no deformity or atrophy Skin: warm and dry, no rash, incisions c/d/i Neuro:  Alert and Oriented x 3, Strength and sensation are intact, follows commands Psych: euthymic mood, full affect  Wt Readings from Last 3 Encounters:  03/18/20 153 lb 6.4 oz (69.6 kg)  02/15/20 152 lb (68.9 kg)  02/05/20 152 lb 3.2 oz (69 kg)     ASSESSMENT & PLAN:   1. CAD s/p recent CABG -doing well, no recent angina.  He will continue aspirin, statin, beta-blocker.  He was on an oral nitrate for a period of time after bypass for his radial graft which was stopped by Dr. Orvan Seen.  I will inquire from Dr. Angelena Form when he may return to  work.  We will also follow-up CBC today for his postop anemia. It  is unclear whether he has SL NTG rx at home - prescribed last visit but he crossed off on list today. We can have him double check when we call with Dr. Camillia Herter input. Smoking cessation advised. Addendum: Dr. Angelena Form is OK with him returning to work if he feels up to it. We'll call to let him know. 2. Essential HTN -blood pressure is elevated but he did not take his morning medicines today.  It is unclear what it has been running at home. We provided him with a home blood pressure cuff today. Instructions given on following with a log.  I think he would be best suited for an in person visit in approximately a week to go over these readings.  We also discussed contacting our office if the blood pressure runs over 130/80 on a regular basis. 3. Hyperlipidemia goal LDL <70 -we will recheck c-Met and lipid profile today since he is fasting. 4. Mild carotid artery disease -this was discussed last visit with Dr. Angelena Form and the decision was made to pursue a carotid duplex repeated in another 2 to 3 years which can be arranged closer to that time.  Disposition: F/u with APP/pharmD in 1 week for blood pressure follow-up at which time subsequent timeframe for followup can be determined. If BP is OK, would recommend to return in 6 months to see Dr. Angelena Form.  Medication Adjustments/Labs and Tests Ordered: Current medicines are reviewed at length with the patient today.  Concerns regarding medicines are outlined above. Medication changes, Labs and Tests ordered today are summarized above and listed in the Patient Instructions accessible in Encounters.   Signed, Charlie Pitter, PA-C  03/18/2020 11:05 AM    [address], [place], [place]  [address] Phone: [phone]; Fax: [phone]

## 2020-03-18 ENCOUNTER — Ambulatory Visit (INDEPENDENT_AMBULATORY_CARE_PROVIDER_SITE_OTHER): Payer: Self-pay | Admitting: Physician Assistant

## 2020-03-18 ENCOUNTER — Encounter: Payer: Self-pay | Admitting: Physician Assistant

## 2020-03-18 ENCOUNTER — Other Ambulatory Visit: Payer: Self-pay

## 2020-03-18 ENCOUNTER — Telehealth: Payer: Self-pay | Admitting: Physician Assistant

## 2020-03-18 VITALS — BP 144/74 | HR 71 | Ht 66.0 in | Wt 153.4 lb

## 2020-03-18 DIAGNOSIS — E785 Hyperlipidemia, unspecified: Secondary | ICD-10-CM

## 2020-03-18 DIAGNOSIS — Z951 Presence of aortocoronary bypass graft: Secondary | ICD-10-CM

## 2020-03-18 DIAGNOSIS — I2510 Atherosclerotic heart disease of native coronary artery without angina pectoris: Secondary | ICD-10-CM

## 2020-03-18 DIAGNOSIS — I10 Essential (primary) hypertension: Secondary | ICD-10-CM

## 2020-03-18 DIAGNOSIS — I779 Disorder of arteries and arterioles, unspecified: Secondary | ICD-10-CM

## 2020-03-18 LAB — LIPID PANEL
Chol/HDL Ratio: 1.8 ratio (ref 0.0–5.0)
Cholesterol, Total: 136 mg/dL (ref 100–199)
HDL: 76 mg/dL (ref >39)
LDL Chol Calc (NIH): 46 mg/dL (ref 0–99)
Triglycerides: 71 mg/dL (ref 0–149)
VLDL Cholesterol Cal: 14 mg/dL (ref 5–40)

## 2020-03-18 LAB — COMPREHENSIVE METABOLIC PANEL
ALT: 88 IU/L — ABNORMAL HIGH (ref 0–44)
AST: 33 IU/L (ref 0–40)
Albumin/Globulin Ratio: 1.7 (ref 1.2–2.2)
Albumin: 4.2 g/dL (ref 3.8–4.8)
Alkaline Phosphatase: 76 IU/L (ref 44–121)
BUN/Creatinine Ratio: 15 (ref 10–24)
BUN: 13 mg/dL (ref 8–27)
Bilirubin Total: 0.3 mg/dL (ref 0.0–1.2)
CO2: 24 mmol/L (ref 20–29)
Calcium: 9.3 mg/dL (ref 8.6–10.2)
Chloride: 103 mmol/L (ref 96–106)
Creatinine, Ser: 0.85 mg/dL (ref 0.76–1.27)
GFR calc Af Amer: 105 mL/min/{1.73_m2} (ref >59)
GFR calc non Af Amer: 91 mL/min/{1.73_m2} (ref >59)
Globulin, Total: 2.5 g/dL (ref 1.5–4.5)
Glucose: 82 mg/dL (ref 65–99)
Potassium: 4.1 mmol/L (ref 3.5–5.2)
Sodium: 140 mmol/L (ref 134–144)
Total Protein: 6.7 g/dL (ref 6.0–8.5)

## 2020-03-18 LAB — CBC
Hematocrit: 39.6 % (ref 37.5–51.0)
Hemoglobin: 12.8 g/dL — ABNORMAL LOW (ref 13.0–17.7)
MCH: 27.9 pg (ref 26.6–33.0)
MCHC: 32.3 g/dL (ref 31.5–35.7)
MCV: 86 fL (ref 79–97)
Platelets: 285 10*3/uL (ref 150–450)
RBC: 4.59 x10E6/uL (ref 4.14–5.80)
RDW: 14.9 % (ref 11.6–15.4)
WBC: 6.8 10*3/uL (ref 3.4–10.8)

## 2020-03-18 NOTE — Patient Instructions (Addendum)
Medication Instructions:  Your physician recommends that you continue on your current medications as directed. Please refer to the Current Medication list given to you today.  *If you need a refill on your cardiac medications before your next appointment, please call your pharmacy*   Lab Work: TODAY:  CMET, CBC, & LIPID  If you have labs (blood work) drawn today and your tests are completely normal, you will receive your results only by: Marland Kitchen MyChart Message (if you have MyChart) OR . A paper copy in the mail If you have any lab test that is abnormal or we need to change your treatment, we will call you to review the results.   Testing/Procedures: None ordxered   Follow-Up: At [facility], you and your health needs are our priority.  As part of our continuing mission to provide you with exceptional heart care, we have created designated Provider Care Teams.  These Care Teams include your primary Cardiologist (physician) and Advanced Practice Providers (APPs -  Physician Assistants and Nurse Practitioners) who all work together to provide you with the care you need, when you need it.  We recommend signing up for the patient portal called "MyChart".  Sign up information is provided on this After Visit Summary.  MyChart is used to connect with patients for Virtual Visits (Telemedicine).  Patients are able to view lab/test results, encounter notes, upcoming appointments, etc.  Non-urgent messages can be sent to your provider as well.   To learn more about what you can do with MyChart, go to [email or web address].    Your next appointment:   1 week(s)  The format for your next appointment:   In Person  Provider:   APP OR PHARM D FOR BP CHECK    Other Instructions I would recommend using a blood pressure cuff that goes on your arm. The wrist ones can be inaccurate. If possible, try to select one that also reports your heart rate. To check your blood pressure, choose a time at least 3  hours after taking your blood pressure medicines. If you can sample it at different times of the day, that's great - it might give you more information about how your blood pressure fluctuates. Remain seated in a chair for 5 minutes quietly beforehand, then check it. Please keep a log of these blood pressures and bring them to your next visit. Call us before then if you tend to get readings of greater than 130 on the top number or 80 on the bottom number.

## 2020-03-18 NOTE — Telephone Encounter (Signed)
   Please let pt know I discussed with Dr. Clifton James and he may return to work if he is feeling up to it. We also noticed today at the OV that he had crossed off the SL NTG rx. Now that he is at home please find out if he has this at home - it should be an as-needed med to have on hand, does not need to take regularly, just want to make sure he has this in case he needs it. Labs still pending if you want to make one phone call for all. Kyrollos Cordell PA-C

## 2020-03-21 NOTE — Telephone Encounter (Signed)
Call placed to pt regarding phone note below. Left pt a message to call back.

## 2020-03-23 MED ORDER — NITROGLYCERIN 0.4 MG SL SUBL: 0.4000 mg | SUBLINGUAL_TABLET | SUBLINGUAL | 3 refills | Status: DC | PRN

## 2020-03-23 NOTE — Telephone Encounter (Signed)
Call placed to pt regarding phone note below. Pt has been made aware that he can return to work, if he feels up to it. Pt stated he didn't have any NTG at home to take if he needed it.  I sent in prescription to [facility]. Pt verbalized understanding.

## 2020-03-24 ENCOUNTER — Other Ambulatory Visit: Payer: Self-pay

## 2020-03-24 ENCOUNTER — Ambulatory Visit (INDEPENDENT_AMBULATORY_CARE_PROVIDER_SITE_OTHER): Payer: Self-pay | Admitting: Pharmacist

## 2020-03-24 ENCOUNTER — Telehealth: Payer: Self-pay | Admitting: *Deleted

## 2020-03-24 VITALS — BP 138/80 | HR 70

## 2020-03-24 DIAGNOSIS — Z79899 Other long term (current) drug therapy: Secondary | ICD-10-CM

## 2020-03-24 DIAGNOSIS — I10 Essential (primary) hypertension: Secondary | ICD-10-CM

## 2020-03-24 MED ORDER — METOPROLOL TARTRATE 25 MG PO TABS
37.5000 mg | ORAL_TABLET | Freq: Two times a day (BID) | ORAL | 3 refills | Status: DC
Start: 2020-03-24 — End: 2021-05-16

## 2020-03-24 MED ORDER — BENAZEPRIL HCL 20 MG PO TABS
20.0000 mg | ORAL_TABLET | Freq: Every day | ORAL | 3 refills | Status: DC
Start: 2020-03-24 — End: 2020-04-18

## 2020-03-24 MED ORDER — ATORVASTATIN CALCIUM 40 MG PO TABS
40.0000 mg | ORAL_TABLET | Freq: Every day | ORAL | 3 refills | Status: DC
Start: 2020-03-24 — End: 2021-03-30

## 2020-03-24 NOTE — Telephone Encounter (Signed)
-----   Message from Laurann Montana, [place] sent at 03/19/2020  9:01 AM EDT ----- Please let patient know that labs all look good except one of his liver function test is abnormal. Can be lab error, transient or medication related. Would recheck ALT level when he comes back in to see [place]. Otherwise continue plan as discussed. He should be monitoring his BP at home and calling us if he notices numbers generally higher than 130 on top number. Continue plan as discussed otherwise. See phone note too.

## 2020-03-24 NOTE — Patient Instructions (Addendum)
It was nice to meet you today!  Your blood pressure goal is < 130/59mmHg  Increase your benazepril from 10mg  to 20mg  each day. Pick up your new refill for the higher dose and take 1 tablet each day  Continue taking your other medications  Monitor and record your blood pressure. Bring your readings and your blood pressure cuff to your next visit in 1 month. Take your morning medicines before you come to your visit that day

## 2020-03-24 NOTE — Progress Notes (Signed)
Patient ID: Todd Gonzalez                 DOB: [birth year]                      MRN: [id number]     HPI: Todd Gonzalez is a 64 y.o. male patient of Dr Clifton James referred by Ronie Spies, PA to HTN clinic. PMH is significant for CAD s/p ruptured plaque in OM in 2003, NSTEMI with CABG x4v on 01/21/20, HTN, HLD, pre-DM (A1c 6.1 in 01/2020), and 1-39% bilateral carotid artery stenosis. BP was elevated at 144/74 at last visit, however pt had not taken any of his morning medications. He was provided with a BP cuff to monitor his readings at home and was referred to HTN clinic for follow up.   Pt presents today in good spirits. Reports tolerating his medications well. Has 1 tablet left of his benazepril and has run out of his atorvastatin. He has checked his BP a few times at home but did not bring his cuff in with him today. He recalls a reading of 144/85 and another with a systolic BP of 148. He checks his BP mid afternoon. Denies dizziness, recalls 1 headache that resolved after he took his dose of benazepril. Occasionally uses NSAIDs for pain, reports Tylenol doesn't work. Tramadol has helped as well.  Current HTN meds: benazepril 10mg  daily, metoprolol tartrate 37.5mg  BID   BP goal: <130/42mmHg  Family History: Unknown by patient.  Social History: Smokes 1/4 PPD, denies drug use, occasional alcohol use.  Diet: Doesn't add salt to food. Likes boiled plantains and potatoes, corncakes. Drinks water, sometimes fruit juice.  Exercise: Walks 150 yards, to/from bus stop. Stays busy with work.  Wt Readings from Last 3 Encounters:  03/18/20 153 lb 6.4 oz (69.6 kg)  02/15/20 152 lb (68.9 kg)  02/05/20 152 lb 3.2 oz (69 kg)   BP Readings from Last 3 Encounters:  03/18/20 (!) 144/74  02/15/20 (!) 150/86  02/05/20 122/62   Pulse Readings from Last 3 Encounters:  03/18/20 71  02/15/20 69  02/05/20 92    Renal function: Estimated Creatinine Clearance: 77.1 mL/min (by C-G formula based on SCr of  0.85 mg/dL).  Past Medical History:  Diagnosis Date  . CAD (coronary artery disease)    ruptured plaque in OM 2003, NSTEMI/CABG 01/2020  . Headache(784.0)   . HLD (hyperlipidemia)   . HTN (hypertension)   . Mild carotid artery disease (HCC)   . Pre-diabetes   . RBBB (right bundle branch block)     Current Outpatient Medications on File Prior to Visit  Medication Sig Dispense Refill  . aspirin EC 81 MG tablet Take 1 tablet (81 mg total) by mouth daily. Swallow whole. 90 tablet 3  . atorvastatin (LIPITOR) 40 MG tablet Take 1 tablet (40 mg total) by mouth daily. 30 tablet 1  . benazepril (LOTENSIN) 10 MG tablet Take 1 tablet (10 mg total) by mouth daily. 30 tablet 1  . clopidogrel (PLAVIX) 75 MG tablet Take 1 tablet (75 mg total) by mouth daily. 90 tablet 3  . metoprolol tartrate (LOPRESSOR) 25 MG tablet Take 1.5 tablets (37.5 mg total) by mouth 2 (two) times daily. 90 tablet 1  . nitroGLYCERIN (NITROSTAT) 0.4 MG SL tablet Place 1 tablet (0.4 mg total) under the tongue every 5 (five) minutes as needed for chest pain. 90 tablet 3  . traMADol (ULTRAM) 50 MG tablet Take 1 tablet (50  mg total) by mouth every 6 (six) hours as needed. 40 tablet 0   No current facility-administered medications on file prior to visit.    No Active Allergies   Assessment/Plan:  1. Hypertension - BP elevated above goal <130/34mmHg. Will increase benazepril from 10mg  to 20mg  daily. Continue metoprolol 37.5mg  BID. Refills sent in on multiple cardiac meds that pt was running low/out of. Encouraged him to limit NSAID use. Pt will monitor BP at home and bring in log and BP cuff to his next visit in 1 month. Will recheck BMET at that time.  Passion Lavin E. Celisa Schoenberg, PharmD, BCACP, CPP [facility] [address] [place], [address] [place] Phone: [phone]; Fax: [phone] 03/24/2020 2:23 PM

## 2020-03-25 ENCOUNTER — Telehealth: Payer: Self-pay | Admitting: *Deleted

## 2020-03-25 DIAGNOSIS — Z79899 Other long term (current) drug therapy: Secondary | ICD-10-CM

## 2020-03-25 LAB — HEPATIC FUNCTION PANEL
ALT: 50 IU/L — ABNORMAL HIGH (ref 0–44)
AST: 26 IU/L (ref 0–40)
Albumin: 4.1 g/dL (ref 3.8–4.8)
Alkaline Phosphatase: 72 IU/L (ref 44–121)
Bilirubin Total: 0.3 mg/dL (ref 0.0–1.2)
Bilirubin, Direct: 0.14 mg/dL (ref 0.00–0.40)
Total Protein: 7 g/dL (ref 6.0–8.5)

## 2020-03-25 LAB — SPECIMEN STATUS REPORT

## 2020-03-25 NOTE — Telephone Encounter (Signed)
-----   Message from Laurann Montana, [place] sent at 03/25/2020  8:20 AM EDT ----- Please let pt know LFTs improving. Would repeat ALT level only when he returns for BP f/u on 11/29 to see if we can see resolution. Do not fast for this lab check. Thanks.

## 2020-03-25 NOTE — Addendum Note (Signed)
Addended by: Burnetta Sabin on: 03/25/2020 08:45 AM   Modules accepted: Orders

## 2020-04-01 ENCOUNTER — Other Ambulatory Visit: Payer: Self-pay

## 2020-04-01 ENCOUNTER — Encounter (HOSPITAL_COMMUNITY): Payer: Self-pay | Admitting: Emergency Medicine

## 2020-04-01 ENCOUNTER — Emergency Department (HOSPITAL_COMMUNITY)
Admission: EM | Admit: 2020-04-01 | Discharge: 2020-04-01 | Disposition: A | Discharge status: Home / Self Care | Payer: Self-pay | Attending: Emergency Medicine | Admitting: Emergency Medicine

## 2020-04-01 DIAGNOSIS — Z79899 Other long term (current) drug therapy: Secondary | ICD-10-CM | POA: Insufficient documentation

## 2020-04-01 DIAGNOSIS — S29012A Strain of muscle and tendon of back wall of thorax, initial encounter: Secondary | ICD-10-CM | POA: Insufficient documentation

## 2020-04-01 DIAGNOSIS — Z951 Presence of aortocoronary bypass graft: Secondary | ICD-10-CM | POA: Insufficient documentation

## 2020-04-01 DIAGNOSIS — Z7902 Long term (current) use of antithrombotics/antiplatelets: Secondary | ICD-10-CM | POA: Insufficient documentation

## 2020-04-01 DIAGNOSIS — S46811A Strain of other muscles, fascia and tendons at shoulder and upper arm level, right arm, initial encounter: Secondary | ICD-10-CM

## 2020-04-01 DIAGNOSIS — Y999 Unspecified external cause status: Secondary | ICD-10-CM | POA: Insufficient documentation

## 2020-04-01 DIAGNOSIS — F17210 Nicotine dependence, cigarettes, uncomplicated: Secondary | ICD-10-CM | POA: Insufficient documentation

## 2020-04-01 DIAGNOSIS — Y92410 Unspecified street and highway as the place of occurrence of the external cause: Secondary | ICD-10-CM | POA: Insufficient documentation

## 2020-04-01 DIAGNOSIS — I119 Hypertensive heart disease without heart failure: Secondary | ICD-10-CM | POA: Insufficient documentation

## 2020-04-01 DIAGNOSIS — Y9389 Activity, other specified: Secondary | ICD-10-CM | POA: Insufficient documentation

## 2020-04-01 DIAGNOSIS — I2510 Atherosclerotic heart disease of native coronary artery without angina pectoris: Secondary | ICD-10-CM | POA: Insufficient documentation

## 2020-04-01 MED ORDER — METHOCARBAMOL 500 MG PO TABS: 500.0000 mg | ORAL_TABLET | Freq: Every evening | ORAL | 0 refills | Status: DC | PRN

## 2020-04-01 MED ORDER — DICLOFENAC SODIUM 1 % EX GEL: 2.0000 g | Freq: Four times a day (QID) | CUTANEOUS | 0 refills | Status: DC
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
## 2020-04-01 NOTE — Discharge Instructions (Signed)
Continue taking home medications as prescribed. Use Voltaren gel to help with pain. Use Robaxin as needed for muscle stiffness or soreness.  Have caution, this may make you tired or groggy.  Do not drive or operate heavy machinery while taking this medicine.  I recommend you take it at night before you go to bed. You will likely have continued stiffness and soreness over the next several days to weeks.  Follow-up with your primary care doctor as needed for further evaluation. Return to the emergency room if you develop severe headache, pain in the middle of your neck, numbness or difficulty moving her right arm, or any new, worsening, or concerning symptoms.

## 2020-04-18 ENCOUNTER — Ambulatory Visit (INDEPENDENT_AMBULATORY_CARE_PROVIDER_SITE_OTHER): Payer: Self-pay | Admitting: Pharmacist

## 2020-04-18 ENCOUNTER — Telehealth: Payer: Self-pay

## 2020-04-18 ENCOUNTER — Other Ambulatory Visit: Payer: Self-pay

## 2020-04-18 ENCOUNTER — Other Ambulatory Visit: Payer: Self-pay | Admitting: *Deleted

## 2020-04-18 VITALS — BP 138/72 | HR 87

## 2020-04-18 DIAGNOSIS — I10 Essential (primary) hypertension: Secondary | ICD-10-CM

## 2020-04-18 DIAGNOSIS — Z79899 Other long term (current) drug therapy: Secondary | ICD-10-CM

## 2020-04-18 LAB — BASIC METABOLIC PANEL
BUN/Creatinine Ratio: 16 (ref 10–24)
BUN: 15 mg/dL (ref 8–27)
CO2: 22 mmol/L (ref 20–29)
Calcium: 9.7 mg/dL (ref 8.6–10.2)
Chloride: 105 mmol/L (ref 96–106)
Creatinine, Ser: 0.96 mg/dL (ref 0.76–1.27)
GFR calc Af Amer: 95 mL/min/{1.73_m2} (ref >59)
GFR calc non Af Amer: 82 mL/min/{1.73_m2} (ref >59)
Glucose: 113 mg/dL — ABNORMAL HIGH (ref 65–99)
Potassium: 3.7 mmol/L (ref 3.5–5.2)
Sodium: 142 mmol/L (ref 134–144)

## 2020-04-18 LAB — ALT: ALT: 21 IU/L (ref 0–44)

## 2020-04-18 MED ORDER — BENAZEPRIL HCL 40 MG PO TABS: 40.0000 mg | ORAL_TABLET | Freq: Every day | ORAL | 3 refills | Status: DC

## 2020-04-18 NOTE — Telephone Encounter (Addendum)
Patient reported chest pain during visit today. He has needed to use sublingual nitroglycerin 3 times in the past 2 weeks. Dr. Clifton James advised that patient restart isosorbide dinitrate 10 mg TID to help with chest pains. Called to inform patient but no answer, left message.

## 2020-04-18 NOTE — Progress Notes (Signed)
Patient ID: Todd Gonzalez                 DOB: 1955/08/13                      MRN: [id number]     HPI: Todd Gonzalez is a 64 y.o. male patient of Dr Clifton James referred by Ronie Spies, PA to HTN clinic. PMH is significant for CAD s/p ruptured plaque in OM in 2003, NSTEMI with CABG x4v on 01/21/20, HTN, HLD, pre-DM (A1c 6.1 in 01/2020), and 1-39% bilateral carotid artery stenosis. BP was elevated at 144/74 at last visit, however pt had not taken any of his morning medications. He was provided with a BP cuff to monitor his readings at home and was referred to HTN clinic for follow up.   At last visit with PharmD clinic on 03/24/20, patient was tolerating meds well.  He had checked his BP a few times at home but did not bring his cuff in with him. He recalled a reading of 144/85 and another with a systolic BP of 148. He checks his BP mid afternoon. Denied dizziness, recalled 1 headache that resolved after he took his dose of benazepril. Reported occasionally uses NSAIDs for pain, reported Tylenol doesn't work. Tramadol has helped as well. Benazepril was increased to 20 mg daily and patient was encouraged to limit NSAID use.   Patient presents today in good spirits. Patient reports they are tolerating benazepril well. No dizziness or lightheadedness. Patient reports chest pain and needing to use sublingual nitroglycerin a couple days ago and 3 times and in the past 2 weeks. Home BP readings 137-148 systolic and mostly in the 80s diastolic. BP in clinic today 138/72 mmHg. He typically takes BP in the morning and evening. He was unsure of the timing of BP readings in relation to medication administration.  Patient did not bring his BP cuff today and did not take his medications this morning as he was running around a lot this morning. Patient reports smoking 2 cigarettes per day, down from 1/4 PPD and he continues to walk about 20 minutes per day.   Current HTN meds: benazepril 20mg  daily (AM), metoprolol  tartrate 37.5mg  BID   BP goal: <130/48mmHg  Family History: Unknown by patient.  Social History: Smokes 2 cigarettes per day (was 1/4 PPD), denies drug use, occasional alcohol use.  Diet: Doesn't add salt to food. Likes boiled plantains and potatoes, corncakes. Drinks water, sometimes fruit juice.  Exercise: Walks 150 yards, to/from bus stop, walks 20 to 25 minutes per day. Stays busy with work.   Home BP Readings:  147/80  144/80  148/80  137  138   Takes every other day morning and evening, thinks it is before medicines in the AM  Wt Readings from Last 3 Encounters:  03/18/20 153 lb 6.4 oz (69.6 kg)  02/15/20 152 lb (68.9 kg)  02/05/20 152 lb 3.2 oz (69 kg)   BP Readings from Last 3 Encounters:  04/01/20 (!) 155/70  03/24/20 138/80  03/18/20 (!) 144/74   Pulse Readings from Last 3 Encounters:  04/01/20 70  03/24/20 70  03/18/20 71    Renal function: CrCl cannot be calculated (Patient's most recent lab result is older than the maximum 21 days allowed.).  Past Medical History:  Diagnosis Date  . CAD (coronary artery disease)    ruptured plaque in OM 2003, NSTEMI/CABG 01/2020  . Headache(784.0)   . HLD (hyperlipidemia)   .  HTN (hypertension)   . Mild carotid artery disease (HCC)   . Pre-diabetes   . RBBB (right bundle branch block)     Current Outpatient Medications on File Prior to Visit  Medication Sig Dispense Refill  . aspirin EC 81 MG tablet Take 1 tablet (81 mg total) by mouth daily. Swallow whole. 90 tablet 3  . atorvastatin (LIPITOR) 40 MG tablet Take 1 tablet (40 mg total) by mouth daily. 90 tablet 3  . benazepril (LOTENSIN) 20 MG tablet Take 1 tablet (20 mg total) by mouth daily. 90 tablet 3  . clopidogrel (PLAVIX) 75 MG tablet Take 1 tablet (75 mg total) by mouth daily. 90 tablet 3  . diclofenac Sodium (VOLTAREN) 1 % GEL Apply 2 g topically 4 (four) times daily. 100 g 0  . methocarbamol (ROBAXIN) 500 MG tablet Take 1 tablet (500 mg total) by mouth  at bedtime as needed for muscle spasms. 10 tablet 0  . metoprolol tartrate (LOPRESSOR) 25 MG tablet Take 1.5 tablets (37.5 mg total) by mouth 2 (two) times daily. 270 tablet 3  . Multiple Vitamins-Minerals (MULTIVITAMIN WITH MINERALS) tablet Take 1 tablet by mouth daily.    . nitroGLYCERIN (NITROSTAT) 0.4 MG SL tablet Place 1 tablet (0.4 mg total) under the tongue every 5 (five) minutes as needed for chest pain. 90 tablet 3  . traMADol (ULTRAM) 50 MG tablet Take 1 tablet (50 mg total) by mouth every 6 (six) hours as needed. 40 tablet 0   No current facility-administered medications on file prior to visit.    No Known Allergies   Assessment/Plan:  1. Hypertension - BP elevated above goal of <130/55mmHg according to home BP readings and BP in clinic today. Considering patient is near goal without AM BP medications and BP remains elevated at home before and after BP medications, will increase benazepril to 40 mg daily and continue metoprolol 37.5mg  BID. Reviewed BP measurement technique. Discussed continuing to cut down on cigarette smoking and sodium intake. Encouraged patient to continue walking about 20 minutes per day.   Will see patient in one week to calibrate home BP monitor and for repeat BMET considering benazepril dose increase. Will follow-up with patient in 3 weeks for virtual visit.   Thank you,  Idamae Lusher  PharmD Candidate, Class of 2022   Olene Floss, Pharm.D, BCPS, CPP [facility]  [address], [place], [place] [address]  Phone: [phone]; Fax: [phone]

## 2020-04-18 NOTE — Patient Instructions (Signed)
It was pleasure seeing you today!   Start taking benazepril 40 mg daily (2 tablets of the 20 mg tablets until you run out)  Continue walking about 20 minutes per day. Continue cutting down on cigarette smoking and avoiding salt in your diet.   Bring your blood pressure monitor at 2:30pm on 04/25/20 when you come for labs and to see Dr. Delrae Alfred at [facility]. Continue taking your blood pressure 1-2 hours after taking your medications.

## 2020-04-19 MED ORDER — ISOSORBIDE DINITRATE 10 MG PO TABS: 10.0000 mg | ORAL_TABLET | Freq: Three times a day (TID) | ORAL | 1 refills | Status: DC

## 2020-04-19 NOTE — Addendum Note (Signed)
Addended by: Burnetta Sabin on: 04/19/2020 09:05 AM   Modules accepted: Orders

## 2020-04-19 NOTE — Telephone Encounter (Signed)
Call placed to pt to discuss lab results.  Was asked to relay this message to pt. Pt understands to restart Isosorbide Dinitrate 10 mg three times a day. I was asked to schedule pt to come in to be evaluated for the chest pain, so pt has been scheduled to see Dr. Clifton James 04/22/2020 at 2:20. Pt verbalized understanding

## 2020-04-19 NOTE — Progress Notes (Signed)
Pt has been made aware of normal result and verbalized understanding.  jw  See phone note

## 2020-04-22 ENCOUNTER — Ambulatory Visit: Payer: Self-pay | Admitting: Cardiovascular Disease

## 2020-04-22 ENCOUNTER — Telehealth: Payer: Self-pay | Admitting: Cardiovascular Disease

## 2020-04-22 NOTE — Progress Notes (Deleted)
No chief complaint on file.     History of Present Illness: 64 yo male with history of CAD s/p 4V CABG, HTN, HLD and chronic RBBB here today for follow up. He has history of CAD dating back to 2003. He was lost to follow up for many years. He was admitted to [facility] in August 2021 with chest pain and found to have a NSTEMI. Cardiac cath with severe multi-vessel CAD. He underwent 4V CABG August 2021 (LIMA to LAD, RIMA to left PDA, Left radial sequential to the OM1 and diagonal). Echo August 2021 with LVEF=60-65%. He has been on ASA and Plavix.   He is here today for follow up. The patient denies any chest pain, dyspnea, palpitations, lower extremity edema, orthopnea, PND, dizziness, near syncope or syncope.    Primary Care Physician: Patient, No Pcp Per   Past Medical History:  Diagnosis Date  . CAD (coronary artery disease)    ruptured plaque in OM 2003, NSTEMI/CABG 01/2020  . Headache(784.0)   . HLD (hyperlipidemia)   . HTN (hypertension)   . Mild carotid artery disease (HCC)   . Pre-diabetes   . RBBB (right bundle branch block)     Past Surgical History:  Procedure Laterality Date  . CORONARY ARTERY BYPASS GRAFT N/A 01/21/2020   Procedure: CORONARY ARTERY BYPASS GRAFTING (CABG) TIMES 4 USING LIMA TO LAD; RIMA TO PDA; RADIAL HARVEST TO DIAG AND RAMUS.;  Surgeon: [name], MD;  Location: [facility] OR;  Service: Open Heart Surgery;  Laterality: N/A;  BILATERAL IMA  . LEFT HEART CATH AND CORONARY ANGIOGRAPHY N/A 01/19/2020   Procedure: LEFT HEART CATH AND CORONARY ANGIOGRAPHY;  Surgeon: Marykay Lex, MD;  Location[facility] INVASIVE CV LAB;  Service: Cardiovascular;  Laterality: N/A;  . RADIAL ARTERY HARVEST Left 01/21/2020   Procedure: RADIAL ARTERY HARVEST;  Surgeon: [name], MD;  Location: [facility] OR;  Service: Open Heart Surgery;  Laterality: Left;  . TEE WITHOUT CARDIOVERSION N/A 01/21/2020   Procedure: TRANSESOPHAGEAL ECHOCARDIOGRAM (TEE);  Surgeon: [name], MD;   Location: [facility] OR;  Service: Open Heart Surgery;  Laterality: N/A;    Current Outpatient Medications  Medication Sig Dispense Refill  . aspirin EC 81 MG tablet Take 1 tablet (81 mg total) by mouth daily. Swallow whole. 90 tablet 3  . atorvastatin (LIPITOR) 40 MG tablet Take 1 tablet (40 mg total) by mouth daily. 90 tablet 3  . benazepril (LOTENSIN) 40 MG tablet Take 1 tablet (40 mg total) by mouth daily. 90 tablet 3  . clopidogrel (PLAVIX) 75 MG tablet Take 1 tablet (75 mg total) by mouth daily. 90 tablet 3  . diclofenac Sodium (VOLTAREN) 1 % GEL Apply 2 g topically 4 (four) times daily. 100 g 0  . isosorbide dinitrate (ISORDIL) 10 MG tablet Take 1 tablet (10 mg total) by mouth 3 (three) times daily. 90 tablet 1  . methocarbamol (ROBAXIN) 500 MG tablet Take 1 tablet (500 mg total) by mouth at bedtime as needed for muscle spasms. 10 tablet 0  . metoprolol tartrate (LOPRESSOR) 25 MG tablet Take 1.5 tablets (37.5 mg total) by mouth 2 (two) times daily. 270 tablet 3  . Multiple Vitamins-Minerals (MULTIVITAMIN WITH MINERALS) tablet Take 1 tablet by mouth daily.    . nitroGLYCERIN (NITROSTAT) 0.4 MG SL tablet Place 1 tablet (0.4 mg total) under the tongue every 5 (five) minutes as needed for chest pain. 90 tablet 3  . potassium chloride SA (KLOR-CON) 20 MEQ tablet Take 40  mEq by mouth daily.    . traMADol (ULTRAM) 50 MG tablet Take 1 tablet (50 mg total) by mouth every 6 (six) hours as needed. 40 tablet 0   No current facility-administered medications for this visit.    No Known Allergies  Social History   Socioeconomic History  . Marital status: Married    Spouse name: Not on file  . Number of children: Not on file  . Years of education: Not on file  . Highest education level: Not on file  Occupational History  . Not on file  Tobacco Use  . Smoking status: Current Some Day Smoker    Packs/day: 0.25  . Smokeless tobacco: Never Used  Vaping Use  . Vaping Use: Never used  Substance and  Sexual Activity  . Alcohol use: Yes    Comment: occ  . Drug use: No  . Sexual activity: Not on file  Other Topics Concern  . Not on file  Social History Narrative   Not working; helps out at a [employer].    Lives with friend [name]; has a girlfriend.    Social Determinants of Health   Financial Resource Strain:   . Difficulty of Paying Living Expenses: Not on file  Food Insecurity:   . Worried About Programme researcher, broadcasting/film/video in the Last Year: Not on file  . Ran Out of Food in the Last Year: Not on file  Transportation Needs:   . Lack of Transportation (Medical): Not on file  . Lack of Transportation (Non-Medical): Not on file  Physical Activity:   . Days of Exercise per Week: Not on file  . Minutes of Exercise per Session: Not on file  Stress:   . Feeling of Stress : Not on file  Social Connections:   . Frequency of Communication with Friends and Family: Not on file  . Frequency of Social Gatherings with Friends and Family: Not on file  . Attends Religious Services: Not on file  . Active Member of Clubs or Organizations: Not on file  . Attends Banker Meetings: Not on file  . Marital Status: Not on file  Intimate Partner Violence:   . Fear of Current or Ex-Partner: Not on file  . Emotionally Abused: Not on file  . Physically Abused: Not on file  . Sexually Abused: Not on file    Family History  Family history unknown: Yes    Review of Systems:  As stated in the HPI and otherwise negative.   There were no vitals taken for this visit.  Physical Examination: General: Well developed, well nourished, NAD  HEENT: OP clear, mucus membranes moist  SKIN: warm, dry. No rashes. Neuro: No focal deficits  Musculoskeletal: Muscle strength 5/5 all ext  Psychiatric: Mood and affect normal  Neck: No JVD, no carotid bruits, no thyromegaly, no lymphadenopathy.  Lungs:Clear bilaterally, no wheezes, rhonci, crackles Cardiovascular: Regular rate and rhythm. No murmurs,  gallops or rubs. Abdomen:Soft. Bowel sounds present. Non-tender.  Extremities: No lower extremity edema. Pulses are 2 + in the bilateral DP/PT.  EKG:  EKG {ACTION; IS/IS [id number]} ordered today. The ekg ordered today demonstrates ***  Recent Labs: 01/20/2020: TSH 2.698 01/22/2020: Magnesium 2.4 03/18/2020: Hemoglobin 12.8; Platelets 285 04/18/2020: ALT 21; BUN 15; Creatinine, Ser 0.96; Potassium 3.7; Sodium 142   Lipid Panel    Component Value Date/Time   CHOL 136 03/18/2020 1114   TRIG 71 03/18/2020 1114   HDL 76 03/18/2020 1114   CHOLHDL 1.8 03/18/2020  1114   CHOLHDL 3.3 01/20/2020 0737   VLDL 19 01/20/2020 0737   LDLCALC 46 03/18/2020 1114     Wt Readings from Last 3 Encounters:  03/18/20 153 lb 6.4 oz (69.6 kg)  02/15/20 152 lb (68.9 kg)  02/05/20 152 lb 3.2 oz (69 kg)      Assessment and Plan:   1. CAD s/p CABG with angina: He is having some mild chest pains. He is now back on Isordil.  Continue ASA, Plavix for one year post CABG since he presented with ACS. Continue statin and beta blocker.   2. Hyperlipidemia: LDL at goal. Continue statin  Current medicines are reviewed at length with the patient today.  The patient {ACTIONS; HAS/DOES NOT HAVE:19233} concerns regarding medicines.  The following changes have been made:  {PLAN; NO CHANGE:13088:s}  Labs/ tests ordered today include: *** No orders of the defined types were placed in this encounter.    Disposition:   FU with *** in {gen number [id number]} {TIME; UNITS DAY/WEEK/MONTH:19136}   Signed, Verne Carrow, MD 04/22/2020 6:25 AM    [facility] [address] [place], [place], [place]  [address] Phone: [phone]; Fax: [phone]

## 2020-04-22 NOTE — Telephone Encounter (Signed)
Pt did not show up for his appointment today.

## 2020-04-25 ENCOUNTER — Other Ambulatory Visit: Payer: Self-pay

## 2020-04-25 ENCOUNTER — Ambulatory Visit: Payer: Self-pay | Admitting: Internal Medicine

## 2020-04-25 ENCOUNTER — Other Ambulatory Visit: Payer: Self-pay | Admitting: *Deleted

## 2020-04-25 DIAGNOSIS — I10 Essential (primary) hypertension: Secondary | ICD-10-CM

## 2020-04-26 ENCOUNTER — Telehealth: Payer: Self-pay | Admitting: Pharmacist

## 2020-04-26 LAB — BASIC METABOLIC PANEL
BUN/Creatinine Ratio: 16 (ref 10–24)
BUN: 17 mg/dL (ref 8–27)
CO2: 26 mmol/L (ref 20–29)
Calcium: 9.2 mg/dL (ref 8.6–10.2)
Chloride: 104 mmol/L (ref 96–106)
Creatinine, Ser: 1.05 mg/dL (ref 0.76–1.27)
GFR calc Af Amer: 85 mL/min/{1.73_m2} (ref >59)
GFR calc non Af Amer: 74 mL/min/{1.73_m2} (ref >59)
Glucose: 96 mg/dL (ref 65–99)
Potassium: 4.1 mmol/L (ref 3.5–5.2)
Sodium: 143 mmol/L (ref 134–144)

## 2020-04-26 NOTE — Telephone Encounter (Signed)
Called pt and let him know his labs are stable. Was going to do virtual apt with pt on the 20th but he did not bring his monitor in for calibration. Now meter is saying error. I have rescheduled him for in person visit on 12/20.

## 2020-05-09 ENCOUNTER — Other Ambulatory Visit: Payer: Self-pay

## 2020-05-09 ENCOUNTER — Ambulatory Visit (INDEPENDENT_AMBULATORY_CARE_PROVIDER_SITE_OTHER): Payer: Self-pay | Admitting: Pharmacist

## 2020-05-09 ENCOUNTER — Telehealth: Payer: Self-pay

## 2020-05-09 VITALS — BP 120/77 | HR 79

## 2020-05-09 DIAGNOSIS — I10 Essential (primary) hypertension: Secondary | ICD-10-CM

## 2020-05-09 LAB — BASIC METABOLIC PANEL
BUN/Creatinine Ratio: 14 (ref 10–24)
BUN: 13 mg/dL (ref 8–27)
CO2: 23 mmol/L (ref 20–29)
Calcium: 9.8 mg/dL (ref 8.6–10.2)
Chloride: 101 mmol/L (ref 96–106)
Creatinine, Ser: 0.93 mg/dL (ref 0.76–1.27)
GFR calc Af Amer: 99 mL/min/{1.73_m2} (ref >59)
GFR calc non Af Amer: 85 mL/min/{1.73_m2} (ref >59)
Glucose: 113 mg/dL — ABNORMAL HIGH (ref 65–99)
Potassium: 4.4 mmol/L (ref 3.5–5.2)
Sodium: 139 mmol/L (ref 134–144)

## 2020-05-09 NOTE — Progress Notes (Signed)
Patient ID: Todd Gonzalez                 DOB: 08-Dec-1955                      MRN: [id number]     HPI: Todd Gonzalez is a 64 y.o. male patient of Dr Clifton James referred by Todd Spies, PA to HTN clinic. PMH is significant for CAD s/p ruptured plaque in OM in 2003, NSTEMI with CABG x4v on 01/21/20, HTN, HLD, pre-DM (A1c 6.1 in 01/2020), and 1-39% bilateral carotid artery stenosis. BP was elevated at 144/74 at last visit, however pt had not taken any of his morning medications. He was provided with a BP cuff to monitor his readings at home and was referred to HTN clinic for follow up.   At last visit with PharmD clinic on 04/18/20, patient was tolerating meds well.  He had checked his BP a few times at home but did not bring his cuff in with him. He recalled a reading of 130's-140's. He also reported some chest pain and has used Nitroglycerin. After speaking with Dr. Clifton James, we resumed his isosorbide dinitrate 10mg  TID. He was scheduled to see Dr. about his chest pain but he no showed.  Benazepril was increased to 40 mg daily along with the addition of isosorbide. Reported cutting back on his cigarette smoking to 2 cigarettes per day.  Patient presents today for follow up. He denies dizziness, lightheadedness, blurred vision, SOB, swelling. Had one headache that resolved quickly without medications. Brought in empty bottle of clopidogrel, ran out on Friday. Asking if he is supposed to continue to take.  Advised he should continue.  Patient is still walking daily. Smoking 2 cigarettes a day. States he can quite completely. Denies any chest pain. He did start taking isosorbide.  Has not taken his medications this AM. Usually takes them around 9:30-10:00. It is now 10:45.  Not checking his blood pressure because he keeps getting an error message. He did bring it with him today.   Current HTN meds: benazepril 40mg  daily (AM), metoprolol tartrate 37.5mg  BID, isosorbide dinitrate 10mg  TID  BP  goal: <130/65mmHg  Family History: Unknown by patient.  Social History: Smokes 2 cigarettes per day (was 1/4 PPD), denies drug use, occasional alcohol use.  Diet: Doesn't add salt to food. Likes boiled plantains and potatoes, corncakes. Drinks water, sometimes fruit juice.  Exercise: Walks 150 yards, to/from bus stop, walks 20 to 25 minutes per day. Stays busy with work.   Home BP Readings: none available    Wt Readings from Last 3 Encounters:  03/18/20 153 lb 6.4 oz (69.6 kg)  02/15/20 152 lb (68.9 kg)  02/05/20 152 lb 3.2 oz (69 kg)   BP Readings from Last 3 Encounters:  04/18/20 138/72  04/01/20 (!) 155/70  03/24/20 138/80   Pulse Readings from Last 3 Encounters:  04/18/20 87  04/01/20 70  03/24/20 70    Renal function: CrCl cannot be calculated (Unknown ideal weight.).  Past Medical History:  Diagnosis Date  . CAD (coronary artery disease)    ruptured plaque in OM 2003, NSTEMI/CABG 01/2020  . Headache(784.0)   . HLD (hyperlipidemia)   . HTN (hypertension)   . Mild carotid artery disease (HCC)   . Pre-diabetes   . RBBB (right bundle branch block)     Current Outpatient Medications on File Prior to Visit  Medication Sig Dispense Refill  . aspirin EC 81  MG tablet Take 1 tablet (81 mg total) by mouth daily. Swallow whole. 90 tablet 3  . atorvastatin (LIPITOR) 40 MG tablet Take 1 tablet (40 mg total) by mouth daily. 90 tablet 3  . benazepril (LOTENSIN) 40 MG tablet Take 1 tablet (40 mg total) by mouth daily. 90 tablet 3  . clopidogrel (PLAVIX) 75 MG tablet Take 1 tablet (75 mg total) by mouth daily. 90 tablet 3  . diclofenac Sodium (VOLTAREN) 1 % GEL Apply 2 g topically 4 (four) times daily. 100 g 0  . isosorbide dinitrate (ISORDIL) 10 MG tablet Take 1 tablet (10 mg total) by mouth 3 (three) times daily. 90 tablet 1  . methocarbamol (ROBAXIN) 500 MG tablet Take 1 tablet (500 mg total) by mouth at bedtime as needed for muscle spasms. 10 tablet 0  . metoprolol  tartrate (LOPRESSOR) 25 MG tablet Take 1.5 tablets (37.5 mg total) by mouth 2 (two) times daily. 270 tablet 3  . Multiple Vitamins-Minerals (MULTIVITAMIN WITH MINERALS) tablet Take 1 tablet by mouth daily.    . nitroGLYCERIN (NITROSTAT) 0.4 MG SL tablet Place 1 tablet (0.4 mg total) under the tongue every 5 (five) minutes as needed for chest pain. 90 tablet 3  . potassium chloride SA (KLOR-CON) 20 MEQ tablet Take 40 mEq by mouth daily.    . traMADol (ULTRAM) 50 MG tablet Take 1 tablet (50 mg total) by mouth every 6 (six) hours as needed. 40 tablet 0   No current facility-administered medications on file prior to visit.    No Known Allergies   Assessment/Plan:  1. Hypertension - BP is at goal of <130/80 today. I did attempt to check his blood pressure with his home cuff, but it would not give a reading because it said its battery was low. Advised patient that error message may have been due to low battery. Advised he get new batteries or use the cord to plug the monitor in. No medication changes today. Continue benazepril 40mg  daily (AM), metoprolol tartrate 37.5mg  BID, isosorbide dinitrate 10mg  TID. Advised he call in his refill for clopidogrel as he is supposed to continue per note on 02/05/20. Follow up via telephone in 1 month. BMP today due to increase in benazepril last visit.   Thank you,    Wanda Plump, Pharm.D, BCPS, CPP [facility]  [address] [place], [address] [place]  Phone: [phone]; Fax: [phone]

## 2020-05-09 NOTE — Patient Instructions (Addendum)
Please continue benazepril 40mg  daily (AM), metoprolol tartrate 37.5mg  twice a day, isosorbide dinitrate 10mg  three times a day  Please plug in BP machine or get new batteries. If you continue to get an error, please call me.  Please check your blood pressure once a day and write down the readings  Call me at [phone] with any questions

## 2020-05-10 ENCOUNTER — Telehealth: Payer: Self-pay | Admitting: Pharmacist

## 2020-05-10 NOTE — Telephone Encounter (Signed)
BMP stable since increase in benazepril. Continue benazepril 40mg  daily (AM), metoprolol tartrate 37.5mg  BID, isosorbide dinitrate 10mg  TID. Follow up in 4 weeks.  Called pt a relayed message above. He states he will go to store today for batteries for his monitor. He is to call if he still has issues with it.

## 2020-06-07 ENCOUNTER — Telehealth (INDEPENDENT_AMBULATORY_CARE_PROVIDER_SITE_OTHER): Payer: Self-pay | Admitting: Pharmacist

## 2020-06-07 ENCOUNTER — Other Ambulatory Visit: Payer: Self-pay

## 2020-06-07 VITALS — BP 130/75

## 2020-06-07 DIAGNOSIS — I10 Essential (primary) hypertension: Secondary | ICD-10-CM

## 2020-06-07 MED ORDER — ISOSORBIDE DINITRATE 10 MG PO TABS
10.0000 mg | ORAL_TABLET | Freq: Three times a day (TID) | ORAL | 1 refills | Status: DC
Start: 2020-06-07 — End: 2020-09-06

## 2020-06-07 NOTE — Progress Notes (Signed)
Patient ID: Todd Gonzalez                 DOB: 1956/04/05                      MRN: [id number]     HPI: Todd Gonzalez is a 65 y.o. male patient of Dr Clifton James referred by Todd Spies, PA to HTN clinic. PMH is significant for CAD s/p ruptured plaque in OM in 2003, NSTEMI with CABG x4v on 01/21/20, HTN, HLD, pre-DM (A1c 6.1 in 01/2020), and 1-39% bilateral carotid artery stenosis. BP was elevated at 144/74 at last visit, however pt had not taken any of his morning medications. He was provided with a BP cuff to monitor his readings at home and was referred to HTN clinic for follow up.   At last visit with PharmD clinic on 05/10/20, patient was tolerating meds well.  He had brought his cuff in with him, but could not calibrate because the batteries were low. Blood pressure in clinic was 120/77. BMP was stable after benazepril increase.   Patient presents via telephone today for follow up. He denies dizziness, lightheadedness, headache, blurred vision, SOB, swelling. He had one episode of chest pain. Took nitro and it went away. Still not taking the isosorbide that he was advised several times to resume.  Smoking 2 cigarettes a day. States he can quite completely.  Not checking his blood pressure because he keeps getting an error message still. He states he put new batteries in it. He was able to get one blood pressure of 130/75 HR 72. He did check it multiple times while on the phone and got several error messages.  Current HTN meds: benazepril 40mg  daily (AM), metoprolol tartrate 37.5mg  BID, isosorbide dinitrate 10mg  TID   BP goal: <130/57mmHg  Family History: Unknown by patient.  Social History: Smokes 2 cigarettes per day (was 1/4 PPD), denies drug use, occasional alcohol use.  Diet: Doesn't add salt to food. Likes boiled plantains and potatoes, corncakes. Drinks water, sometimes fruit juice.  Exercise: Walks 150 yards, to/from bus stop, walks 20 to 25 minutes per day. Stays busy with work.    Home BP Readings: none available    Wt Readings from Last 3 Encounters:  03/18/20 153 lb 6.4 oz (69.6 kg)  02/15/20 152 lb (68.9 kg)  02/05/20 152 lb 3.2 oz (69 kg)   BP Readings from Last 3 Encounters:  05/09/20 120/77  04/18/20 138/72  04/01/20 (!) 155/70   Pulse Readings from Last 3 Encounters:  05/09/20 79  04/18/20 87  04/01/20 70    Renal function: CrCl cannot be calculated (Patient's most recent lab result is older than the maximum 21 days allowed.).  Past Medical History:  Diagnosis Date  . CAD (coronary artery disease)    ruptured plaque in OM 2003, NSTEMI/CABG 01/2020  . Headache(784.0)   . HLD (hyperlipidemia)   . HTN (hypertension)   . Mild carotid artery disease (HCC)   . Pre-diabetes   . RBBB (right bundle branch block)     Current Outpatient Medications on File Prior to Visit  Medication Sig Dispense Refill  . aspirin EC 81 MG tablet Take 1 tablet (81 mg total) by mouth daily. Swallow whole. 90 tablet 3  . atorvastatin (LIPITOR) 40 MG tablet Take 1 tablet (40 mg total) by mouth daily. 90 tablet 3  . benazepril (LOTENSIN) 40 MG tablet Take 1 tablet (40 mg total) by mouth daily. 90 tablet  3  . clopidogrel (PLAVIX) 75 MG tablet Take 1 tablet (75 mg total) by mouth daily. 90 tablet 3  . diclofenac Sodium (VOLTAREN) 1 % GEL Apply 2 g topically 4 (four) times daily. (Patient not taking: Reported on 05/09/2020) 100 g 0  . isosorbide dinitrate (ISORDIL) 10 MG tablet Take 1 tablet (10 mg total) by mouth 3 (three) times daily. 90 tablet 1  . methocarbamol (ROBAXIN) 500 MG tablet Take 1 tablet (500 mg total) by mouth at bedtime as needed for muscle spasms. (Patient not taking: Reported on 05/09/2020) 10 tablet 0  . metoprolol tartrate (LOPRESSOR) 25 MG tablet Take 1.5 tablets (37.5 mg total) by mouth 2 (two) times daily. 270 tablet 3  . Multiple Vitamins-Minerals (MULTIVITAMIN WITH MINERALS) tablet Take 1 tablet by mouth daily.    . nitroGLYCERIN (NITROSTAT) 0.4 MG  SL tablet Place 1 tablet (0.4 mg total) under the tongue every 5 (five) minutes as needed for chest pain. 90 tablet 3  . potassium chloride SA (KLOR-CON) 20 MEQ tablet Take 40 mEq by mouth daily. (Patient not taking: Reported on 05/09/2020)    . traMADol (ULTRAM) 50 MG tablet Take 1 tablet (50 mg total) by mouth every 6 (six) hours as needed. 40 tablet 0   No current facility-administered medications on file prior to visit.    No Known Allergies   Assessment/Plan:  1. Hypertension - BP is about at goal of <130/80 with the one reading patient provided. Still having issues with cuff and getting an error message despite changing batteries. I tried to trouble shoot over the phone, but cannot seem to figure out what's wrong with it without looking at it.  Patient will come to the office in a few weeks. If we cannot get it to work, I will reach out to social work to see if we can get him another one. Continue benazepril 40mg  daily (AM), metoprolol tartrate 37.5mg  BID. Patient advised to start tajing isosorbide dinitrate 10mg  TID. Advised this will help avoid chest pain. New Rx sent into pharmacy again. Follow up in 2 weeks.  Thank you,    , Pharm.D, BCPS, CPP [facility]  [address], [place], [address] [place]  Phone: [phone]; Fax: [phone]

## 2020-06-21 ENCOUNTER — Ambulatory Visit (INDEPENDENT_AMBULATORY_CARE_PROVIDER_SITE_OTHER): Payer: Self-pay | Admitting: Pharmacist

## 2020-06-21 ENCOUNTER — Other Ambulatory Visit: Payer: Self-pay

## 2020-06-21 VITALS — BP 140/82 | HR 62

## 2020-06-21 DIAGNOSIS — I10 Essential (primary) hypertension: Secondary | ICD-10-CM

## 2020-06-21 MED ORDER — AMLODIPINE BESYLATE 5 MG PO TABS: 2.5000 mg | ORAL_TABLET | Freq: Every day | ORAL | 3 refills | Status: DC

## 2020-06-21 NOTE — Patient Instructions (Addendum)
Please start taking amlodipine 2.5mg  (1/2 tablet) by mouth daily  Keep checking your blood pressure once day  Call me at [phone] with any questions

## 2020-06-21 NOTE — Progress Notes (Signed)
Patient ID: Todd Gonzalez                 DOB: Mar 12, 1956                      MRN: [id number]     HPI: Todd Gonzalez is a 65 y.o. male patient of Dr Clifton James referred by Ronie Spies, PA to HTN clinic. PMH is significant for CAD s/p ruptured plaque in OM in 2003, NSTEMI with CABG x4v on 01/21/20, HTN, HLD, pre-DM (A1c 6.1 in 01/2020), and 1-39% bilateral carotid artery stenosis. BP was elevated at 144/74 at last visit, however pt had not taken any of his morning medications. He was provided with a BP cuff to monitor his readings at home and was referred to HTN clinic for follow up.   At last visit with PharmD clinic on 05/10/20, patient was tolerating meds well.  He had brought his cuff in with him, but could not calibrate because the batteries were low. Blood pressure in clinic was 120/77. BMP was stable after benazepril increase. Patient had a virtual appointment on 06/07/20. His home BP meter was still not working correctly. No medication changes were made.  Presents today to assist with BP meter and check blood pressure. He brings his blood pressure monitor with him. Upon attempting two blood pressure readings, the machine gave an error message both times.  Denies dizziness, lightheadedness, SOB or swelling. He states occassionally when he walks he gets some chest discomfort. He has started to take isosorbide. Has not missed any BP mediation doses. Took his blood pressure medications this AM round 7:00 AM.  He had his blood pressure checked at [facility]. They told him it was good, but did not tell him what it was. Last night a friend check it and it was around 135 systolic. First reading today was in the 180's but came down to 140/82 after resting.  Current HTN meds: benazepril 40mg  daily (AM), metoprolol tartrate 37.5mg  BID, isosorbide dinitrate 10mg  TID   BP goal: <130/63mmHg  Family History: Unknown by patient.  Social History: Smokes 2 cigarettes per day (was 1/4 PPD), denies  drug use, occasional alcohol use.  Diet: Doesn't add salt to food. Likes boiled plantains and potatoes, corncakes. Drinks water, sometimes fruit juice.  Exercise: Walks 150 yards, to/from bus stop, walks 20 to 25 minutes per day. Went back to work )   Home BP Readings: states usually 130-40 systolic   Wt Readings from Last 3 Encounters:  03/18/20 153 lb 6.4 oz (69.6 kg)  02/15/20 152 lb (68.9 kg)  02/05/20 152 lb 3.2 oz (69 kg)   BP Readings from Last 3 Encounters:  06/07/20 130/75  05/09/20 120/77  04/18/20 138/72   Pulse Readings from Last 3 Encounters:  05/09/20 79  04/18/20 87  04/01/20 70    Renal function: CrCl cannot be calculated (Patient's most recent lab result is older than the maximum 21 days allowed.).  Past Medical History:  Diagnosis Date  . CAD (coronary artery disease)    ruptured plaque in OM 2003, NSTEMI/CABG 01/2020  . Headache(784.0)   . HLD (hyperlipidemia)   . HTN (hypertension)   . Mild carotid artery disease (HCC)   . Pre-diabetes   . RBBB (right bundle branch block)     Current Outpatient Medications on File Prior to Visit  Medication Sig Dispense Refill  . aspirin EC 81 MG tablet Take 1 tablet (81 mg total) by  mouth daily. Swallow whole. 90 tablet 3  . atorvastatin (LIPITOR) 40 MG tablet Take 1 tablet (40 mg total) by mouth daily. 90 tablet 3  . benazepril (LOTENSIN) 40 MG tablet Take 1 tablet (40 mg total) by mouth daily. 90 tablet 3  . clopidogrel (PLAVIX) 75 MG tablet Take 1 tablet (75 mg total) by mouth daily. 90 tablet 3  . diclofenac Sodium (VOLTAREN) 1 % GEL Apply 2 g topically 4 (four) times daily. (Patient not taking: Reported on 05/09/2020) 100 g 0  . isosorbide dinitrate (ISORDIL) 10 MG tablet Take 1 tablet (10 mg total) by mouth 3 (three) times daily. 90 tablet 1  . methocarbamol (ROBAXIN) 500 MG tablet Take 1 tablet (500 mg total) by mouth at bedtime as needed for muscle spasms. (Patient not taking: Reported on  05/09/2020) 10 tablet 0  . metoprolol tartrate (LOPRESSOR) 25 MG tablet Take 1.5 tablets (37.5 mg total) by mouth 2 (two) times daily. 270 tablet 3  . Multiple Vitamins-Minerals (MULTIVITAMIN WITH MINERALS) tablet Take 1 tablet by mouth daily.    . nitroGLYCERIN (NITROSTAT) 0.4 MG SL tablet Place 1 tablet (0.4 mg total) under the tongue every 5 (five) minutes as needed for chest pain. 90 tablet 3  . potassium chloride SA (KLOR-CON) 20 MEQ tablet Take 40 mEq by mouth daily. (Patient not taking: Reported on 05/09/2020)    . traMADol (ULTRAM) 50 MG tablet Take 1 tablet (50 mg total) by mouth every 6 (six) hours as needed. 40 tablet 0   No current facility-administered medications on file prior to visit.    No Known Allergies   Assessment/Plan:  1. Hypertension - BP is above goal in clinic today. Will start amlodipine 2.5mg  daily. Patient was given new BP cuff from patient care fund. Calibrated and found to be accurate. Patient to continue to check BP at home daily with new cuff. Follow up via telephone in 1 month.   Thank you,    Olene Floss, Pharm.D, BCPS, CPP [facility]  [address], [place], [place] [address]  Phone: [phone]; Fax: [phone]

## 2020-07-07 ENCOUNTER — Telehealth: Payer: Self-pay | Admitting: Cardiovascular Disease

## 2020-07-07 NOTE — Telephone Encounter (Signed)
Will send to Pharm-D as pt recently seen by our HTN clinic.

## 2020-07-07 NOTE — Telephone Encounter (Signed)
Spoke with patient. Reviewed that he should be taking 1 of the benazapril 40mg  and 1/2 of the amlodipine 5mg 

## 2020-07-07 NOTE — Telephone Encounter (Signed)
New message:    He needs directions for pt's Benazepril.

## 2020-07-19 ENCOUNTER — Other Ambulatory Visit: Payer: Self-pay

## 2020-07-19 ENCOUNTER — Telehealth (INDEPENDENT_AMBULATORY_CARE_PROVIDER_SITE_OTHER): Payer: Self-pay | Admitting: Pharmacist

## 2020-07-19 VITALS — BP 150/72 | HR 55

## 2020-07-19 DIAGNOSIS — I10 Essential (primary) hypertension: Secondary | ICD-10-CM

## 2020-07-19 MED ORDER — AMLODIPINE BESYLATE 5 MG PO TABS: 5.0000 mg | ORAL_TABLET | Freq: Every day | ORAL | 3 refills | Status: DC

## 2020-07-19 NOTE — Progress Notes (Signed)
Patient ID: Todd Gonzalez                 DOB: [birth year]                      MRN: [id number]     HPI: Todd Gonzalez is a 65 y.o. male patient of Dr Clifton James referred by Ronie Spies, PA to HTN clinic. PMH is significant for CAD s/p ruptured plaque in OM in 2003, NSTEMI with CABG x4v on 01/21/20, HTN, HLD, pre-DM (A1c 6.1 in 01/2020), and 1-39% bilateral carotid artery stenosis. BP was elevated at 144/74 at last visit, however pt had not taken any of his morning medications. He was provided with a BP cuff to monitor his readings at home and was referred to HTN clinic for follow up.   At last visit with PharmD clinic on 06/21/20, patient was tolerating meds well. His blood pressure was elevated at 140/82. Amlodipine 2.5mg  daily was started. He was given a new BP since his was not working.  He presents today for follow up. Denies dizziness, lightheadedness, headache, blurred vision, SOB or swelling. Tells of how he took his BP 2 days ago. First was 170's then down to 140's then 130's then 126 then back up to 130's. I was able to look at his monitor and write down readings. Looks like he checks a lot on the same day. Overall his recent average is not bad.  BP elevated in clinic today. Confirmed that he took meds today at 8:30.   Current HTN meds: benazepril 40mg  daily (AM), metoprolol tartrate 37.5mg  BID, isosorbide dinitrate 10mg  TID, amlodipine 2.5mg  daily  BP goal: <130/88mmHg  Family History: Unknown by patient.  Social History: Smokes 2 cigarettes per day (was 1/4 PPD), denies drug use, occasional alcohol use.  Diet: Doesn't add salt to food. Likes boiled plantains and potatoes, corncakes. Drinks water, sometimes fruit juice.  Exercise: Walks 150 yards, to/from bus stop, walks 20 to 25 minutes per day. Went back to work )   Home BP Readings: average 131/75, 133/74,132/77,130/75, 129/75, 142/74, 144/76, 145/77, 145/78,148/79,148/80,136/77, 136/77, 152/76,  126/76,172/74,141/83, 141/82 HR mainly 60's   Wt Readings from Last 3 Encounters:  03/18/20 153 lb 6.4 oz (69.6 kg)  02/15/20 152 lb (68.9 kg)  02/05/20 152 lb 3.2 oz (69 kg)   BP Readings from Last 3 Encounters:  06/21/20 140/82  06/07/20 130/75  05/09/20 120/77   Pulse Readings from Last 3 Encounters:  06/21/20 62  05/09/20 79  04/18/20 87    Renal function: CrCl cannot be calculated (Patient's most recent lab result is older than the maximum 21 days allowed.).  Past Medical History:  Diagnosis Date  . CAD (coronary artery disease)    ruptured plaque in OM 2003, NSTEMI/CABG 01/2020  . Headache(784.0)   . HLD (hyperlipidemia)   . HTN (hypertension)   . Mild carotid artery disease (HCC)   . Pre-diabetes   . RBBB (right bundle branch block)     Current Outpatient Medications on File Prior to Visit  Medication Sig Dispense Refill  . amLODipine (NORVASC) 5 MG tablet Take 0.5 tablets (2.5 mg total) by mouth daily. 45 tablet 3  . aspirin EC 81 MG tablet Take 1 tablet (81 mg total) by mouth daily. Swallow whole. 90 tablet 3  . atorvastatin (LIPITOR) 40 MG tablet Take 1 tablet (40 mg total) by mouth daily. 90 tablet 3  . benazepril (LOTENSIN) 40 MG tablet Take 1 tablet (  40 mg total) by mouth daily. 90 tablet 3  . clopidogrel (PLAVIX) 75 MG tablet Take 1 tablet (75 mg total) by mouth daily. 90 tablet 3  . diclofenac Sodium (VOLTAREN) 1 % GEL Apply 2 g topically 4 (four) times daily. (Patient not taking: Reported on 05/09/2020) 100 g 0  . isosorbide dinitrate (ISORDIL) 10 MG tablet Take 1 tablet (10 mg total) by mouth 3 (three) times daily. 90 tablet 1  . methocarbamol (ROBAXIN) 500 MG tablet Take 1 tablet (500 mg total) by mouth at bedtime as needed for muscle spasms. (Patient not taking: Reported on 05/09/2020) 10 tablet 0  . metoprolol tartrate (LOPRESSOR) 25 MG tablet Take 1.5 tablets (37.5 mg total) by mouth 2 (two) times daily. 270 tablet 3  . Multiple Vitamins-Minerals  (MULTIVITAMIN WITH MINERALS) tablet Take 1 tablet by mouth daily.    . nitroGLYCERIN (NITROSTAT) 0.4 MG SL tablet Place 1 tablet (0.4 mg total) under the tongue every 5 (five) minutes as needed for chest pain. 90 tablet 3  . potassium chloride SA (KLOR-CON) 20 MEQ tablet Take 40 mEq by mouth daily. (Patient not taking: Reported on 05/09/2020)    . traMADol (ULTRAM) 50 MG tablet Take 1 tablet (50 mg total) by mouth every 6 (six) hours as needed. 40 tablet 0   No current facility-administered medications on file prior to visit.    No Known Allergies   Assessment/Plan:  1. Hypertension - BP elevated above goal of <130/80 today in clinic. Home reading appear better at times- although quite variable. He is checking 5-6 times if his BP is high. I advised him to only check 1-3 times per day. Checking so many times can actually make BP go higher as it causes more stress to many people. Increase amlodipine to 5mg  daily. I will follow up with patient via telephone in 3 weeks.   Thank you,   , Pharm.D, BCPS, CPP [facility]  [address], [place], [place]  Phone: [phone]; Fax: [phone]

## 2020-07-19 NOTE — Patient Instructions (Addendum)
START taking 1 whole tablet of amlodipine 5mg  daily  Continue taking benazepril 40mg  daily (AM), metoprolol tartrate 37.5mg  BID and isosorbide dinitrate 10mg  TID   I will CALL you on the phone 3/22 at 9:30 AM

## 2020-08-01 ENCOUNTER — Ambulatory Visit: Payer: Self-pay | Admitting: Internal Medicine

## 2020-08-09 ENCOUNTER — Telehealth (INDEPENDENT_AMBULATORY_CARE_PROVIDER_SITE_OTHER): Payer: Self-pay | Admitting: Pharmacist

## 2020-08-09 ENCOUNTER — Other Ambulatory Visit: Payer: Self-pay

## 2020-08-09 DIAGNOSIS — I10 Essential (primary) hypertension: Secondary | ICD-10-CM

## 2020-08-09 MED ORDER — AMLODIPINE BESYLATE 10 MG PO TABS: 10.0000 mg | ORAL_TABLET | Freq: Every day | ORAL | 3 refills | Status: DC

## 2020-08-09 NOTE — Progress Notes (Signed)
Patient ID: Todd Gonzalez                 DOB: 04-Nov-1955                      MRN: [id number]     HPI: Todd Gonzalez is a 65 y.o. male patient of Dr Clifton James referred by Ronie Spies, PA to HTN clinic. PMH is significant for CAD s/p ruptured plaque in OM in 2003, NSTEMI with CABG x4v on 01/21/20, HTN, HLD, pre-DM (A1c 6.1 in 01/2020), and 1-39% bilateral carotid artery stenosis. BP was elevated at 144/74 at last visit, however pt had not taken any of his morning medications. He was provided with a BP cuff to monitor his readings at home and was referred to HTN clinic for follow up.   At last visit with PharmD clinic on 07/19/20, patient's BP in clinic was elevated. He was checking his blood pressure repetitively at home concerned over the higher readings. However, his average was not all that bad (131/75). Amlodipine was increased to 5mg  daily. Patient was encouraged not to check blood pressure as often to avoid anxiety over it.  Follow done today via telephone. He denies dizziness, lightheadedness, headache, blurred vision, SOB or swelling. Reports home BO running about 131-134/80-85. States he feels "alright."  Current HTN meds: benazepril 40mg  daily (AM), metoprolol tartrate 37.5mg  BID, isosorbide dinitrate 10mg  TID, amlodipine 5mg  daily  BP goal: <130/16mmHg  Family History: Unknown by patient.  Social History: Smokes 2 cigarettes per day (was 1/4 PPD), denies drug use, occasional alcohol use.  Diet: Doesn't add salt to food. Likes boiled plantains and potatoes, corncakes. Drinks water, sometimes fruit juice.  Exercise: Walks 150 yards, to/from bus stop, walks 20 to 25 minutes per day. Went back to work ( )   Home BP Readings:   Wt Readings from Last 3 Encounters:  03/18/20 153 lb 6.4 oz (69.6 kg)  02/15/20 152 lb (68.9 kg)  02/05/20 152 lb 3.2 oz (69 kg)   BP Readings from Last 3 Encounters:  07/19/20 (!) 150/72  06/21/20 140/82  06/07/20 130/75   Pulse Readings  from Last 3 Encounters:  07/19/20 (!) 55  06/21/20 62  05/09/20 79    Renal function: CrCl cannot be calculated (Patient's most recent lab result is older than the maximum 21 days allowed.).  Past Medical History:  Diagnosis Date  . CAD (coronary artery disease)    ruptured plaque in OM 2003, NSTEMI/CABG 01/2020  . Headache(784.0)   . HLD (hyperlipidemia)   . HTN (hypertension)   . Mild carotid artery disease (HCC)   . Pre-diabetes   . RBBB (right bundle branch block)     Current Outpatient Medications on File Prior to Visit  Medication Sig Dispense Refill  . amLODipine (NORVASC) 5 MG tablet Take 1 tablet (5 mg total) by mouth daily. Pharmacy please d/c rx for amlodipine- take 1/2 tablet 180 tablet 3  . aspirin EC 81 MG tablet Take 1 tablet (81 mg total) by mouth daily. Swallow whole. 90 tablet 3  . atorvastatin (LIPITOR) 40 MG tablet Take 1 tablet (40 mg total) by mouth daily. 90 tablet 3  . benazepril (LOTENSIN) 40 MG tablet Take 1 tablet (40 mg total) by mouth daily. 90 tablet 3  . clopidogrel (PLAVIX) 75 MG tablet Take 1 tablet (75 mg total) by mouth daily. 90 tablet 3  . isosorbide dinitrate (ISORDIL) 10 MG tablet Take 1 tablet (10 mg total)  by mouth 3 (three) times daily. 90 tablet 1  . metoprolol tartrate (LOPRESSOR) 25 MG tablet Take 1.5 tablets (37.5 mg total) by mouth 2 (two) times daily. 270 tablet 3  . Multiple Vitamins-Minerals (MULTIVITAMIN WITH MINERALS) tablet Take 1 tablet by mouth daily.    . nitroGLYCERIN (NITROSTAT) 0.4 MG SL tablet Place 1 tablet (0.4 mg total) under the tongue every 5 (five) minutes as needed for chest pain. 90 tablet 3  . traMADol (ULTRAM) 50 MG tablet Take 1 tablet (50 mg total) by mouth every 6 (six) hours as needed. 40 tablet 0   No current facility-administered medications on file prior to visit.    No Known Allergies   Assessment/Plan:  1. Hypertension - Blood pressure is close to goal of <130/80. Will increase amlodipine to 10mg   daily. Advised patient he can take 2 of his 5mg  tablets to equal 10mg . I will call in Rx for 10mg  tablets. Once he starts those, he will only take 1 tablet daily. Follow up via telephone in 1 month. I have called pt pharmacy and left VM for them to d/c any other strength of amlodipine on file.   Thank you,   , Pharm.D, BCPS, CPP [facility]  [address] [place], [place]  Phone: [phone]; Fax: [phone]

## 2020-08-11 NOTE — Addendum Note (Signed)
Addended by: Malena Peer D on: 08/11/2020 04:16 PM   Modules accepted: Level of Service

## 2020-09-06 ENCOUNTER — Telehealth (INDEPENDENT_AMBULATORY_CARE_PROVIDER_SITE_OTHER): Payer: Self-pay | Admitting: Pharmacist

## 2020-09-06 ENCOUNTER — Other Ambulatory Visit: Payer: Self-pay

## 2020-09-06 DIAGNOSIS — I10 Essential (primary) hypertension: Secondary | ICD-10-CM

## 2020-09-06 MED ORDER — ISOSORBIDE DINITRATE 10 MG PO TABS
10.0000 mg | ORAL_TABLET | Freq: Three times a day (TID) | ORAL | 11 refills | Status: DC
Start: 2020-09-06 — End: 2021-09-19

## 2020-09-06 NOTE — Progress Notes (Signed)
Patient ID: Todd Gonzalez                 DOB: Apr 15, 1956                      MRN: [id number]     HPI: Todd Gonzalez is a 65 y.o. male patient of Dr. Clifton James referred by Ronie Spies, PA to HTN clinic. PMH is significant for CAD s/p ruptured plaque in OM in 2003, NSTEMI with CABG x4v on 01/21/20, HTN, HLD, pre-DM (A1c 6.1 in 01/2020), and 1-39% bilateral carotid artery stenosis. Pt seen in telemedicine HTN clinic on 08/09/20 and reported home BP readings ranged 131-134/80-85. Amlodipine was increased from 5 mg to 10 mg daily.  Patient presents today via telephone in good spirits. Reports medication adherence with all HTN medications. Reports still taking 2 tablets of amlodipine 5 mg daily with plans to start 1 tablet of amlodipine 10 mg daily soon. Denies dizziness, lightheadedness, fatigue, blurred vision, and LE swelling. Reports he hasn't taken his morning meds yet today, but home BP ranges 130-132/75-81. Additionally, reports ability to go some days without smoking (previously 2 cigs/day).  Current HTN meds: benazepril 40mg  daily (AM), metoprolol tartrate 37.5mg  BID, isosorbide dinitrate 10mg  TID, amlodipine 10mg  daily  BP goal: <130/80 mmHg  Family History: Unknown by patient.  Social History: Smokes 2 cigarettes per day (was 1/4 PPD), denies drug use, occasional alcohol use.  Diet: Doesn't add salt to food. Likes boiled plantains and potatoes, corncakes. Drinks water, sometimes fruit juice.  Exercise: Walks 150 yards, to/from bus stop, walks 20 to 25 minutes per day. Went back to work ( )   Home BP readings: 130-132/75-81  Labs: 05/09/20: Na 139, K 4.4, Scr 0.93, GFR 85  Wt Readings from Last 3 Encounters:  03/18/20 153 lb 6.4 oz (69.6 kg)  02/15/20 152 lb (68.9 kg)  02/05/20 152 lb 3.2 oz (69 kg)   BP Readings from Last 3 Encounters:  07/19/20 (!) 150/72  06/21/20 140/82  06/07/20 130/75   Pulse Readings from Last 3 Encounters:  07/19/20 (!) 55   06/21/20 62  05/09/20 79    Renal function: CrCl cannot be calculated (Patient's most recent lab result is older than the maximum 21 days allowed.).  Past Medical History:  Diagnosis Date  . CAD (coronary artery disease)    ruptured plaque in OM 2003, NSTEMI/CABG 01/2020  . Headache(784.0)   . HLD (hyperlipidemia)   . HTN (hypertension)   . Mild carotid artery disease (HCC)   . Pre-diabetes   . RBBB (right bundle branch block)     Current Outpatient Medications on File Prior to Visit  Medication Sig Dispense Refill  . amLODipine (NORVASC) 10 MG tablet Take 1 tablet (10 mg total) by mouth daily. 90 tablet 3  . aspirin EC 81 MG tablet Take 1 tablet (81 mg total) by mouth daily. Swallow whole. 90 tablet 3  . atorvastatin (LIPITOR) 40 MG tablet Take 1 tablet (40 mg total) by mouth daily. 90 tablet 3  . benazepril (LOTENSIN) 40 MG tablet Take 1 tablet (40 mg total) by mouth daily. 90 tablet 3  . clopidogrel (PLAVIX) 75 MG tablet Take 1 tablet (75 mg total) by mouth daily. 90 tablet 3  . isosorbide dinitrate (ISORDIL) 10 MG tablet Take 1 tablet (10 mg total) by mouth 3 (three) times daily. 90 tablet 1  . metoprolol tartrate (LOPRESSOR) 25 MG tablet Take 1.5 tablets (37.5 mg total) by mouth  2 (two) times daily. 270 tablet 3  . Multiple Vitamins-Minerals (MULTIVITAMIN WITH MINERALS) tablet Take 1 tablet by mouth daily.    . nitroGLYCERIN (NITROSTAT) 0.4 MG SL tablet Place 1 tablet (0.4 mg total) under the tongue every 5 (five) minutes as needed for chest pain. 90 tablet 3  . traMADol (ULTRAM) 50 MG tablet Take 1 tablet (50 mg total) by mouth every 6 (six) hours as needed. 40 tablet 0   No current facility-administered medications on file prior to visit.    No Known Allergies   Assessment/Plan:  1. Hypertension - BP near goal <130/80 mmHg. Medication adherence appears optimal. Continued benazepril 40mg  daily, metoprolol tartrate 37.5mg  BID, isosorbide dinitrate 10mg  TID, and  amlodipine 10mg  daily. Resent refills for isosorbide dinitrate 10mg  TID as requested by patient. Encouraged patient to continue checking BP daily at least 1 hour after medications. Congratulated patient on achievement of decreasing the number of cigarettes. Encouraged patient to continue progress in achieving smoking cessation. Patient verbalized understanding. Follow-up appointment via telephone scheduled in 4 weeks.  , PharmD, BCPS PGY2 Ambulatory Care Pharmacy Resident [facility] [address], [place], [place] [facility] Phone: [phone]; Fax: [phone]

## 2020-10-03 NOTE — Progress Notes (Signed)
Patient ID: Todd Gonzalez                 DOB: 1955/08/19                      MRN: [id number]     HPI: Todd Gonzalez a 65 y.o.malepatient of Dr. Clifton James referred by Ronie Spies, PAto HTN clinic. PMH is significant for CAD s/p ruptured plaque in OM in 2003, NSTEMI with CABG x4v on 01/21/20, HTN, HLD, pre-DM (A1c 6.1 in 01/2020), and 1-39% bilateral carotid artery stenosis. Pt seen in telemedicine HTN clinic on 09/06/20 and reported home BP readings ranged 130-132/75-81. No changes made in BP medications.  Patient presents today via telephone in good spirits. Reports medication adherence with all HTN medications. Reports right foot was swollen with no pain (unknown cause), but used a cream with warm compresses and swelling improved. Denies dizziness, headaches, and blurred vision. Reports home BP 123/75, 120/76, 108/74 and HR 85, 83.  Current HTN meds:benazepril 40mg  daily (AM), metoprolol tartrate 37.5mg  BID, isosorbide dinitrate 10mg  TID, amlodipine 10mg  daily  BP goal: <130/80 mmHg  Family History: Unknown by patient.  Social History: Smokes 2 cigarettes per day (was 1/4 PPD), denies drug use, occasional alcohol use.  Diet:Doesn't add salt to food. Likes boiled plantains and potatoes, corncakes. Drinks water, sometimes fruit juice.  Exercise:Walks 150 yards, to/from bus stop, walks 20 to 25 minutes per day. Went back to work )   Home BP readings:  123/75 HR 85 120/76 HR 85 108/74 HR 83  Labs: 05/09/20: Na 139, K 4.4, Scr 0.93, GFR 85  Wt Readings from Last 3 Encounters:  03/18/20 153 lb 6.4 oz (69.6 kg)  02/15/20 152 lb (68.9 kg)  02/05/20 152 lb 3.2 oz (69 kg)   BP Readings from Last 3 Encounters:  07/19/20 (!) 150/72  06/21/20 140/82  06/07/20 130/75   Pulse Readings from Last 3 Encounters:  07/19/20 (!) 55  06/21/20 62  05/09/20 79    Renal function: CrCl cannot be calculated (Patient's most recent lab result is older than the maximum  21 days allowed.).  Past Medical History:  Diagnosis Date  . CAD (coronary artery disease)    ruptured plaque in OM 2003, NSTEMI/CABG 01/2020  . Headache(784.0)   . HLD (hyperlipidemia)   . HTN (hypertension)   . Mild carotid artery disease (HCC)   . Pre-diabetes   . RBBB (right bundle branch block)     Current Outpatient Medications on File Prior to Visit  Medication Sig Dispense Refill  . amLODipine (NORVASC) 10 MG tablet Take 1 tablet (10 mg total) by mouth daily. 90 tablet 3  . aspirin EC 81 MG tablet Take 1 tablet (81 mg total) by mouth daily. Swallow whole. 90 tablet 3  . atorvastatin (LIPITOR) 40 MG tablet Take 1 tablet (40 mg total) by mouth daily. 90 tablet 3  . benazepril (LOTENSIN) 40 MG tablet Take 1 tablet (40 mg total) by mouth daily. 90 tablet 3  . clopidogrel (PLAVIX) 75 MG tablet Take 1 tablet (75 mg total) by mouth daily. 90 tablet 3  . isosorbide dinitrate (ISORDIL) 10 MG tablet Take 1 tablet (10 mg total) by mouth 3 (three) times daily. 90 tablet 11  . metoprolol tartrate (LOPRESSOR) 25 MG tablet Take 1.5 tablets (37.5 mg total) by mouth 2 (two) times daily. 270 tablet 3  . Multiple Vitamins-Minerals (MULTIVITAMIN WITH MINERALS) tablet Take 1 tablet by mouth daily.    05/11/20  nitroGLYCERIN (NITROSTAT) 0.4 MG SL tablet Place 1 tablet (0.4 mg total) under the tongue every 5 (five) minutes as needed for chest pain. 90 tablet 3  . traMADol (ULTRAM) 50 MG tablet Take 1 tablet (50 mg total) by mouth every 6 (six) hours as needed. 40 tablet 0   No current facility-administered medications on file prior to visit.    No Known Allergies   Assessment/Plan:  1. Hypertension - Home BP readings at goal <130/80 mmHg. Medication adherence appears optimal. Continued benazepril 40mg  daily, metoprolol tartrate 37.5mg  BID, isosorbide dinitrate 10mg  TID, and amlodipine 10mg  daily. Advised to contact our office if swelling worsens. Patient verbalized understanding. Virtual follow-up  appointment scheduled in 5 weeks.  , PharmD, BCPS PGY2 Ambulatory Care Pharmacy Resident [facility] [address] [place], [facility] [address] Phone: [phone]; Fax: [phone]

## 2020-10-04 ENCOUNTER — Other Ambulatory Visit: Payer: Self-pay

## 2020-10-04 ENCOUNTER — Telehealth (INDEPENDENT_AMBULATORY_CARE_PROVIDER_SITE_OTHER): Payer: Self-pay | Admitting: Pharmacist

## 2020-10-04 VITALS — BP 123/75 | HR 85

## 2020-10-04 DIAGNOSIS — I10 Essential (primary) hypertension: Secondary | ICD-10-CM

## 2020-11-07 NOTE — Progress Notes (Signed)
Patient ID: Todd Gonzalez                 DOB: 03/16/1956                      MRN: [id number]     HPI: Todd Gonzalez is a 65 y.o. male patient of Dr. Clifton James referred by Ronie Spies, PA to HTN clinic. PMH is significant for CAD s/p ruptured plaque in OM in 2003, NSTEMI with CABG x4v on 01/21/20, HTN, HLD, pre-DM (A1c 6.1 in 01/2020), and 1-39% bilateral carotid artery stenosis. Pt seen in telemedicine HTN clinic on 10/04/20 and reported home BP readings ranged 108-123/74-76. No changes made to BP medications.  Patient presents today via telephone in good spirits. Reports medication adherence with all BP medications. Denies dizziness, headaches, blurred vision, and LE swelling. Reports right foot swelling has resolved. Home BP readings average 120s/70s and HR 70-80s (see below). Reports down to 1-2 cigs/day and sometimes goes 24 hours without smoking.   Current HTN meds: benazepril 40mg  daily (AM), metoprolol tartrate 37.5mg  BID, isosorbide dinitrate 10mg  TID, amlodipine 10mg  daily BP goal: <130/80 mmHg  Family History: Unknown by patient.   Social History: down to 1-2 cigarettes per day (was 1/4 PPD), denies drug use, occasional alcohol use.   Diet: Doesn't add salt to food. Likes boiled plantains and potatoes, corncakes. Drinks water, sometimes fruit juice.   Exercise: Walks 150 yards, to/from bus stop, walks 20 to 25 minutes per day. Went back to work )   Home BP readings:  BP 124/72, 125/77, 123/75, 108/74, 120/76 HR 73, 80, 85, 83, 85  Labs: 05/09/20: Na 139, K 4.4, Scr 0.93, GFR 85  Wt Readings from Last 3 Encounters:  03/18/20 153 lb 6.4 oz (69.6 kg)  02/15/20 152 lb (68.9 kg)  02/05/20 152 lb 3.2 oz (69 kg)   BP Readings from Last 3 Encounters:  10/04/20 123/75  07/19/20 (!) 150/72  06/21/20 140/82   Pulse Readings from Last 3 Encounters:  10/04/20 85  07/19/20 (!) 55  06/21/20 62    Renal function: CrCl cannot be calculated (Patient's most recent  lab result is older than the maximum 21 days allowed.).  Past Medical History:  Diagnosis Date   CAD (coronary artery disease)    ruptured plaque in OM 2003, NSTEMI/CABG 01/2020   Headache(784.0)    HLD (hyperlipidemia)    HTN (hypertension)    Mild carotid artery disease (HCC)    Pre-diabetes    RBBB (right bundle branch block)     Current Outpatient Medications on File Prior to Visit  Medication Sig Dispense Refill   amLODipine (NORVASC) 10 MG tablet Take 1 tablet (10 mg total) by mouth daily. 90 tablet 3   aspirin EC 81 MG tablet Take 1 tablet (81 mg total) by mouth daily. Swallow whole. 90 tablet 3   atorvastatin (LIPITOR) 40 MG tablet Take 1 tablet (40 mg total) by mouth daily. 90 tablet 3   benazepril (LOTENSIN) 40 MG tablet Take 1 tablet (40 mg total) by mouth daily. 90 tablet 3   clopidogrel (PLAVIX) 75 MG tablet Take 1 tablet (75 mg total) by mouth daily. 90 tablet 3   isosorbide dinitrate (ISORDIL) 10 MG tablet Take 1 tablet (10 mg total) by mouth 3 (three) times daily. 90 tablet 11   metoprolol tartrate (LOPRESSOR) 25 MG tablet Take 1.5 tablets (37.5 mg total) by mouth 2 (two) times daily. 270 tablet 3  Multiple Vitamins-Minerals (MULTIVITAMIN WITH MINERALS) tablet Take 1 tablet by mouth daily.     nitroGLYCERIN (NITROSTAT) 0.4 MG SL tablet Place 1 tablet (0.4 mg total) under the tongue every 5 (five) minutes as needed for chest pain. 90 tablet 3   traMADol (ULTRAM) 50 MG tablet Take 1 tablet (50 mg total) by mouth every 6 (six) hours as needed. 40 tablet 0   No current facility-administered medications on file prior to visit.    No Known Allergies  Assessment/Plan:  1. Hypertension - Home BP readings continue to meet goal <130/80 mmHg. Medication adherence appears optimal. Continued benazepril 40mg  daily, metoprolol tartrate 37.5mg  BID, isosorbide dinitrate 10mg  TID, and amlodipine 10mg  daily. Congratulated patient on successfully managing BP and reducing the number of  cigarettes with the goal to quit smoking soon. Encouraged continued medication adherence and to continue monitoring BP. Advised to contact our office with any concerns or questions regarding BP or medications in the future. Patient verbalized understanding.   , PharmD, BCPS PGY2 Ambulatory Care Pharmacy Resident [facility] [address] [place], [facility] [address] Phone: [phone]; Fax: [phone]

## 2020-11-08 ENCOUNTER — Telehealth (INDEPENDENT_AMBULATORY_CARE_PROVIDER_SITE_OTHER): Payer: Self-pay | Admitting: Pharmacist

## 2020-11-08 ENCOUNTER — Other Ambulatory Visit: Payer: Self-pay

## 2020-11-08 VITALS — BP 124/72 | HR 73

## 2020-11-08 DIAGNOSIS — I10 Essential (primary) hypertension: Secondary | ICD-10-CM

## 2021-01-03 ENCOUNTER — Ambulatory Visit: Payer: Self-pay | Admitting: Internal Medicine

## 2021-02-18 ENCOUNTER — Other Ambulatory Visit: Payer: Self-pay | Admitting: Medical

## 2021-03-30 ENCOUNTER — Other Ambulatory Visit: Payer: Self-pay | Admitting: Cardiovascular Disease

## 2021-04-23 NOTE — Progress Notes (Signed)
Cardiology Office Note    Date:  04/24/2021   ID:  Todd Gonzalez, DOB 23-Sep-1955, MRN [id number]  PCP:  Patient, No Pcp Per (Inactive)  Cardiologist:  Verne Carrow, MD  Electrophysiologist:  None   Chief Complaint: f/u CAD  History of Present Illness:   Todd Gonzalez is a 65 y.o. male with history of CAD s/p ruptured plaque in OM in 2003, NSTEMI with CABGx4 on 01/21/20, hypertension, hyperlipidemia, pre-DM, mild carotid artery disease (1-39% bilaterally), chronic right bundle branch block who presents for post-hospital follow-up. He followed with our team remotely then was lost to follow-up. He returned to the hospital in August 2021 with chest pain and NSTEMI, found to have multivessel disease and underwent CABGx4 with LIMA-LAD, RIMA-LPDA, L-radial-sequential-OM1-diagonal on 01/21/20. Preop echo had shown normal LVEF 60-65%, + WMA, grade 1 DD. He was originally thought to be statin intolerant but this was previously a cost issue so atorvastatin was initiated at discharge. He was also started on DAPT given NSTEMI. He also followed with our pharmD HTN clinic after last OV to optimize BP.  He is seen back for follow-up today overall doing well. He walks regularly for exercise. He has no complaints of chest pain, dyspnea, dizziness or any other complaints today. He is tolerating all medicines well. He does indicate he is no longer taking clopidogrel. He thinks he took this for a month after it was prescribed but was not sure (previously rx 1 yr supply on initial prescription 01/2020, then a 30 day supply in 02/2021).   Labwork independently reviewed: 04/2020 BMET wnl K 44, Cr 0.93 03/2020 ALT wnl, LDL 46 02/2020 Hgb 12.8   Past Medical History:  Diagnosis Date   CAD (coronary artery disease)    ruptured plaque in OM 2003, NSTEMI/CABG 01/2020   Headache(784.0)    HLD (hyperlipidemia)    HTN (hypertension)    Mild carotid artery disease (HCC)    Pre-diabetes    RBBB (right  bundle branch block)     Past Surgical History:  Procedure Laterality Date   CORONARY ARTERY BYPASS GRAFT N/A 01/21/2020   Procedure: CORONARY ARTERY BYPASS GRAFTING (CABG) TIMES 4 USING LIMA TO LAD; RIMA TO PDA; RADIAL HARVEST TO DIAG AND RAMUS.;  Surgeon: [name], MD;  Location: [facility] OR;  Service: Open Heart Surgery;  Laterality: N/A;  BILATERAL IMA   LEFT HEART CATH AND CORONARY ANGIOGRAPHY N/A 01/19/2020   Procedure: LEFT HEART CATH AND CORONARY ANGIOGRAPHY;  Surgeon: Marykay Lex, MD;  Location: [facility] INVASIVE CV LAB;  Service: Cardiovascular;  Laterality: N/A;   RADIAL ARTERY HARVEST Left 01/21/2020   Procedure: RADIAL ARTERY HARVEST;  Surgeon: [name], MD;  Location: [facility] OR;  Service: Open Heart Surgery;  Laterality: Left;   TEE WITHOUT CARDIOVERSION N/A 01/21/2020   Procedure: TRANSESOPHAGEAL ECHOCARDIOGRAM (TEE);  Surgeon: [name], MD;  Location[facility] OR;  Service: Open Heart Surgery;  Laterality: N/A;    Current Medications: Current Meds  Medication Sig   amLODipine (NORVASC) 10 MG tablet Take 1 tablet (10 mg total) by mouth daily.   aspirin EC 81 MG tablet Take 1 tablet (81 mg total) by mouth daily. Swallow whole.   atorvastatin (LIPITOR) 40 MG tablet Take 1 tablet (40 mg total) by mouth daily. Please make overdue appt with Dr. Clifton James before anymore refills. Thank you 1st attempt   benazepril (LOTENSIN) 40 MG tablet Take 1 tablet (40 mg total) by mouth daily.   isosorbide dinitrate (ISORDIL)  10 MG tablet Take 1 tablet (10 mg total) by mouth 3 (three) times daily.   metoprolol tartrate (LOPRESSOR) 25 MG tablet Take 1.5 tablets (37.5 mg total) by mouth 2 (two) times daily.   Multiple Vitamins-Minerals (MULTIVITAMIN WITH MINERALS) tablet Take 1 tablet by mouth daily.     Allergies:   Patient has no known allergies.   Social History   Socioeconomic History   Marital status: Married    Spouse name: Not on file   Number of children: Not on file   Years of  education: Not on file   Highest education level: Not on file  Occupational History   Not on file  Tobacco Use   Smoking status: Some Days    Packs/day: 0.25    Types: Cigarettes   Smokeless tobacco: Never  Vaping Use   Vaping Use: Never used  Substance and Sexual Activity   Alcohol use: Yes    Comment: occ   Drug use: No   Sexual activity: Not on file  Other Topics Concern   Not on file  Social History Narrative   Not working; helps out at a [employer].    Lives with friend Todd Gonzalez; has a girlfriend.    Social Determinants of Health   Financial Resource Strain: Not on file  Food Insecurity: Not on file  Transportation Needs: Not on file  Physical Activity: Not on file  Stress: Not on file  Social Connections: Not on file     Family History:  The patient's Family history is unknown by patient.  ROS:   Please see the history of present illness.  All other systems are reviewed and otherwise negative.    EKGs/Labs/Other Studies Reviewed:    Studies reviewed are outlined and summarized above. Reports included below if pertinent.  LHC 01/19/20   Mid LM to Dist LM lesion is 30% stenosed.   Ost Cx to Mid Cx lesion is 70% stenosed with 60% stenosed side branch in 1st Mrg.   CULPRIT LESION mid Cx to Dist Cx lesion is 95% stenosed -was initially 100% occluded, opened up somewhat with injections.   Mid LAD lesion is 70% stenosed with 70% stenosed side branch in 2nd Diag.   Dist LAD lesion is 75% stenosed.   ------------------------------   The left ventricular systolic function is normal. The left ventricular ejection fraction is 55-65% by visual estimate.   LV end diastolic pressure is normal.     SUMMARY   Severe multivessel disease:  ? Diffuse ostial proximal LCx disease involving takeoff of 1st Mrg with initial total occlusion prior to small caliber diffusely diseased 2nd Mrg (now 90% ulcerated) -   Follow-on AV groove LCx gives off 3 additional posterior  lateral branches distally.  ? Diffuse proximal to mid LAD 65% stenosis followed by normalization and then distal-apical tapering to 70% stenosis.  ? Small nondominant RCA   Normal EF and EDP.        RECOMMENDATIONS   Admit overnight, restart IV heparin   CVTS consultation (images reviewed with Dr. Clifton James.  Would be difficult PCI of the LCx with bifurcation lesion and then extensive LAD lesions.   Continue to titrate GUIDELINE DIRECTED MEDICAL THERAPY           Bryan Lemma, MD   2D echo 01/20/20   1. Left ventricular ejection fraction, by estimation, is 60 to 65%. The  left ventricle has normal function. The left ventricle demonstrates  regional wall motion abnormalities (see scoring diagram/findings  for  description). Left ventricular diastolic  parameters are consistent with Grade I diastolic dysfunction (impaired  relaxation). There is mild hypokinesis of the left ventricular, basal  inferolateral wall.   2. Right ventricular systolic function is normal. The right ventricular  size is normal.   3. The mitral valve is normal in structure. No evidence of mitral valve  regurgitation. No evidence of mitral stenosis.   4. The aortic valve is normal in structure. Aortic valve regurgitation is  not visualized. No aortic stenosis is present.   5. The inferior vena cava is normal in size with greater than 50%  respiratory variability, suggesting right atrial pressure of 3 mmHg.     EKG:  EKG is ordered today, personally reviewed, demonstrating NSR 67bpm, RBBB no acute changes  Recent Labs: 05/09/2020: BUN 13; Creatinine, Ser 0.93; Potassium 4.4; Sodium 139  Recent Lipid Panel    Component Value Date/Time   CHOL 136 03/18/2020 1114   TRIG 71 03/18/2020 1114   HDL 76 03/18/2020 1114   CHOLHDL 1.8 03/18/2020 1114   CHOLHDL 3.3 01/20/2020 0737   VLDL 19 01/20/2020 0737   LDLCALC 46 03/18/2020 1114    PHYSICAL EXAM:    VS:  BP 128/72   Pulse 67   Ht 5\' 7"  (1.702 m)    Wt 167 lb (75.8 kg)   SpO2 99%   BMI 26.16 kg/m   BMI: Body mass index is 26.16 kg/m.  GEN: Well nourished, well developed male in no acute distress HEENT: normocephalic, atraumatic Neck: no JVD, carotid bruits, or masses Cardiac: RRR; no murmurs, rubs, or gallops, no edema  Respiratory:  clear to auscultation bilaterally, normal work of breathing GI: soft, nontender, nondistended, + BS MS: no deformity or atrophy Skin: warm and dry, no rash Neuro:  Alert and Oriented x 3, Strength and sensation are intact, follows commands Psych: euthymic mood, full affect  Wt Readings from Last 3 Encounters:  04/24/21 167 lb (75.8 kg)  03/18/20 153 lb 6.4 oz (69.6 kg)  02/15/20 152 lb (68.9 kg)     ASSESSMENT & PLAN:   1. CAD s/p CABG, known RBBB - doing well without recent angina. For unclear reasons he thinks he only took Plavix for 1 month after it was prescribed. He is now >1 year out from CABG/NSTEMI so we will continue aspirin at this time, along with amlodipine, atorvastatin, isordil, metoprolol and benazepril. Update labs today to include CMET, CBC, lipid profile. With regard to chronic RBBB, this appears unchanged from prior and no significant bradycardia noted.  2. Essential HTN - appears controlled on present regimen. Update K/Cr today and otherwise continue meds as outlined. In the future, should we require additional control, would consider changing metoprolol to carvedilol.  3. Hyperlipidemia - in need of updated lipids. Repeat CMET/lipid profile today and continue statin.   4. Mild carotid artery disease by duplex 01/2020 - no bruit heard on exam. No history of TIA/stroke symptoms. Repeat duplex 01/2022. Continue ASA, statin.    Disposition: F/u with Dr. 02/2022 or myself in 1 year.   Medication Adjustments/Labs and Tests Ordered: Current medicines are reviewed at length with the patient today.  Concerns regarding medicines are outlined above. Medication changes, Labs and Tests  ordered today are summarized above and listed in the Patient Instructions accessible in Encounters.   Signed, Clifton James, PA-C  04/24/2021 8:53 AM    [facility] Phone: [phone]; Fax: [phone]

## 2021-04-24 ENCOUNTER — Ambulatory Visit (INDEPENDENT_AMBULATORY_CARE_PROVIDER_SITE_OTHER): Payer: Self-pay | Admitting: Physician Assistant

## 2021-04-24 ENCOUNTER — Encounter: Payer: Self-pay | Admitting: Physician Assistant

## 2021-04-24 ENCOUNTER — Other Ambulatory Visit: Payer: Self-pay

## 2021-04-24 VITALS — BP 128/72 | HR 67 | Ht 67.0 in | Wt 167.0 lb

## 2021-04-24 DIAGNOSIS — I10 Essential (primary) hypertension: Secondary | ICD-10-CM

## 2021-04-24 DIAGNOSIS — E785 Hyperlipidemia, unspecified: Secondary | ICD-10-CM

## 2021-04-24 DIAGNOSIS — I2510 Atherosclerotic heart disease of native coronary artery without angina pectoris: Secondary | ICD-10-CM

## 2021-04-24 DIAGNOSIS — I4510 Unspecified right bundle-branch block: Secondary | ICD-10-CM

## 2021-04-24 DIAGNOSIS — I779 Disorder of arteries and arterioles, unspecified: Secondary | ICD-10-CM

## 2021-04-24 LAB — COMPREHENSIVE METABOLIC PANEL
ALT: 50 IU/L — ABNORMAL HIGH (ref 0–44)
AST: 29 IU/L (ref 0–40)
Albumin/Globulin Ratio: 1.8 (ref 1.2–2.2)
Albumin: 4.1 g/dL (ref 3.8–4.8)
Alkaline Phosphatase: 61 IU/L (ref 44–121)
BUN/Creatinine Ratio: 24 (ref 10–24)
BUN: 21 mg/dL (ref 8–27)
Bilirubin Total: <0.2 mg/dL (ref 0.0–1.2)
CO2: 22 mmol/L (ref 20–29)
Calcium: 9.5 mg/dL (ref 8.6–10.2)
Chloride: 104 mmol/L (ref 96–106)
Creatinine, Ser: 0.88 mg/dL (ref 0.76–1.27)
Globulin, Total: 2.3 g/dL (ref 1.5–4.5)
Glucose: 173 mg/dL — ABNORMAL HIGH (ref 70–99)
Potassium: 5.1 mmol/L (ref 3.5–5.2)
Sodium: 139 mmol/L (ref 134–144)
Total Protein: 6.4 g/dL (ref 6.0–8.5)
eGFR: 94 mL/min/{1.73_m2} (ref >59)

## 2021-04-24 LAB — CBC
Hematocrit: 39.4 % (ref 37.5–51.0)
Hemoglobin: 13.4 g/dL (ref 13.0–17.7)
MCH: 30.2 pg (ref 26.6–33.0)
MCHC: 34 g/dL (ref 31.5–35.7)
MCV: 89 fL (ref 79–97)
Platelets: 283 10*3/uL (ref 150–450)
RBC: 4.44 x10E6/uL (ref 4.14–5.80)
RDW: 13.6 % (ref 11.6–15.4)
WBC: 7.1 10*3/uL (ref 3.4–10.8)

## 2021-04-24 LAB — LIPID PANEL
Chol/HDL Ratio: 2.1 ratio (ref 0.0–5.0)
Cholesterol, Total: 145 mg/dL (ref 100–199)
HDL: 70 mg/dL (ref >39)
LDL Chol Calc (NIH): 60 mg/dL (ref 0–99)
Triglycerides: 75 mg/dL (ref 0–149)
VLDL Cholesterol Cal: 15 mg/dL (ref 5–40)

## 2021-04-24 MED ORDER — ATORVASTATIN CALCIUM 40 MG PO TABS
40.0000 mg | ORAL_TABLET | Freq: Every day | ORAL | 3 refills | Status: DC
Start: 2021-04-24 — End: 2021-10-11

## 2021-04-24 MED ORDER — BENAZEPRIL HCL 40 MG PO TABS
40.0000 mg | ORAL_TABLET | Freq: Every day | ORAL | 3 refills | Status: DC
Start: 2021-04-24 — End: 2021-10-11

## 2021-04-24 NOTE — Patient Instructions (Addendum)
Medication Instructions:  Your physician has recommended you make the following change in your medication:   STOP Plavix  *If you need a refill on your cardiac medications before your next appointment, please call your pharmacy*   Lab Work: TODAY:  CMET, LIPIDS, & CBC  If you have labs (blood work) drawn today and your tests are completely normal, you will receive your results only by: MyChart Message (if you have MyChart) OR A paper copy in the mail If you have any lab test that is abnormal or we need to change your treatment, we will call you to review the results.   Testing/Procedures: Your physician has requested that you have a carotid duplex IN 01/2022.  This test is an ultrasound of the carotid arteries in your neck. It looks at blood flow through these arteries that supply the brain with blood. Allow one hour for this exam. There are no restrictions or special instructions.    Follow-Up: At [facility], you and your health needs are our priority.  As part of our continuing mission to provide you with exceptional heart care, we have created designated Provider Care Teams.  These Care Teams include your primary Cardiologist (physician) and Advanced Practice Providers (APPs -  Physician Assistants and Nurse Practitioners) who all work together to provide you with the care you need, when you need it.  We recommend signing up for the patient portal called "MyChart".  Sign up information is provided on this After Visit Summary.  MyChart is used to connect with patients for Virtual Visits (Telemedicine).  Patients are able to view lab/test results, encounter notes, upcoming appointments, etc.  Non-urgent messages can be sent to your provider as well.   To learn more about what you can do with MyChart, go to [email or web address].    Your next appointment:   12 month(s)  The format for your next appointment:   In Person  Provider:   Verne Carrow, MD  or Ronie Spies, PA-C          Other Instructions

## 2021-05-13 ENCOUNTER — Other Ambulatory Visit: Payer: Self-pay | Admitting: Cardiovascular Disease

## 2021-09-19 ENCOUNTER — Other Ambulatory Visit: Payer: Self-pay | Admitting: Cardiovascular Disease

## 2021-10-10 ENCOUNTER — Emergency Department (HOSPITAL_COMMUNITY): Payer: Medicaid Other

## 2021-10-10 ENCOUNTER — Encounter (HOSPITAL_COMMUNITY): Payer: Self-pay

## 2021-10-10 ENCOUNTER — Other Ambulatory Visit: Payer: Self-pay

## 2021-10-10 ENCOUNTER — Inpatient Hospital Stay (HOSPITAL_COMMUNITY)
Admission: EM | Admit: 2021-10-10 | Discharge: 2021-10-11 | DRG: 638 | Disposition: A | Discharge status: Home / Self Care | Payer: Medicaid Other | Attending: Internal Medicine | Admitting: Internal Medicine

## 2021-10-10 DIAGNOSIS — Z79899 Other long term (current) drug therapy: Secondary | ICD-10-CM

## 2021-10-10 DIAGNOSIS — K5792 Diverticulitis of intestine, part unspecified, without perforation or abscess without bleeding: Principal | ICD-10-CM

## 2021-10-10 DIAGNOSIS — E1100 Type 2 diabetes mellitus with hyperosmolarity without nonketotic hyperglycemic-hyperosmolar coma (NKHHC): Principal | ICD-10-CM | POA: Diagnosis present

## 2021-10-10 DIAGNOSIS — E871 Hypo-osmolality and hyponatremia: Secondary | ICD-10-CM | POA: Diagnosis present

## 2021-10-10 DIAGNOSIS — Z951 Presence of aortocoronary bypass graft: Secondary | ICD-10-CM

## 2021-10-10 DIAGNOSIS — K5732 Diverticulitis of large intestine without perforation or abscess without bleeding: Secondary | ICD-10-CM | POA: Diagnosis present

## 2021-10-10 DIAGNOSIS — I2510 Atherosclerotic heart disease of native coronary artery without angina pectoris: Secondary | ICD-10-CM | POA: Diagnosis present

## 2021-10-10 DIAGNOSIS — Z20822 Contact with and (suspected) exposure to covid-19: Secondary | ICD-10-CM | POA: Diagnosis present

## 2021-10-10 DIAGNOSIS — N179 Acute kidney failure, unspecified: Secondary | ICD-10-CM | POA: Diagnosis present

## 2021-10-10 DIAGNOSIS — Z7982 Long term (current) use of aspirin: Secondary | ICD-10-CM

## 2021-10-10 DIAGNOSIS — E785 Hyperlipidemia, unspecified: Secondary | ICD-10-CM | POA: Diagnosis present

## 2021-10-10 DIAGNOSIS — E860 Dehydration: Secondary | ICD-10-CM | POA: Diagnosis present

## 2021-10-10 DIAGNOSIS — I252 Old myocardial infarction: Secondary | ICD-10-CM | POA: Diagnosis not present

## 2021-10-10 DIAGNOSIS — I10 Essential (primary) hypertension: Secondary | ICD-10-CM | POA: Diagnosis present

## 2021-10-10 DIAGNOSIS — F17210 Nicotine dependence, cigarettes, uncomplicated: Secondary | ICD-10-CM | POA: Diagnosis present

## 2021-10-10 LAB — I-STAT VENOUS BLOOD GAS, ED
Acid-base deficit: 1 mmol/L (ref 0.0–2.0)
Bicarbonate: 24.2 mmol/L (ref 20.0–28.0)
Calcium, Ion: 1.17 mmol/L (ref 1.15–1.40)
HCT: 36 % — ABNORMAL LOW (ref 39.0–52.0)
Hemoglobin: 12.2 g/dL — ABNORMAL LOW (ref 13.0–17.0)
O2 Saturation: 89 %
Potassium: 5.2 mmol/L — ABNORMAL HIGH (ref 3.5–5.1)
Sodium: 131 mmol/L — ABNORMAL LOW (ref 135–145)
TCO2: 25 mmol/L (ref 22–32)
pCO2, Ven: 40.6 mmHg — ABNORMAL LOW (ref 44–60)
pH, Ven: 7.384 (ref 7.25–7.43)
pO2, Ven: 57 mmHg — ABNORMAL HIGH (ref 32–45)

## 2021-10-10 LAB — RESP PANEL BY RT-PCR (FLU A&B, COVID) ARPGX2
Influenza A by PCR: NEGATIVE
Influenza B by PCR: NEGATIVE
SARS Coronavirus 2 by RT PCR: NEGATIVE

## 2021-10-10 LAB — URINALYSIS, ROUTINE W REFLEX MICROSCOPIC
Bacteria, UA: NONE SEEN
Bilirubin Urine: NEGATIVE
Glucose, UA: >=500 mg/dL — AB
Hgb urine dipstick: NEGATIVE
Ketones, ur: NEGATIVE mg/dL
Leukocytes,Ua: NEGATIVE
Nitrite: NEGATIVE
Protein, ur: NEGATIVE mg/dL
Specific Gravity, Urine: 1.02 (ref 1.005–1.030)
pH: 5 (ref 5.0–8.0)

## 2021-10-10 LAB — COMPREHENSIVE METABOLIC PANEL
ALT: 46 U/L — ABNORMAL HIGH (ref 0–44)
AST: 26 U/L (ref 15–41)
Albumin: 2.8 g/dL — ABNORMAL LOW (ref 3.5–5.0)
Alkaline Phosphatase: 60 U/L (ref 38–126)
Anion gap: 9 (ref 5–15)
BUN: 38 mg/dL — ABNORMAL HIGH (ref 8–23)
CO2: 21 mmol/L — ABNORMAL LOW (ref 22–32)
Calcium: 8.4 mg/dL — ABNORMAL LOW (ref 8.9–10.3)
Chloride: 97 mmol/L — ABNORMAL LOW (ref 98–111)
Creatinine, Ser: 1.71 mg/dL — ABNORMAL HIGH (ref 0.61–1.24)
GFR, Estimated: 43 mL/min — ABNORMAL LOW (ref >60)
Glucose, Bld: 862 mg/dL (ref 70–99)
Potassium: 5.1 mmol/L (ref 3.5–5.1)
Sodium: 127 mmol/L — ABNORMAL LOW (ref 135–145)
Total Bilirubin: 0.5 mg/dL (ref 0.3–1.2)
Total Protein: 5.6 g/dL — ABNORMAL LOW (ref 6.5–8.1)

## 2021-10-10 LAB — CBC WITH DIFFERENTIAL/PLATELET
Abs Immature Granulocytes: 0.05 10*3/uL (ref 0.00–0.07)
Basophils Absolute: 0.1 10*3/uL (ref 0.0–0.1)
Basophils Relative: 1 %
Eosinophils Absolute: 0.1 10*3/uL (ref 0.0–0.5)
Eosinophils Relative: 1 %
HCT: 35.3 % — ABNORMAL LOW (ref 39.0–52.0)
Hemoglobin: 11.9 g/dL — ABNORMAL LOW (ref 13.0–17.0)
Immature Granulocytes: 1 %
Lymphocytes Relative: 31 %
Lymphs Abs: 2.2 10*3/uL (ref 0.7–4.0)
MCH: 29.5 pg (ref 26.0–34.0)
MCHC: 33.7 g/dL (ref 30.0–36.0)
MCV: 87.6 fL (ref 80.0–100.0)
Monocytes Absolute: 0.6 10*3/uL (ref 0.1–1.0)
Monocytes Relative: 8 %
Neutro Abs: 4.2 10*3/uL (ref 1.7–7.7)
Neutrophils Relative %: 58 %
Platelets: 223 10*3/uL (ref 150–400)
RBC: 4.03 MIL/uL — ABNORMAL LOW (ref 4.22–5.81)
RDW: 14.9 % (ref 11.5–15.5)
WBC: 7.2 10*3/uL (ref 4.0–10.5)
nRBC: 0 % (ref 0.0–0.2)

## 2021-10-10 LAB — GLUCOSE, CAPILLARY
Glucose-Capillary: 168 mg/dL — ABNORMAL HIGH (ref 70–99)
Glucose-Capillary: 163 mg/dL — ABNORMAL HIGH (ref 70–99)
Glucose-Capillary: 273 mg/dL — ABNORMAL HIGH (ref 70–99)
Glucose-Capillary: 204 mg/dL — ABNORMAL HIGH (ref 70–99)

## 2021-10-10 LAB — CBG MONITORING, ED
Glucose-Capillary: >600 mg/dL (ref 70–99)
Glucose-Capillary: >600 mg/dL (ref 70–99)
Glucose-Capillary: 371 mg/dL — ABNORMAL HIGH (ref 70–99)
Glucose-Capillary: 492 mg/dL — ABNORMAL HIGH (ref 70–99)
Glucose-Capillary: 292 mg/dL — ABNORMAL HIGH (ref 70–99)
Glucose-Capillary: 196 mg/dL — ABNORMAL HIGH (ref 70–99)

## 2021-10-10 LAB — LIPASE, BLOOD: Lipase: 47 U/L (ref 11–51)

## 2021-10-10 LAB — OSMOLALITY: Osmolality: 324 mOsm/kg (ref 275–295)

## 2021-10-10 LAB — HEMOGLOBIN A1C
Hgb A1c MFr Bld: 11.7 % — ABNORMAL HIGH (ref 4.8–5.6)
Mean Plasma Glucose: 289.09 mg/dL

## 2021-10-10 LAB — BETA-HYDROXYBUTYRIC ACID: Beta-Hydroxybutyric Acid: 0.11 mmol/L (ref 0.05–0.27)

## 2021-10-10 MED ORDER — INSULIN GLARGINE-YFGN 100 UNIT/ML ~~LOC~~ SOLN
5.0000 [IU] | Freq: Every day | SUBCUTANEOUS | Status: DC
  Administered 2021-10-10: 5 [IU] via SUBCUTANEOUS
  Filled 2021-10-10 (×2): qty 0.05

## 2021-10-10 MED ORDER — INSULIN REGULAR(HUMAN) IN NACL 100-0.9 UT/100ML-% IV SOLN: INTRAVENOUS | Status: AC

## 2021-10-10 MED ORDER — ENOXAPARIN SODIUM 40 MG/0.4ML IJ SOSY
40.0000 mg | PREFILLED_SYRINGE | INTRAMUSCULAR | Status: DC
  Administered 2021-10-10: 40 mg via SUBCUTANEOUS
  Filled 2021-10-10: qty 0.4

## 2021-10-10 MED ORDER — ATORVASTATIN CALCIUM 40 MG PO TABS
40.0000 mg | ORAL_TABLET | Freq: Every day | ORAL | Status: DC
  Administered 2021-10-11: 40 mg via ORAL
  Filled 2021-10-10: qty 1

## 2021-10-10 MED ORDER — AMLODIPINE BESYLATE 10 MG PO TABS: 10.0000 mg | ORAL_TABLET | Freq: Every day | ORAL | Status: DC

## 2021-10-10 MED ORDER — ASPIRIN 81 MG PO TBEC
81.0000 mg | DELAYED_RELEASE_TABLET | Freq: Every day | ORAL | Status: DC
  Administered 2021-10-11: 81 mg via ORAL
  Filled 2021-10-10: qty 1

## 2021-10-10 MED ORDER — DEXTROSE IN LACTATED RINGERS 5 % IV SOLN: INTRAVENOUS | Status: AC

## 2021-10-10 MED ORDER — DEXTROSE IN LACTATED RINGERS 5 % IV SOLN: INTRAVENOUS | Status: DC

## 2021-10-10 MED ORDER — ACETAMINOPHEN 650 MG RE SUPP
650.0000 mg | Freq: Four times a day (QID) | RECTAL | Status: DC | PRN
Start: 2021-10-10 — End: 2021-10-11

## 2021-10-10 MED ORDER — ALBUTEROL SULFATE (2.5 MG/3ML) 0.083% IN NEBU: 2.5000 mg | INHALATION_SOLUTION | Freq: Four times a day (QID) | RESPIRATORY_TRACT | Status: DC | PRN

## 2021-10-10 MED ORDER — ONDANSETRON HCL 4 MG PO TABS
4.0000 mg | ORAL_TABLET | Freq: Four times a day (QID) | ORAL | Status: DC | PRN
Start: 2021-10-10 — End: 2021-10-11

## 2021-10-10 MED ORDER — INSULIN ASPART 100 UNIT/ML IJ SOLN
0.0000 [IU] | Freq: Every day | INTRAMUSCULAR | Status: DC
  Administered 2021-10-10: 2 [IU] via SUBCUTANEOUS

## 2021-10-10 MED ORDER — INSULIN ASPART 100 UNIT/ML IJ SOLN
0.0000 [IU] | Freq: Three times a day (TID) | INTRAMUSCULAR | Status: DC
  Administered 2021-10-11: 2 [IU] via SUBCUTANEOUS

## 2021-10-10 MED ORDER — METOPROLOL TARTRATE 25 MG PO TABS: 37.5000 mg | ORAL_TABLET | Freq: Two times a day (BID) | ORAL | Status: DC

## 2021-10-10 MED ORDER — LACTATED RINGERS IV SOLN: INTRAVENOUS | Status: DC

## 2021-10-10 MED ORDER — INSULIN REGULAR(HUMAN) IN NACL 100-0.9 UT/100ML-% IV SOLN
INTRAVENOUS | Status: DC
  Administered 2021-10-10: 5 [IU]/h via INTRAVENOUS
  Filled 2021-10-10: qty 100

## 2021-10-10 MED ORDER — DEXTROSE 50 % IV SOLN: 0.0000 mL | INTRAVENOUS | Status: DC | PRN

## 2021-10-10 MED ORDER — AMOXICILLIN-POT CLAVULANATE 875-125 MG PO TABS
1.0000 | ORAL_TABLET | Freq: Once | ORAL | Status: AC
  Administered 2021-10-10: 1 via ORAL
  Filled 2021-10-10: qty 1

## 2021-10-10 MED ORDER — AMOXICILLIN-POT CLAVULANATE 875-125 MG PO TABS
1.0000 | ORAL_TABLET | Freq: Two times a day (BID) | ORAL | Status: DC
  Administered 2021-10-10 – 2021-10-11 (×2): 1 via ORAL
  Filled 2021-10-10 (×2): qty 1

## 2021-10-10 MED ORDER — ISOSORBIDE DINITRATE 10 MG PO TABS
10.0000 mg | ORAL_TABLET | Freq: Three times a day (TID) | ORAL | Status: DC
  Administered 2021-10-11: 10 mg via ORAL
  Filled 2021-10-10 (×3): qty 1

## 2021-10-10 MED ORDER — SODIUM CHLORIDE 0.9 % IV BOLUS
500.0000 mL | Freq: Once | INTRAVENOUS | Status: AC
  Administered 2021-10-10: 500 mL via INTRAVENOUS

## 2021-10-10 MED ORDER — SODIUM CHLORIDE 0.9% FLUSH
3.0000 mL | Freq: Two times a day (BID) | INTRAVENOUS | Status: DC
  Administered 2021-10-10 – 2021-10-11 (×2): 3 mL via INTRAVENOUS

## 2021-10-10 MED ORDER — ONDANSETRON HCL 4 MG/2ML IJ SOLN: 4.0000 mg | Freq: Four times a day (QID) | INTRAMUSCULAR | Status: DC | PRN

## 2021-10-10 MED ORDER — HYDROCODONE-ACETAMINOPHEN 5-325 MG PO TABS: 1.0000 | ORAL_TABLET | Freq: Four times a day (QID) | ORAL | Status: DC | PRN

## 2021-10-10 MED ORDER — LACTATED RINGERS IV SOLN: INTRAVENOUS | Status: AC

## 2021-10-10 MED ORDER — IOHEXOL 300 MG/ML  SOLN
100.0000 mL | Freq: Once | INTRAMUSCULAR | Status: AC | PRN
Start: 2021-10-10 — End: 2021-10-10
  Administered 2021-10-10: 90 mL via INTRAVENOUS

## 2021-10-10 MED ORDER — ACETAMINOPHEN 325 MG PO TABS: 650.0000 mg | ORAL_TABLET | Freq: Four times a day (QID) | ORAL | Status: DC | PRN

## 2021-10-10 MED ORDER — SODIUM CHLORIDE 0.9 % IV BOLUS
1000.0000 mL | Freq: Once | INTRAVENOUS | Status: AC
  Administered 2021-10-10: 1000 mL via INTRAVENOUS

## 2021-10-10 NOTE — Progress Notes (Addendum)
Inpatient Diabetes Program Recommendations  AACE/ADA: New Consensus Statement on Inpatient Glycemic Control (2015)  Target Ranges:  Prepandial:   less than 140 mg/dL      Peak postprandial:   less than 180 mg/dL (1-2 hours)      Critically ill patients:  140 - 180 mg/dL   Lab Results  Component Value Date   GLUCAP 292 (H) 10/10/2021   HGBA1C 11.7 (H) 10/10/2021    Review of Glycemic Control  Latest Reference Range & Units 10/10/21 09:25  Glucose 70 - 99 mg/dL 846 (HH)  (HH): Data is critically high  Diabetes history: New DM Outpatient Diabetes medications: None Current orders for Inpatient glycemic control: IV insulin  Referral received for new onset DM.  Spoke with patient at bedside in ED024 about new diagnosis. Discussed basic pathophysiology of DM Type 2, basic home care, basic diabetes diet nutrition principles, importance of checking CBGs and maintaining good CBG control to prevent long-term and short-term complications. Reviewed signs and symptoms of hyperglycemia.  Also reviewed blood sugar goals at home.  RNs to provide ongoing basic DM education at bedside with this patient.  Educated on The Plate Method, CHO's, portion control, CBGs at home fasting and mid afternoon, F/U with PCP every 3 months, long and short term complications of uncontrolled BG, and importance of exercise.  He does not eat red meat or pork.  He drinks mainly water and ginger tea which he makes with hot water, ginger and honey.  Explained that honey does contain sugar and it would be best to limit it to a very small amount.    He is concerned because he needs to be at work soon and states he cannot stay here in the hospital.  Advised him to call his work and let them know he is here in the hospital.  We can provide him with a note.  Explained risks of elevated glucose if he leaves.  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  He is uninsured and does not have a PCP.  [facility] consult  for PCP & medication assistance.  Will also provide him with a glucometer.    Will need affordable insulin at discharge such as 70/30.  Will follow up with him tomorrow.    Addendum@15 :18:  A1C -11.7% (average glucose 289 mg/dL)  When MD is ready to transition to SQ insulin, might consider:  -Semglee 8 units (0.1 units/kg) 2 hrs prior to stopping IV insulin -Novolog 0-9 units TID, 0-5 units QHS -Carb modified diet  Will continue to follow while inpatient.  Thank you, Dulce Sellar, MSN, CDCES Diabetes Coordinator Inpatient Diabetes Program [phone] (team pager from 8a-5p)

## 2021-10-10 NOTE — ED Notes (Signed)
Diabetes coordinator at bedside. 

## 2021-10-10 NOTE — ED Provider Notes (Signed)
[facility] EMERGENCY DEPARTMENT Provider Note   CSN: [id number] Arrival date & time: 10/10/21  4098     History  Chief Complaint  Patient presents with   Hypotension    Todd Gonzalez is a 66 y.o. male.  Patient is a 66 year old male with a history of coronary artery disease status post bypass surgery after an NSTEMI in 2021, hypertension, hyperlipidemia, prediabetes who presents with fatigue and decreased appetite.  He says over the last week he has had increased fatigue.  He has had some lightheadedness on standing.  He says it happens if he stands up too fast.  He is also had some pain in his right abdomen.  He says its not very bad its been constant since last week and has had decreased appetite.  No nausea or vomiting.  No fevers.  No urinary symptoms.  No change in stools.  No chest pain or shortness of breath.  No palpitations.  He went to his cardiologist office this morning but he did not have appointment and they recommended he come to the emergency room.  He has not had any recent changes in his medications.  No known fevers.  He has had a little bit of cough and rhinorrhea.      Home Medications Prior to Admission medications   Medication Sig Start Date End Date Taking? Authorizing Provider  amLODipine (NORVASC) 10 MG tablet Take 1 tablet by mouth once daily 09/19/21   Kathleene Hazel, MD  aspirin EC 81 MG tablet Take 1 tablet (81 mg total) by mouth daily. Swallow whole. 02/05/20   Furth, Cadence H, PA-C  atorvastatin (LIPITOR) 40 MG tablet Take 1 tablet (40 mg total) by mouth daily. 04/24/21   Dunn, Tacey Ruiz, PA-C  benazepril (LOTENSIN) 40 MG tablet Take 1 tablet (40 mg total) by mouth daily. 04/24/21   Dunn, Tacey Ruiz, PA-C  isosorbide dinitrate (ISORDIL) 10 MG tablet Take 1 tablet (10 mg total) by mouth 3 (three) times daily. 09/19/21   Kathleene Hazel, MD  metoprolol tartrate (LOPRESSOR) 25 MG tablet TAKE 1 & 1/2 (ONE & ONE-HALF) TABLETS BY  MOUTH TWICE DAILY 05/16/21   Kathleene Hazel, MD  Multiple Vitamins-Minerals (MULTIVITAMIN WITH MINERALS) tablet Take 1 tablet by mouth daily.    [provider]  nitroGLYCERIN (NITROSTAT) 0.4 MG SL tablet Place 1 tablet (0.4 mg total) under the tongue every 5 (five) minutes as needed for chest pain. 03/23/20 06/21/20  Laurann Montana, PA-C      Allergies    Patient has no known allergies.    Review of Systems   Review of Systems  Constitutional:  Positive for appetite change and fatigue. Negative for chills, diaphoresis and fever.  HENT:  Positive for rhinorrhea. Negative for congestion and sneezing.   Eyes: Negative.   Respiratory:  Positive for cough. Negative for chest tightness and shortness of breath.   Cardiovascular:  Negative for chest pain and leg swelling.  Gastrointestinal:  Positive for abdominal pain. Negative for blood in stool, diarrhea, nausea and vomiting.  Genitourinary:  Negative for difficulty urinating, flank pain, frequency and hematuria.  Musculoskeletal:  Negative for arthralgias and back pain.  Skin:  Negative for rash.  Neurological:  Positive for weakness (Generalized) and light-headedness. Negative for dizziness, speech difficulty, numbness and headaches.   Physical Exam Updated Vital Signs BP 122/64   Pulse 63   Temp 98.1 F (36.7 C) (Oral)   Resp 19   Ht  (  1.702 m)   Wt 80.7 kg   SpO2 99%   BMI 27.88 kg/m  Physical Exam Constitutional:      Appearance: He is well-developed.  HENT:     Head: Normocephalic and atraumatic.  Eyes:     Pupils: Pupils are equal, round, and reactive to light.  Cardiovascular:     Rate and Rhythm: Normal rate and regular rhythm.     Heart sounds: Normal heart sounds.  Pulmonary:     Effort: Pulmonary effort is normal. No respiratory distress.     Breath sounds: Normal breath sounds. No wheezing or rales.  Chest:     Chest wall: No tenderness.  Abdominal:     General: Bowel sounds are normal.      Palpations: Abdomen is soft.     Tenderness: There is abdominal tenderness (Tenderness to the right mid abdomen). There is no guarding or rebound.  Musculoskeletal:        General: Normal range of motion.     Cervical back: Normal range of motion and neck supple.  Lymphadenopathy:     Cervical: No cervical adenopathy.  Skin:    General: Skin is warm and dry.     Findings: No rash.  Neurological:     General: No focal deficit present.     Mental Status: He is alert and oriented to person, place, and time.    ED Results / Procedures / Treatments   Labs (all labs ordered are listed, but only abnormal results are displayed) Labs Reviewed  COMPREHENSIVE METABOLIC PANEL - Abnormal; Notable for the following components:      Result Value   Sodium 127 (*)    Chloride 97 (*)    CO2 21 (*)    Glucose, Bld 862 (*)    BUN 38 (*)    Creatinine, Ser 1.71 (*)    Calcium 8.4 (*)    Total Protein 5.6 (*)    Albumin 2.8 (*)    ALT 46 (*)    GFR, Estimated 43 (*)    All other components within normal limits  CBC WITH DIFFERENTIAL/PLATELET - Abnormal; Notable for the following components:   RBC 4.03 (*)    Hemoglobin 11.9 (*)    HCT 35.3 (*)    All other components within normal limits  URINALYSIS, ROUTINE W REFLEX MICROSCOPIC - Abnormal; Notable for the following components:   Color, Urine STRAW (*)    Glucose, UA >=500 (*)    All other components within normal limits  OSMOLALITY - Abnormal; Notable for the following components:   Osmolality 324 (*)    All other components within normal limits  CBG MONITORING, ED - Abnormal; Notable for the following components:   Glucose-Capillary >600 (*)    All other components within normal limits  I-STAT VENOUS BLOOD GAS, ED - Abnormal; Notable for the following components:   pCO2, Ven 40.6 (*)    pO2, Ven 57 (*)    Sodium 131 (*)    Potassium 5.2 (*)    HCT 36.0 (*)    Hemoglobin 12.2 (*)    All other components within normal limits  CBG  MONITORING, ED - Abnormal; Notable for the following components:   Glucose-Capillary >600 (*)    All other components within normal limits  CBG MONITORING, ED - Abnormal; Notable for the following components:   Glucose-Capillary 492 (*)    All other components within normal limits  CBG MONITORING, ED - Abnormal; Notable for the following components:  Glucose-Capillary 371 (*)    All other components within normal limits  RESP PANEL BY RT-PCR (FLU A&B, COVID) ARPGX2  LIPASE, BLOOD  BETA-HYDROXYBUTYRIC ACID    EKG EKG Interpretation  Date/Time:  Tuesday Oct 10 2021 08:50:38 EDT Ventricular Rate:  79 PR Interval:  129 QRS Duration: 113 QT Interval:  367 QTC Calculation: 421 R Axis:   -77 Text Interpretation: Sinus rhythm Probable left atrial enlargement Incomplete RBBB and LAFB since last tracing no significant change Confirmed by Rolan BuccoBelfi, [place] Carreno [phone]([id number]) on 10/10/2021 9:18:35 AM  Radiology DG Chest 2 View  Result Date: 10/10/2021 CLINICAL DATA:  Cough. EXAM: CHEST - 2 VIEW COMPARISON:  Chest radiograph 02/01/2020. FINDINGS: No consolidation. No visible pleural effusions or pneumothorax. Similar cardiomediastinal silhouette. Median sternotomy. Extensive degenerative change of the shoulders with heterotopic ossification bilaterally, chronic. IMPRESSION: No evidence of acute cardiopulmonary disease. Electronically Signed   By: Feliberto HartsFrederick S Jones M.D.   On: 10/10/2021 09:25   CT Abdomen Pelvis W Contrast  Result Date: 10/10/2021 CLINICAL DATA:  Right-sided abdominal pain. EXAM: CT ABDOMEN AND PELVIS WITH CONTRAST TECHNIQUE: Multidetector CT imaging of the abdomen and pelvis was performed using the standard protocol following bolus administration of intravenous contrast. RADIATION DOSE REDUCTION: This exam was performed according to the departmental dose-optimization program which includes automated exposure control, adjustment of the mA and/or kV according to patient size and/or use of  iterative reconstruction technique. CONTRAST:  90mL OMNIPAQUE IOHEXOL 300 MG/ML  SOLN COMPARISON:  None Available. FINDINGS: Lower chest: No acute abnormality. Hepatobiliary: No focal liver abnormality is seen. No gallstones, gallbladder wall thickening, or biliary dilatation. Pancreas: Unremarkable. No pancreatic ductal dilatation or surrounding inflammatory changes. Spleen: Calcification along the lateral aspect of the spleen, likely sequela of prior traumatic injury. Adrenals/Urinary Tract: Adrenal glands are unremarkable. Kidneys are normal, without renal calculi, focal lesion, or hydronephrosis. Bladder is unremarkable. Stomach/Bowel: Distal esophagus and stomach are unremarkable. Small bowel loops are normal in caliber. Normal appendix. There are numerous diverticula throughout the colon prominent in the cecum and ascending colon. There is focal fatty infiltration adjacent to the cecum concerning for acute diverticulitis (series 3, image 50). No extraluminal free air or abdominal collection/abscess. Vascular/Lymphatic: Aortic atherosclerosis. No enlarged abdominal or pelvic lymph nodes. Reproductive: Prostate is unremarkable. Other: There are fat containing bilateral inguinal hernias, right greater than the left. The fatty infiltration insert the hernial sac. Musculoskeletal: Degenerate disc disease of the lumbar spine. No acute osseous abnormality. IMPRESSION: 1. Numerous colonic diverticula, prominent in the cecum with mild adjacent fat stranding concerning for cecal diverticulitis. 2. No evidence of extraluminal free air or abdominal collection/abscess. 3.  Normal appendix. 4. Bilateral fat containing inguinal hernias, right greater than the left. 5.  Additional chronic findings as above. Electronically Signed   By: Larose HiresImran  Ahmed D.O.   On: 10/10/2021 12:31    Procedures Procedures    Medications Ordered in ED Medications  insulin regular, human (MYXREDLIN) 100 units/ 100 mL infusion (1.4 Units/hr  Intravenous Infusion Verify 10/10/21 1339)  lactated ringers infusion ( Intravenous New Bag/Given 10/10/21 1226)  dextrose 5 % in lactated ringers infusion (0 mLs Intravenous Hold 10/10/21 1200)  dextrose 50 % solution 0-50 mL (has no administration in time range)  amoxicillin-clavulanate (AUGMENTIN) 875-125 MG per tablet 1 tablet (has no administration in time range)  sodium chloride 0.9 % bolus 500 mL (0 mLs Intravenous Stopped 10/10/21 1200)  sodium chloride 0.9 % bolus 1,000 mL (1,000 mLs Intravenous New Bag/Given 10/10/21 1228)  iohexol (OMNIPAQUE) 300  MG/ML solution 100 mL (90 mLs Intravenous Contrast Given 10/10/21 1207)    ED Course/ Medical Decision Making/ A&P                           Medical Decision Making Amount and/or Complexity of Data Reviewed Labs: ordered. Radiology: ordered.  Risk Prescription drug management. Decision regarding hospitalization.   Patient is a 66 year old male who presents with dizziness and fatigue.  He also has some mild abdominal pain.  No fevers.  His white count is normal.  His glucose is noted to be markedly elevated at over 800.  He also has an elevated creatinine consistent with some decreased renal function.  He does not have suggestions of DKA.  His serum osmolality was elevated.  He was started on Endo tool, including IV fluids and insulin.  His glucose is slowly improving with this.  He had a chest x-ray two-view which showed no acute abnormality.  No evidence of pneumonia.  This was interpreted by me and confirmed by the radiologist.  He did have a CT scan of his abdomen pelvis due to his abdominal pain.  There is evidence of diverticulitis.  He was given dose of Augmentin for this.  No complicating features.  I spoke with Dr. Katrinka Blazing he will admit the patient for further treatment.  CRITICAL CARE Performed by: Rolan Bucco Total critical care time: 60 minutes Critical care time was exclusive of separately billable procedures and treating other  patients. Critical care was necessary to treat or prevent imminent or life-threatening deterioration. Critical care was time spent personally by me on the following activities: development of treatment plan with patient and/or surrogate as well as nursing, discussions with consultants, evaluation of patient's response to treatment, examination of patient, obtaining history from patient or surrogate, ordering and performing treatments and interventions, ordering and review of laboratory studies, ordering and review of radiographic studies, pulse oximetry and re-evaluation of patient's condition.   Final Clinical Impression(s) / ED Diagnoses Final diagnoses:  Diverticulitis  Hyperosmolar hyperglycemic state (HHS) (HCC)  AKI (acute kidney injury) [facility])    Rx / DC Orders ED Discharge Orders     None         Rolan Bucco, MD 10/10/21 1339

## 2021-10-10 NOTE — ED Notes (Signed)
Pt notified of need for urine specimen. Urinal at bedside

## 2021-10-10 NOTE — ED Notes (Signed)
Patient transported to CT 

## 2021-10-10 NOTE — H&P (Signed)
History and Physical    Patient: Todd MessingJonathan K Gonzalez UEA:[id number] DOB: Nov 05, 1955 DOA: 10/10/2021 DOS: the patient was seen and examined on 10/10/2021 PCP: Patient, No Pcp Per (Inactive)  Patient coming from: Sent from cardiologist office  Chief Complaint:  Chief Complaint  Patient presents with   Hypotension   HPI: Todd MessingJonathan K Gonzalez is a 66 y.o. male with medical history significant of hypertension, hyperlipidemia, CAD s/p CABG, and prediabetes who presents after being noted to have low blood pressures from his cardiologist office.  For the last week he reports feeling weak and fatigued.  He reports having lightheadedness where he feels like he could possibly pass out if he stands up too fast.  He also reported having constant in the right lower quadrant of his abdomen.  Denies having any significant nausea or vomiting.  He has had increased urinary frequency, increased thirst, and intermittent cough, and sinus drainage.  Denies having any significant fever, chills, chest pain, shortness of breath, dysuria, or recent sick contacts to his knowledge.  He had also recently been on prednisolone 15 mg mouth twice daily as needed for knee pain.  Upon admission into the emergency department patient was seen to be afebrile, pulse 57-79, blood pressure 92/57-127/78, and all other vital signs maintained.  Labs significant for hemoglobin 11.9, sodium 127, CO2 21, creatinine 38, creatinine 1.71, glucose 62, anion gap 9, albumin 2.8, AST 26,   ALT 46, and serum osmolarity 324.  Venous pH was 7.384.  Influenza and COVID-19 screening were negative.  Urinalysis was positive for glucose without signs of ketones or infection.  CT scan of the abdomen pelvis significant for numerous colonic diverticula with mild adjacent fat stranding concerning for cecal diverticulitis.  Patient had initially been bolused 1.5 L of normal saline IV fluids, placed on insulin drip per protocol, and given Augmentin p.o.  Review of Systems:  As mentioned in the history of present illness. All other systems reviewed and are negative. Past Medical History:  Diagnosis Date   CAD (coronary artery disease)    ruptured plaque in OM 2003, NSTEMI/CABG 01/2020   Headache(784.0)    HLD (hyperlipidemia)    HTN (hypertension)    Mild carotid artery disease (HCC)    Pre-diabetes    RBBB (right bundle branch block)    Past Surgical History:  Procedure Laterality Date   CORONARY ARTERY BYPASS GRAFT N/A 01/21/2020   Procedure: CORONARY ARTERY BYPASS GRAFTING (CABG) TIMES 4 USING LIMA TO LAD; RIMA TO PDA; RADIAL HARVEST TO DIAG AND RAMUS.;  Surgeon: Linden DolinAtkins, Broadus Z, MD;  Location: [facility] OR;  Service: Open Heart Surgery;  Laterality: N/A;  BILATERAL IMA   LEFT HEART CATH AND CORONARY ANGIOGRAPHY N/A 01/19/2020   Procedure: LEFT HEART CATH AND CORONARY ANGIOGRAPHY;  Surgeon: Marykay LexHarding, David W, MD;  [facility];  Service: Cardiovascular;  Laterality: N/A;   RADIAL ARTERY HARVEST Left 01/21/2020   Procedure: RADIAL ARTERY HARVEST;  Surgeon: Linden DolinAtkins, Broadus Z, MD;  Location: [facility] OR;  Service: Open Heart Surgery;  Laterality: Left;   TEE WITHOUT CARDIOVERSION N/A 01/21/2020   Procedure: TRANSESOPHAGEAL ECHOCARDIOGRAM (TEE);  Surgeon: Linden DolinAtkins, Broadus Z, MD;  [facility];  Service: Open Heart Surgery;  Laterality: N/A;   Social History:  reports that he has been smoking cigarettes. He has been smoking an average of .25 packs per day. He has never used smokeless tobacco. He reports current alcohol use. He reports that he does not use drugs.  No Known Allergies  Family History  Family history unknown: Yes    Prior to Admission medications   Medication Sig Start Date End Date Taking? Authorizing Provider  amLODipine (NORVASC) 10 MG tablet Take 1 tablet by mouth once daily 09/19/21   Kathleene Hazel, MD  aspirin EC 81 MG tablet Take 1 tablet (81 mg total) by mouth daily. Swallow whole. 02/05/20   Furth, Cadence H, PA-C  atorvastatin  (LIPITOR) 40 MG tablet Take 1 tablet (40 mg total) by mouth daily. 04/24/21   Dunn, Tacey Ruiz, PA-C  benazepril (LOTENSIN) 40 MG tablet Take 1 tablet (40 mg total) by mouth daily. 04/24/21   Dunn, Tacey Ruiz, PA-C  isosorbide dinitrate (ISORDIL) 10 MG tablet Take 1 tablet (10 mg total) by mouth 3 (three) times daily. 09/19/21   Kathleene Hazel, MD  metoprolol tartrate (LOPRESSOR) 25 MG tablet TAKE 1 & 1/2 (ONE & ONE-HALF) TABLETS BY MOUTH TWICE DAILY 05/16/21   Kathleene Hazel, MD  Multiple Vitamins-Minerals (MULTIVITAMIN WITH MINERALS) tablet Take 1 tablet by mouth daily.    [provider]  nitroGLYCERIN (NITROSTAT) 0.4 MG SL tablet Place 1 tablet (0.4 mg total) under the tongue every 5 (five) minutes as needed for chest pain. 03/23/20 06/21/20  Laurann Montana, PA-C    Physical Exam: Vitals:   10/10/21 1215 10/10/21 1230 10/10/21 1245 10/10/21 1300  BP: 127/78 114/64 117/64 122/64  Pulse: 70 65 (!) 57 63  Resp: 16 10 15 19   Temp:      TempSrc:      SpO2: 99% 100% 100% 99%  Weight:      Height:       Exam  Constitutional: Elderly male who appears to be in no acute distress at this time Eyes: PERRL, lids and conjunctivae normal ENMT: Mucous membranes are dry.   Neck: normal, supple, no masses, no thyromegaly Respiratory: clear to auscultation bilaterally, no wheezing, no crackles. Normal respiratory effort.   Cardiovascular: Regular rate and rhythm, no murmurs / rubs / gallops. No extremity edema.   Abdomen: Mild tenderness of the right lower quadrant of the abdomen.  Bowel sounds present all 4 quadrants. Musculoskeletal: no clubbing / cyanosis. No joint deformity upper and lower extremities. Good ROM, no contractures.   Skin: no rashes, lesions, ulcers. No induration Neurologic: CN 2-12 grossly intact. Strength 5/5 in all 4.  Psychiatric: Normal judgment and insight. Alert and oriented x 3. Normal mood.   Data Reviewed:  Reviewed labs, imaging, and pertinent records  as noted above in HPI  Assessment and Plan: Diabetes mellitus type 2 with hyperosmolar hyperglycemic state Patient presented and was found to have glucose elevated up to 862 without elevated anion gap and elevated serum osmolarity.  Urinalysis did not show any signs of ketones but was positive for glucose.  Venous pH within normal limits.  Patient had been started on insulin drip after receiving 1.5 L normal saline IV fluids.  Last available hemoglobin A1c was noted to be 6.1 in 2021. -Admit to a progressive bed -Hypoglycemic protocols -Check hemoglobin A1c -Continue insulin drip per protocol -Plan to transition to subcu insulin once blood sugar less than 250. -Diabetic education consulted  Cecal diverticulitis Acute.  Patient had complained of some lower abdominal pain.  CT noted prominent cecal diverticuli with concern for cecal diverticulitis without complication.  ED provider had started patient on Augmentin p.o. -Continue Augmentin to complete 10-14 day course -Hydrocodone as needed for pain  Acute kidney injury Creatinine elevated up to 1.71 with  BUN 38.  Suspect prerenal cause in the setting of uncontrolled diabetes.  UA did not note any significant signs of infection.  His baseline creatinine previously had been noted to be around 0.8-1.  Lumbar -Continue IV fluids as tolerated -Avoid nephrotoxic agents such as naproxen and benazepril -Recheck kidney function tomorrow morning  Hyponatremia Acute.  Sodium 127 and when corrected for hyperglycemia is 145. -Continue to monitor  Essential hypertension Initial blood pressures were noted to be soft due to patient being dehydrated.  Home blood pressure regimen includes amlodipine 10 mg daily, benazepril 40 mg daily, isosorbide dinitrate 10 mg 3 times daily, and metoprolol 37.5 mg 2 times daily. -Held benazepril due to AKI -Plan to resume patient's other home medications in a.m.  CAD s/p CABG -Continue aspirin and statin   Advance  Care Planning:   Code Status: Full Code   Consults: Diabetic education  Family Communication: None requested  Severity of Illness: The appropriate patient status for this patient is INPATIENT. Inpatient status is judged to be reasonable and necessary in order to provide the required intensity of service to ensure the patient's safety. The patient's presenting symptoms, physical exam findings, and initial radiographic and laboratory data in the context of their chronic comorbidities is felt to place them at high risk for further clinical deterioration. Furthermore, it is not anticipated that the patient will be medically stable for discharge from the hospital within 2 midnights of admission.   * I certify that at the point of admission it is my clinical judgment that the patient will require inpatient hospital care spanning beyond 2 midnights from the point of admission due to high intensity of service, high risk for further deterioration and high frequency of surveillance required.*  Author: Clydie Braun, MD 10/10/2021 1:37 PM  For on call review [email or web address].

## 2021-10-10 NOTE — Plan of Care (Signed)
  Problem: Education: Goal: Knowledge of General Education information will improve Description: Including pain rating scale, medication(s)/side effects and non-pharmacologic comfort measures Outcome: Not Progressing   Problem: Health Behavior/Discharge Planning: Goal: Ability to manage health-related needs will improve Outcome: Not Progressing   Problem: Clinical Measurements: Goal: Ability to maintain clinical measurements within normal limits will improve Outcome: Not Progressing Goal: Will remain free from infection Outcome: Not Progressing Goal: Diagnostic test results will improve Outcome: Not Progressing Goal: Respiratory complications will improve Outcome: Not Progressing Goal: Cardiovascular complication will be avoided Outcome: Not Progressing   Problem: Activity: Goal: Risk for activity intolerance will decrease Outcome: Not Progressing   Problem: Nutrition: Goal: Adequate nutrition will be maintained Outcome: Not Progressing   Problem: Coping: Goal: Level of anxiety will decrease Outcome: Not Progressing   Problem: Elimination: Goal: Will not experience complications related to bowel motility Outcome: Not Progressing Goal: Will not experience complications related to urinary retention Outcome: Not Progressing   Problem: Skin Integrity: Goal: Risk for impaired skin integrity will decrease Outcome: Not Progressing   Problem: Education: Goal: Ability to describe self-care measures that may prevent or decrease complications (Diabetes Survival Skills Education) will improve Outcome: Not Progressing Goal: Individualized Educational Video(s) Outcome: Not Progressing   Problem: Health Behavior/Discharge Planning: Goal: Ability to identify and utilize available resources and services will improve Outcome: Not Progressing Goal: Ability to manage health-related needs will improve Outcome: Not Progressing   Problem: Metabolic: Goal: Ability to maintain  appropriate glucose levels will improve Outcome: Not Progressing   Problem: Nutritional: Goal: Maintenance of adequate nutrition will improve Outcome: Not Progressing   Problem: Tissue Perfusion: Goal: Adequacy of tissue perfusion will improve Outcome: Not Progressing

## 2021-10-10 NOTE — ED Triage Notes (Signed)
Pt reports that his cardiologist sent him here d/t low BP onset last Thursday. Pt is concerned that his medicine is causing his BP to be low. Pt is in NAD at this time.

## 2021-10-11 ENCOUNTER — Other Ambulatory Visit (HOSPITAL_COMMUNITY): Payer: Self-pay

## 2021-10-11 DIAGNOSIS — E1100 Type 2 diabetes mellitus with hyperosmolarity without nonketotic hyperglycemic-hyperosmolar coma (NKHHC): Secondary | ICD-10-CM

## 2021-10-11 LAB — COMPREHENSIVE METABOLIC PANEL
ALT: 42 U/L (ref 0–44)
AST: 19 U/L (ref 15–41)
Albumin: 2.7 g/dL — ABNORMAL LOW (ref 3.5–5.0)
Alkaline Phosphatase: 52 U/L (ref 38–126)
Anion gap: 4 — ABNORMAL LOW (ref 5–15)
BUN: 12 mg/dL (ref 8–23)
CO2: 26 mmol/L (ref 22–32)
Calcium: 8.9 mg/dL (ref 8.9–10.3)
Chloride: 107 mmol/L (ref 98–111)
Creatinine, Ser: 0.85 mg/dL (ref 0.61–1.24)
GFR, Estimated: >60 mL/min (ref >60)
Glucose, Bld: 113 mg/dL — ABNORMAL HIGH (ref 70–99)
Potassium: 4.2 mmol/L (ref 3.5–5.1)
Sodium: 137 mmol/L (ref 135–145)
Total Bilirubin: 0.6 mg/dL (ref 0.3–1.2)
Total Protein: 5.7 g/dL — ABNORMAL LOW (ref 6.5–8.1)

## 2021-10-11 LAB — CBC
HCT: 37.3 % — ABNORMAL LOW (ref 39.0–52.0)
Hemoglobin: 13 g/dL (ref 13.0–17.0)
MCH: 29.6 pg (ref 26.0–34.0)
MCHC: 34.9 g/dL (ref 30.0–36.0)
MCV: 85 fL (ref 80.0–100.0)
Platelets: 215 10*3/uL (ref 150–400)
RBC: 4.39 MIL/uL (ref 4.22–5.81)
RDW: 15 % (ref 11.5–15.5)
WBC: 6.9 10*3/uL (ref 4.0–10.5)
nRBC: 0 % (ref 0.0–0.2)

## 2021-10-11 LAB — GLUCOSE, CAPILLARY
Glucose-Capillary: 274 mg/dL — ABNORMAL HIGH (ref 70–99)
Glucose-Capillary: 116 mg/dL — ABNORMAL HIGH (ref 70–99)
Glucose-Capillary: 182 mg/dL — ABNORMAL HIGH (ref 70–99)

## 2021-10-11 LAB — HIV ANTIBODY (ROUTINE TESTING W REFLEX): HIV Screen 4th Generation wRfx: NONREACTIVE

## 2021-10-11 MED ORDER — INSULIN GLARGINE-YFGN 100 UNIT/ML ~~LOC~~ SOLN
8.0000 [IU] | Freq: Every day | SUBCUTANEOUS | Status: DC
  Administered 2021-10-11: 8 [IU] via SUBCUTANEOUS
  Filled 2021-10-11: qty 0.08

## 2021-10-11 MED ORDER — ISOSORBIDE DINITRATE 10 MG PO TABS
10.0000 mg | ORAL_TABLET | Freq: Three times a day (TID) | ORAL | 1 refills | Status: DC
  Filled 2021-10-11: qty 90, 30d supply, fill #0

## 2021-10-11 MED ORDER — NITROGLYCERIN 0.4 MG SL SUBL
0.4000 mg | SUBLINGUAL_TABLET | SUBLINGUAL | 1 refills | Status: AC | PRN
  Filled 2021-10-11: qty 25, 14d supply, fill #0

## 2021-10-11 MED ORDER — ONDANSETRON HCL 4 MG PO TABS
4.0000 mg | ORAL_TABLET | Freq: Four times a day (QID) | ORAL | 0 refills | Status: DC | PRN
  Filled 2021-10-11: qty 20, 5d supply, fill #0

## 2021-10-11 MED ORDER — AMOXICILLIN-POT CLAVULANATE 875-125 MG PO TABS
1.0000 | ORAL_TABLET | Freq: Two times a day (BID) | ORAL | 0 refills | Status: DC
Start: 2021-10-11 — End: 2021-10-11
  Filled 2021-10-11: qty 12, 6d supply, fill #0

## 2021-10-11 MED ORDER — LIVING WELL WITH DIABETES BOOK
Freq: Once | Status: AC
  Filled 2021-10-11: qty 1

## 2021-10-11 MED ORDER — ACETAMINOPHEN 325 MG PO TABS: 650.0000 mg | ORAL_TABLET | Freq: Four times a day (QID) | ORAL | Status: DC | PRN

## 2021-10-11 MED ORDER — ATORVASTATIN CALCIUM 40 MG PO TABS
40.0000 mg | ORAL_TABLET | Freq: Every day | ORAL | 1 refills | Status: DC
  Filled 2021-10-11: qty 30, 30d supply, fill #0

## 2021-10-11 MED ORDER — AMOXICILLIN-POT CLAVULANATE 875-125 MG PO TABS
1.0000 | ORAL_TABLET | Freq: Two times a day (BID) | ORAL | 0 refills | Status: AC
  Filled 2021-10-11: qty 18, 9d supply, fill #0

## 2021-10-11 MED ORDER — METFORMIN HCL 500 MG PO TABS
500.0000 mg | ORAL_TABLET | Freq: Two times a day (BID) | ORAL | 1 refills | Status: DC
  Filled 2021-10-11: qty 60, 30d supply, fill #0

## 2021-10-11 MED ORDER — METOPROLOL TARTRATE 25 MG PO TABS
25.0000 mg | ORAL_TABLET | Freq: Two times a day (BID) | ORAL | Status: DC
Start: 2021-10-11 — End: 2021-10-11
  Administered 2021-10-11: 25 mg via ORAL
  Filled 2021-10-11: qty 1

## 2021-10-11 MED ORDER — GLIMEPIRIDE 2 MG PO TABS
2.0000 mg | ORAL_TABLET | ORAL | 1 refills | Status: DC
  Filled 2021-10-11: qty 30, 30d supply, fill #0

## 2021-10-11 MED ORDER — METOPROLOL TARTRATE 25 MG PO TABS
25.0000 mg | ORAL_TABLET | Freq: Two times a day (BID) | ORAL | 1 refills | Status: DC
  Filled 2021-10-11: qty 60, 30d supply, fill #0

## 2021-10-11 NOTE — Progress Notes (Signed)
Mobility Specialist Progress Note   10/11/21 1045  Mobility  Activity Ambulated independently in hallway  Level of Assistance Independent after set-up  Assistive Device None  Distance Ambulated (ft) 550 ft  Activity Response Tolerated well  $Mobility charge 1 Mobility   Received pt in bed having no complaints and agreeable to mobility. Pt c/o chronic R knee pain that they have been dealing w/ for ~32yr but able to tolerate short - mid distances. Pt presenting w/ antalgic like gait when ambulating and claims this is their baseline. Returned back to bed w/o fault and call bell in reach; all needs met.  JHolland FallingMobility Specialist Phone Number [phone]

## 2021-10-11 NOTE — TOC Initial Note (Incomplete)
Transition of Care [facility]) - Initial/Assessment Note    Patient Details  Name: Todd Gonzalez MRN: [id number] Date of Birth: 1955-11-30  Transition of Care [facility]) CM/SW Contact:    Epifanio Lesches, RN Phone Number: 10/11/2021, 10:43 AM  Clinical Narrative:   Admitted with Diabetes mellitus type 2 with hyperosmolar hyperglycemic state. Hx of  hypertension, hyperlipidemia, CAD s/p CABG, and prediabetes.      From home alone. Independent with ADL's, PTA, no DME usage. Pt without PCP, no insurance. Limited income. Referral made with 1st to f/u with Medicaid potential status.  Consult received: New to insulin/uninsured. NCM shared [facility]. Pt interested. NCM made attempt to arranged hospital f/u with [facility], no appointments available. Clinic states nurse will call pt within 72 hours to arranged f/u appointment. [facility] information provide d to pt. Pt will be able to obtain medication from clinic pharmacy.  MD send Rx med to [facility] pharmacy. Match Letter given to assist with med cost Family will provide transportation to home  once d/c ready.         Expected Discharge Plan: Home/Self Care Barriers to Discharge: Continued Medical Work up   Patient Goals and CMS Choice        Expected Discharge Plan and Services Expected Discharge Plan: Home/Self Care In-house Referral: Artist (PCP/ [facility]) Discharge Planning Services: CM Consult                                          Prior Living Arrangements/Services   Lives with:: Self Patient language and need for interpreter reviewed:: Yes        Need for Family Participation in Patient Care: Yes (Comment) Care giver support system in place?: Yes (comment)   Criminal Activity/Legal Involvement Pertinent to Current Situation/Hospitalization: No - Comment as needed  Activities of Daily Living Home Assistive Devices/Equipment: None ADL Screening (condition at time of admission) Patient's cognitive ability adequate to  safely complete daily activities?: Yes Is the patient deaf or have difficulty hearing?: No Does the patient have difficulty seeing, even when wearing glasses/contacts?: No Does the patient have difficulty concentrating, remembering, or making decisions?: No Patient able to express need for assistance with ADLs?: Yes Does the patient have difficulty dressing or bathing?: No Independently performs ADLs?: Yes (appropriate for developmental age) Does the patient have difficulty walking or climbing stairs?: No Weakness of Legs: Both Weakness of Arms/Hands: None  Permission Sought/Granted   Permission granted to share information with : Yes, Verbal Permission Granted  Share Information with NAME: Ronnald Nian ( son) [phone]           Emotional Assessment Appearance:: Appears stated age Attitude/Demeanor/Rapport: Engaged Affect (typically observed): Accepting Orientation: : Oriented to Self, Oriented to Place, Oriented to  Time, Oriented to Situation Alcohol / Substance Use: Not Applicable Psych Involvement: No (comment)  Admission diagnosis:  Diverticulitis [K57.92] AKI (acute kidney injury) (HCC) [N17.9] Hyperosmolar hyperglycemic state (HHS) (HCC) [E11.00] Type 2 diabetes mellitus with hyperosmolar hyperglycemic state (HHS) (HCC) [E11.00] Patient Active Problem List   Diagnosis Date Noted   Type 2 diabetes mellitus with hyperosmolar hyperglycemic state (HHS) (HCC) 10/10/2021   AKI (acute kidney injury) (HCC) 10/10/2021   Cecal diverticulitis 10/10/2021   Hyponatremia 10/10/2021   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 ([place]), [place] - [address] [place]) [place] [address] Phone: [phone] Fax: [phone]  [facility] [address] [place] [address] Phone: [phone] Fax: [phone]     Social Determinants of Health (SDOH) Interventions    Readmission Risk Interventions     View : No data to display.

## 2021-10-11 NOTE — Progress Notes (Signed)
Nutrition Education Note  RD consulted for nutrition education regarding diabetes.   Lab Results  Component Value Date   HGBA1C 11.7 (H) 10/10/2021    RD provided "Carbohydrate Counting for People with Diabetes" and "Plate Method" handouts from the Academy of Nutrition and Dietetics. Discussed different food groups and their effects on blood sugar, emphasizing carbohydrate-containing foods. Provided list of carbohydrates and recommended serving sizes of common foods.  Discussed importance of controlled and consistent carbohydrate intake throughout the day. Provided examples of ways to balance meals/snacks and encouraged intake of high-fiber, whole grain complex carbohydrates. Teach back method used.  Expect good compliance. Patient states that he plans to stop using honey in his ginger green tea, change to brown rice instead of white rice, and stop drinking apple juice at home.   Body mass index is 27.88 kg/m. Pt meets criteria for overweight based on current BMI.  Current diet order is dysphagia 3 with thin liquids, patient is consuming approximately 100% of meals at this time. Labs and medications reviewed. No further nutrition interventions warranted at this time. RD contact information provided. If additional nutrition issues arise, please re-consult RD.   Gabriel Rainwater RD, LDN, CNSC Please refer to Amion for contact information.

## 2021-10-11 NOTE — TOC Progression Note (Signed)
Transition of Care [facility]) - Progression Note    Patient Details  Name: Todd Gonzalez MRN: [id number] Date of Birth: 1955-11-29  Transition of Care [facility]) CM/SW [place], RN Phone Number: 10/11/2021, 10:56 AM  Clinical Narrative:    CM spoke with Whitman Hero, RNCM and MATCH was completed for the patient to assist with costs for discharge medications.   Expected Discharge Plan: Home/Self Care Barriers to Discharge: Continued Medical Work up  Expected Discharge Plan and Services Expected Discharge Plan: Home/Self Care In-house Referral: Development worker, community (PCP/ [facility]) Discharge Planning Services: CM Consult                                           Social Determinants of Health (SDOH) Interventions    Readmission Risk Interventions     View : No data to display.

## 2021-10-11 NOTE — Discharge Summary (Signed)
Physician Discharge Summary  Todd Gonzalez ZOX:[id number] DOB: 1956/02/13 DOA: 10/10/2021  PCP: Patient, No Pcp Per (Inactive)  Admit date: 10/10/2021 Discharge date: 10/11/2021  Time spent: 60 minutes  Recommendations for Outpatient Follow-up:  Follow-up with Lucile Crater, [place] cardiology in 1 to 2 weeks.  On follow-up patient will need a basic metabolic profile done to follow-up on electrolytes and renal function.  Patient's blood pressure will need to be reassessed at some of patient's antihypertensive/cardiac medications were discontinued during this hospitalization. Follow-up at [facility].  On follow-up patient need a basic metabolic profile done to follow-up on electrolytes and renal function.  Patient's acute diverticulitis will need to be followed up upon.  Patient's blood pressure also need to be followed up upon.  Patient is newly diagnosed diabetes will need to be monitored and followed up upon.   Discharge Diagnoses:  Principal Problem:   Type 2 diabetes mellitus with hyperosmolar hyperglycemic state (HHS) (HCC) Active Problems:   Cecal diverticulitis   AKI (acute kidney injury) (HCC)   Hyponatremia   Essential hypertension   S/P CABG x 4   Coronary artery disease   Hyperosmolar hyperglycemic state (HHS) (HCC)   Discharge Condition: Stable and improved.  Diet recommendation: Heart healthy  Filed Weights   10/10/21 0844  Weight: 80.7 kg    History of present illness:  HPI per Dr. Hinton Rao is a 66 y.o. male with medical history significant of hypertension, hyperlipidemia, CAD s/p CABG, and prediabetes who presents after being noted to have low blood pressures from his cardiologist office.  For the last week he reports feeling weak and fatigued.  He reports having lightheadedness where he feels like he could possibly pass out if he stands up too fast.  He also reported having constant in the right lower quadrant of his  abdomen.  Denies having any significant nausea or vomiting.  He has had increased urinary frequency, increased thirst, and intermittent cough, and sinus drainage.  Denies having any significant fever, chills, chest pain, shortness of breath, dysuria, or recent sick contacts to his knowledge.  He had also recently been on prednisolone 15 mg mouth twice daily as needed for knee pain.   Upon admission into the emergency department patient was seen to be afebrile, pulse 57-79, blood pressure 92/57-127/78, and all other vital signs maintained.  Labs significant for hemoglobin 11.9, sodium 127, CO2 21, creatinine 38, creatinine 1.71, glucose 62, anion gap 9, albumin 2.8, AST 26,   ALT 46, and serum osmolarity 324.  Venous pH was 7.384.  Influenza and COVID-19 screening were negative.  Urinalysis was positive for glucose without signs of ketones or infection.  CT scan of the abdomen pelvis significant for numerous colonic diverticula with mild adjacent fat stranding concerning for cecal diverticulitis.  Patient had initially been bolused 1.5 L of normal saline IV fluids, placed on insulin drip per protocol, and given Augmentin p.o.  Hospital Course:  #1 newly diagnosed type 2 diabetes with hyperosmolar hyperglycemic state -Patient had presented for evaluation for hypotension, found to have glucose elevated to 862 without elevated anion gap. -Serum osmolality noted to be elevated. -Urinalysis was negative for ketones but positive for glucose. -Patient hydrated aggressively with IV fluids, placed on a insulin drip. -Hemoglobin A1c obtained was 11.7 (10/10/2021). -Patient blood glucose levels improved and subsequently transitioned to subcutaneous insulin as well as sliding scale insulin. -Patient seen by diabetic coordinator underwent diabetes education. -Patient also noted to  be on prednisolone at home prior to admission as needed and also patient noted to be in an acute diverticulitis. -Patient noted to have a  fasting blood glucose of 116 after receiving only Semglee 5 units during the hospitalization. -Patient will be discharged home on metformin 500 mg twice daily and Amaryl 2 mg daily with outpatient follow-up at community health and wellness center to establish PCP. -Patient be discharged in stable and improved condition.  2.  Acute cecal diverticulitis -Patient on presentation is complains of some lower abdominal pain. -CT abdomen and pelvis done noted a cecal diverticuli with concern for cecal diverticulitis without complication. -Patient started on Augmentin.Patient will be discharged -Patient be discharged home on 9 more days of oral Augmentin to complete a 10-day course of treatment. -Outpatient follow-up with PCP.  3.  Acute kidney injury -Patient noted to have a creatinine elevated 1.71 and a BUN of 38 on admission noted to be secondary to prerenal azotemia in the setting of uncontrolled diabetes and ACE inhibitor. -Urinalysis done was negative for acute infection. -Patient also noted to have soft blood pressure on admission. -Patient hydrated with IV fluids with resolution of acute kidney injury. -ACE inhibitor discontinued on discharge. -Outpatient follow-up.  4.  Pseudohyponatremia -Sodium levels on admission noted at 127 with corrected for hyperglycemia to 145.  5.  Hypertension -Initial blood pressures noted to be soft secondary to dehydration. -Patient noted to be on the home blood pressure regimen of amlodipine 10 mg daily, benazepril 40 mg daily, isosorbide dinitrate 10 mg 3 times daily, metoprolol 37.5 mg twice daily. -ACE inhibitor was held during the hospitalization due to acute kidney injury. -Norvasc also discontinued and metoprolol dose decreased to 25 mg twice daily. -Blood pressure improved and patient be discharged home on metoprolol 25 mg twice daily, isosorbide dinitrate 10 mg 3 times daily. -Outpatient follow-up with primary cardiologist and PCP.  6.  Coronary  artery disease status post CABG -Patient maintained on aspirin, statin, beta-blocker, isosorbide dinitrate. -ACE inhibitor discontinued due to acute kidney injury and soft blood pressure. -Metoprolol dose decreased to 25 mg twice daily. -Outpatient follow-up with cardiology in 1 to 2 weeks.   Procedures: CT abdomen and pelvis 10/10/2021 Chest x-ray 10/10/2021   Consultations: None  Discharge Exam: Vitals:   10/11/21 0418 10/11/21 1230  BP: 109/69 125/68  Pulse: 66 (!) 58  Resp: 16 15  Temp:  98 F (36.7 C)  SpO2: 100% 100%    General: NAD Cardiovascular: Regular rate rhythm no murmurs rubs or gallops.  No JVD.  No lower extremity edema. Respiratory: Clear to auscultation bilaterally.  No wheezes, no crackles, no rhonchi. Gastroenterology: Soft, nondistended, some tenderness to palpation in the right lower quadrant, positive bowel sounds.  No rebound.  No guarding.  Discharge Instructions   Discharge Instructions     Diet - low sodium heart healthy   Complete by: As directed    Increase activity slowly   Complete by: As directed       Allergies as of 10/11/2021   No Known Allergies      Medication List     STOP taking these medications    amLODipine 10 MG tablet Commonly known as: NORVASC   benazepril 40 MG tablet Commonly known as: LOTENSIN       TAKE these medications    acetaminophen 325 MG tablet Commonly known as: TYLENOL Take 2 tablets (650 mg total) by mouth every 6 (six) hours as needed for mild pain (or Fever >/=  101).   amoxicillin-clavulanate 875-125 MG tablet Commonly known as: AUGMENTIN Take 1 tablet by mouth every 12 (twelve) hours for 9 days.   aspirin EC 81 MG tablet Take 1 tablet (81 mg total) by mouth daily. Swallow whole.   atorvastatin 40 MG tablet Commonly known as: LIPITOR Take 1 tablet (40 mg total) by mouth daily.   glimepiride 2 MG tablet Commonly known as: Amaryl Take 1 tablet (2 mg total) by mouth every morning.    isosorbide dinitrate 10 MG tablet Commonly known as: ISORDIL Take 1 tablet (10 mg total) by mouth 3 (three) times daily.   metFORMIN 500 MG tablet Commonly known as: Glucophage Take 1 tablet (500 mg total) by mouth 2 (two) times daily with a meal.   metoprolol tartrate 25 MG tablet Commonly known as: LOPRESSOR Take 1 tablet (25 mg total) by mouth 2 (two) times daily. What changed: See the new instructions.   multivitamin with minerals tablet Take 1 tablet by mouth daily.   naproxen sodium 220 MG tablet Commonly known as: ALEVE Take 220-440 mg by mouth 2 (two) times daily as needed (pain).   nitroGLYCERIN 0.4 MG SL tablet Commonly known as: NITROSTAT Place 1 tablet (0.4 mg total) under the tongue every 5 (five) minutes as needed for chest pain.   ondansetron 4 MG tablet Commonly known as: ZOFRAN Take 1 tablet (4 mg total) by mouth every 6 (six) hours as needed for nausea.   prednisoLONE 5 MG Tabs tablet Take 15 mg by mouth 2 (two) times daily as needed (knee pain).       No Known Allergies  Follow-up Information     [facility] Follow up.   Why: Clinic will call patient  within 72 hours to schedule follow up  appointment. If patient hasn't received a call please call office at [phone]. Contact information: [address] [place] [phone]        Laurann Montanaunn, Dayna N, PA-C. Schedule an appointment as soon as possible for a visit in 2 week(s).   Specialties: Cardiology, Radiology Why: Follow-up in 1 to 2 weeks. Contact information: [address] [place] [address] [phone]                  The results of significant diagnostics from this hospitalization (including imaging, microbiology, ancillary and laboratory) are listed below for reference.    Significant Diagnostic Studies: DG Chest 2 View  Result Date: 10/10/2021 CLINICAL DATA:  Cough. EXAM:  CHEST - 2 VIEW COMPARISON:  Chest radiograph 02/01/2020. FINDINGS: No consolidation. No visible pleural effusions or pneumothorax. Similar cardiomediastinal silhouette. Median sternotomy. Extensive degenerative change of the shoulders with heterotopic ossification bilaterally, chronic. IMPRESSION: No evidence of acute cardiopulmonary disease. Electronically Signed   By: Feliberto HartsFrederick S Jones M.D.   On: 10/10/2021 09:25   CT Abdomen Pelvis W Contrast  Result Date: 10/10/2021 CLINICAL DATA:  Right-sided abdominal pain. EXAM: CT ABDOMEN AND PELVIS WITH CONTRAST TECHNIQUE: Multidetector CT imaging of the abdomen and pelvis was performed using the standard protocol following bolus administration of intravenous contrast. RADIATION DOSE REDUCTION: This exam was performed according to the departmental dose-optimization program which includes automated exposure control, adjustment of the mA and/or kV according to patient size and/or use of iterative reconstruction technique. CONTRAST:  90mL OMNIPAQUE IOHEXOL 300 MG/ML  SOLN COMPARISON:  None Available. FINDINGS: Lower chest: No acute abnormality. Hepatobiliary: No focal liver abnormality is seen. No gallstones, gallbladder  wall thickening, or biliary dilatation. Pancreas: Unremarkable. No pancreatic ductal dilatation or surrounding inflammatory changes. Spleen: Calcification along the lateral aspect of the spleen, likely sequela of prior traumatic injury. Adrenals/Urinary Tract: Adrenal glands are unremarkable. Kidneys are normal, without renal calculi, focal lesion, or hydronephrosis. Bladder is unremarkable. Stomach/Bowel: Distal esophagus and stomach are unremarkable. Small bowel loops are normal in caliber. Normal appendix. There are numerous diverticula throughout the colon prominent in the cecum and ascending colon. There is focal fatty infiltration adjacent to the cecum concerning for acute diverticulitis (series 3, image 50). No extraluminal free air or abdominal  collection/abscess. Vascular/Lymphatic: Aortic atherosclerosis. No enlarged abdominal or pelvic lymph nodes. Reproductive: Prostate is unremarkable. Other: There are fat containing bilateral inguinal hernias, right greater than the left. The fatty infiltration insert the hernial sac. Musculoskeletal: Degenerate disc disease of the lumbar spine. No acute osseous abnormality. IMPRESSION: 1. Numerous colonic diverticula, prominent in the cecum with mild adjacent fat stranding concerning for cecal diverticulitis. 2. No evidence of extraluminal free air or abdominal collection/abscess. 3.  Normal appendix. 4. Bilateral fat containing inguinal hernias, right greater than the left. 5.  Additional chronic findings as above. Electronically Signed   By: Larose Hires D.O.   On: 10/10/2021 12:31    Microbiology: Recent Results (from the past 240 hour(s))  Resp Panel by RT-PCR (Flu A&B, Covid) Nasopharyngeal Swab     Status: None   Collection Time: 10/10/21 10:07 AM   Specimen: Nasopharyngeal Swab; Nasopharyngeal(NP) swabs in vial transport medium  Result Value Ref Range Status   SARS Coronavirus 2 by RT PCR NEGATIVE NEGATIVE Final    Comment: (NOTE) SARS-CoV-2 target nucleic acids are NOT DETECTED.  The SARS-CoV-2 RNA is generally detectable in upper respiratory specimens during the acute phase of infection. The lowest concentration of SARS-CoV-2 viral copies this assay can detect is 138 copies/mL. A negative result does not preclude SARS-Cov-2 infection and should not be used as the sole basis for treatment or other patient management decisions. A negative result may occur with  improper specimen collection/handling, submission of specimen other than nasopharyngeal swab, presence of viral mutation(s) within the areas targeted by this assay, and inadequate number of viral copies(<138 copies/mL). A negative result must be combined with clinical observations, patient history, and  epidemiological information. The expected result is Negative.  Fact Sheet for Patients:  BloggerCourse.com  Fact Sheet for Healthcare Providers:  SeriousBroker.it  This test is no t yet approved or cleared by the [place] FDA and  has been authorized for detection and/or diagnosis of SARS-CoV-2 by FDA under an Emergency Use Authorization (EUA). This EUA will remain  in effect (meaning this test can be used) for the duration of the COVID-19 declaration under Section 564(b)(1) of the Act, 21 U.S.C.section 360bbb-3(b)(1), unless the authorization is terminated  or revoked sooner.       Influenza A by PCR NEGATIVE NEGATIVE Final   Influenza B by PCR NEGATIVE NEGATIVE Final    Comment: (NOTE) The Xpert Xpress SARS-CoV-2/FLU/RSV plus assay is intended as an aid in the diagnosis of influenza from Nasopharyngeal swab specimens and should not be used as a sole basis for treatment. Nasal washings and aspirates are unacceptable for Xpert Xpress SARS-CoV-2/FLU/RSV testing.  Fact Sheet for Patients: BloggerCourse.com  Fact Sheet for Healthcare Providers: SeriousBroker.it  This test is not yet approved or cleared by the [place] FDA and has been authorized for detection and/or diagnosis of SARS-CoV-2 by FDA under an Emergency Use Authorization (EUA). This  EUA will remain in effect (meaning this test can be used) for the duration of the COVID-19 declaration under Section 564(b)(1) of the Act, 21 U.S.C. section 360bbb-3(b)(1), unless the authorization is terminated or revoked.  Performed at [facility], [address], [place], [place] [address]      Labs: Basic Metabolic Panel: Recent Labs  Lab 10/10/21 0925 10/10/21 1232 10/11/21 0319  NA 127* 131* 137  K 5.1 5.2* 4.2  CL 97*  --  107  CO2 21*  --  26  GLUCOSE 862*  --  113*  BUN 38*  --  12  CREATININE  1.71*  --  0.85  CALCIUM 8.4*  --  8.9   Liver Function Tests: Recent Labs  Lab 10/10/21 0925 10/11/21 0319  AST 26 19  ALT 46* 42  ALKPHOS 60 52  BILITOT 0.5 0.6  PROT 5.6* 5.7*  ALBUMIN 2.8* 2.7*   Recent Labs  Lab 10/10/21 0925  LIPASE 47   No results for input(s): AMMONIA in the last 168 hours. CBC: Recent Labs  Lab 10/10/21 0925 10/10/21 1232 10/11/21 0319  WBC 7.2  --  6.9  NEUTROABS 4.2  --   --   HGB 11.9* 12.2* 13.0  HCT 35.3* 36.0* 37.3*  MCV 87.6  --  85.0  PLT 223  --  215   Cardiac Enzymes: No results for input(s): CKTOTAL, CKMB, CKMBINDEX, TROPONINI in the last 168 hours. BNP: BNP (last 3 results) No results for input(s): BNP in the last 8760 hours.  ProBNP (last 3 results) No results for input(s): PROBNP in the last 8760 hours.  CBG: Recent Labs  Lab 10/10/21 1948 10/10/21 2040 10/10/21 2213 10/11/21 0825 10/11/21 1232  GLUCAP 273* 274* 204* 116* 182*       Signed:  Ramiro Harvest MD.  [facility] 10/11/2021, 3:09 PM

## 2021-10-11 NOTE — Progress Notes (Addendum)
Inpatient Diabetes Program Recommendations  AACE/ADA: New Consensus Statement on Inpatient Glycemic Control (2015)  Target Ranges:  Prepandial:   less than 140 mg/dL      Peak postprandial:   less than 180 mg/dL (1-2 hours)      Critically ill patients:  140 - 180 mg/dL   Lab Results  Component Value Date   GLUCAP 116 (H) 10/11/2021   HGBA1C 11.7 (H) 10/10/2021    Review of Glycemic Control  Latest Reference Range & Units 10/10/21 18:33 10/10/21 19:48 10/10/21 20:40 10/10/21 22:13 10/11/21 08:25  Glucose-Capillary 70 - 99 mg/dL 810 (H) 175 (H) 102 (H) 204 (H) 116 (H)  (H): Data is abnormally high  Diabetes history: New Onset Current orders for Inpatient glycemic control: Semglee 8 units, Novolog 0-9 units tid, 0-5 units hs  Inpatient Diabetes Program Recommendations:   Fasting CBG 116 after receiving only Semglee 5 units. On discharge, patient may do well on orals Metformin 500 gm bid and Amaryl 2 mg qd. Will plan to speak with patient today and teach insulin injection along with taking a free glucose meter since has no insurance. Ordered Living Well With Diabetes booklet for patient.  11:00 Spoke with patient @ bedside. Reviewed glucose meter and supplies with patient and how to check CBGs. Discussed further plate method with limiting sugary drinks, foods and limiting carbohydrates. Patient states understanding to decrease amount of honey he is using in tea and change to a sugar free option and also measure brown rice to limit eating no more than a cup @ a time and increase green vegetables. Patient to follow up @ [facility].  Thank you, Billy Fischer. Ariya Bohannon, RN, MSN, CDE  Diabetes Coordinator Inpatient Glycemic Control Team Team Pager [phone] (8am-5pm) 10/11/2021 9:29 AM

## 2021-10-11 NOTE — Plan of Care (Signed)
Pt stable for discharge; pt has belongings for insulin at bedside no questions about administration. TOC meds at bedside, and IV removed Problem: Education: Goal: Knowledge of General Education information will improve Description: Including pain rating scale, medication(s)/side effects and non-pharmacologic comfort measures Outcome: Adequate for Discharge   Problem: Health Behavior/Discharge Planning: Goal: Ability to manage health-related needs will improve Outcome: Adequate for Discharge   Problem: Clinical Measurements: Goal: Ability to maintain clinical measurements within normal limits will improve Outcome: Adequate for Discharge Goal: Will remain free from infection Outcome: Adequate for Discharge Goal: Diagnostic test results will improve Outcome: Adequate for Discharge Goal: Respiratory complications will improve Outcome: Adequate for Discharge Goal: Cardiovascular complication will be avoided Outcome: Adequate for Discharge   Problem: Activity: Goal: Risk for activity intolerance will decrease Outcome: Adequate for Discharge   Problem: Nutrition: Goal: Adequate nutrition will be maintained Outcome: Adequate for Discharge   Problem: Coping: Goal: Level of anxiety will decrease Outcome: Adequate for Discharge   Problem: Elimination: Goal: Will not experience complications related to bowel motility Outcome: Adequate for Discharge Goal: Will not experience complications related to urinary retention Outcome: Adequate for Discharge   Problem: Pain Managment: Goal: General experience of comfort will improve Outcome: Adequate for Discharge   Problem: Safety: Goal: Ability to remain free from injury will improve Outcome: Adequate for Discharge   Problem: Skin Integrity: Goal: Risk for impaired skin integrity will decrease Outcome: Adequate for Discharge   Problem: Education: Goal: Ability to describe self-care measures that may prevent or decrease complications  (Diabetes Survival Skills Education) will improve Outcome: Adequate for Discharge Goal: Individualized Educational Video(s) Outcome: Adequate for Discharge   Problem: Coping: Goal: Ability to adjust to condition or change in health will improve Outcome: Adequate for Discharge   Problem: Fluid Volume: Goal: Ability to maintain a balanced intake and output will improve Outcome: Adequate for Discharge   Problem: Health Behavior/Discharge Planning: Goal: Ability to identify and utilize available resources and services will improve Outcome: Adequate for Discharge Goal: Ability to manage health-related needs will improve Outcome: Adequate for Discharge   Problem: Metabolic: Goal: Ability to maintain appropriate glucose levels will improve Outcome: Adequate for Discharge   Problem: Nutritional: Goal: Maintenance of adequate nutrition will improve Outcome: Adequate for Discharge Goal: Progress toward achieving an optimal weight will improve Outcome: Adequate for Discharge   Problem: Skin Integrity: Goal: Risk for impaired skin integrity will decrease Outcome: Adequate for Discharge   Problem: Tissue Perfusion: Goal: Adequacy of tissue perfusion will improve Outcome: Adequate for Discharge

## 2021-10-26 ENCOUNTER — Encounter: Payer: Self-pay | Admitting: Nurse Practitioner

## 2021-10-26 ENCOUNTER — Ambulatory Visit (INDEPENDENT_AMBULATORY_CARE_PROVIDER_SITE_OTHER): Payer: Self-pay | Admitting: Nurse Practitioner

## 2021-10-26 VITALS — BP 107/64 | HR 67 | Temp 98.1°F | Ht 65.5 in | Wt 150.6 lb

## 2021-10-26 DIAGNOSIS — E1100 Type 2 diabetes mellitus with hyperosmolarity without nonketotic hyperglycemic-hyperosmolar coma (NKHHC): Secondary | ICD-10-CM

## 2021-10-26 DIAGNOSIS — Z951 Presence of aortocoronary bypass graft: Secondary | ICD-10-CM

## 2021-10-26 DIAGNOSIS — I10 Essential (primary) hypertension: Secondary | ICD-10-CM

## 2021-10-26 DIAGNOSIS — I499 Cardiac arrhythmia, unspecified: Secondary | ICD-10-CM

## 2021-10-26 DIAGNOSIS — K5732 Diverticulitis of large intestine without perforation or abscess without bleeding: Secondary | ICD-10-CM

## 2021-10-26 MED ORDER — BENAZEPRIL HCL 40 MG PO TABS: 40.0000 mg | ORAL_TABLET | Freq: Every day | ORAL | 2 refills | Status: DC

## 2021-10-26 MED ORDER — METOPROLOL TARTRATE 25 MG PO TABS: 25.0000 mg | ORAL_TABLET | Freq: Two times a day (BID) | ORAL | 1 refills | Status: DC

## 2021-10-26 MED ORDER — MULTIVITAMINS PO CAPS: 1.0000 | ORAL_CAPSULE | Freq: Every day | ORAL | 2 refills | Status: AC

## 2021-10-26 NOTE — Progress Notes (Signed)
  ID: Todd Gonzalez, male    DOB: 10/02/1955, 66 y.o.   MRN: [id number]  Chief Complaint  Patient presents with   Follow-up    Patient is here to establish care and discuss his hospital visit. Patient was admitted to the hospital for his elevated blood sugar levels.    Referring provider: No ref. provider found  Recent Significant Events:  Hospital Admission: 10/10/21  Hospital Course:  #1 newly diagnosed type 2 diabetes with hyperosmolar hyperglycemic state -Patient had presented for evaluation for hypotension, found to have glucose elevated to 862 without elevated anion gap. -Serum osmolality noted to be elevated. -Urinalysis was negative for ketones but positive for glucose. -Patient hydrated aggressively with IV fluids, placed on a insulin drip. -Hemoglobin A1c obtained was 11.7 (10/10/2021). -Patient blood glucose levels improved and subsequently transitioned to subcutaneous insulin as well as sliding scale insulin. -Patient seen by diabetic coordinator underwent diabetes education. -Patient also noted to be on prednisolone at home prior to admission as needed and also patient noted to be in an acute diverticulitis. -Patient noted to have a fasting blood glucose of 116 after receiving only Semglee 5 units during the hospitalization. -Patient will be discharged home on metformin 500 mg twice daily and Amaryl 2 mg daily [facility]. -Patient be discharged in stable and improved condition.  2.  Acute cecal diverticulitis -Patient on presentation is complains of some lower abdominal pain. -CT abdomen and pelvis done noted a cecal diverticuli with concern for cecal diverticulitis without complication. -Patient started on Augmentin.Patient will be discharged -Patient be discharged home on 9 more days of oral Augmentin to complete a 10-day course of treatment. -Outpatient follow-up with PCP.  3.  Acute  kidney injury -Patient noted to have a creatinine elevated 1.71 and a BUN of 38 on admission noted to be secondary to prerenal azotemia in the setting of uncontrolled diabetes and ACE inhibitor. -Urinalysis done was negative for acute infection. -Patient also noted to have soft blood pressure on admission. -Patient hydrated with IV fluids with resolution of acute kidney injury. -ACE inhibitor discontinued on discharge. -Outpatient follow-up.  4.  Pseudohyponatremia -Sodium levels on admission noted at 127 with corrected for hyperglycemia to 145.  5.  Hypertension -Initial blood pressures noted to be soft secondary to dehydration. -Patient noted to be on the home blood pressure regimen of amlodipine 10 mg daily, benazepril 40 mg daily, isosorbide dinitrate 10 mg 3 times daily, metoprolol 37.5 mg twice daily. -ACE inhibitor was held during the hospitalization due to acute kidney injury. -Norvasc also discontinued and metoprolol dose decreased to 25 mg twice daily. -Blood pressure improved and patient be discharged home on metoprolol 25 mg twice daily, isosorbide dinitrate 10 mg 3 times daily. -Outpatient follow-up with primary cardiologist and PCP.  6.  Coronary artery disease status post CABG -Patient maintained on aspirin, statin, beta-blocker, isosorbide dinitrate. -ACE inhibitor discontinued due to acute kidney injury and soft blood pressure. -Metoprolol dose decreased to 25 mg twice daily. -Outpatient follow-up with cardiology in 1 to 2 weeks.    HPI   Todd Gonzalez is a 66 y.o. male with medical history significant of hypertension, hyperlipidemia, CAD s/p CABG, and prediabetes    Patient presents today for hospital follow-up.  He is also here to establish care today.  Please see hospital note above.  Patient reports that he has been doing well since hospital discharge.  He does have his medications  here with him today and he states that he is taking them as directed.  The only  medication he did not finish with his Augmentin.  This was prescribed for diverticulitis.  He did complete a 7-day course of this medication.  He reports that he does not have any abdominal pain.  He states that his incisions from CABG are healing well.  Patient was not able to get an appointment for follow-up with cardiology yet.  We will place another referral today as urgent.  Patient was supposed to follow-up within 1 to 2 weeks of hospital discharge.  Patient's heart rhythm was noted to be irregular in office today but EKG showed normal sinus rhythm. Denies f/c/s, n/v/d, hemoptysis, PND, chest pain or edema.    No Known Allergies   There is no immunization history on file for this patient.  Past Medical History:  Diagnosis Date   CAD (coronary artery disease)    ruptured plaque in OM 2003, NSTEMI/CABG 01/2020   Headache(784.0)    HLD (hyperlipidemia)    HTN (hypertension)    Mild carotid artery disease (HCC)    Pre-diabetes    RBBB (right bundle branch block)     Tobacco History: Social History   Tobacco Use  Smoking Status Some Days   Packs/day: 1.00   Types: Cigarettes  Smokeless Tobacco Never  Tobacco Comments   1 pack every 4 days   Ready to quit: Yes Counseling given: Not Answered Tobacco comments: 1 pack every 4 days   Outpatient Encounter Medications as of 10/26/2021  Medication Sig   acetaminophen (TYLENOL) 325 MG tablet Take 2 tablets (650 mg total) by mouth every 6 (six) hours as needed for mild pain (or Fever >/= 101).   amLODipine (NORVASC) 10 MG tablet Take 10 mg by mouth daily. Take 1 tablet by mouth daily.   aspirin EC 81 MG tablet Take 1 tablet (81 mg total) by mouth daily. Swallow whole.   atorvastatin (LIPITOR) 40 MG tablet Take 1 tablet (40 mg total) by mouth daily.   glimepiride (AMARYL) 2 MG tablet Take 1 tablet (2 mg total) by mouth every morning.   isosorbide dinitrate (ISORDIL) 10 MG tablet Take 1 tablet (10 mg total) by mouth 3 (three) times  daily.   metFORMIN (GLUCOPHAGE) 500 MG tablet Take 1 tablet (500 mg total) by mouth 2 (two) times daily with a meal.   Multiple Vitamin (MULTIVITAMIN) capsule Take 1 capsule by mouth daily.   Multiple Vitamins-Minerals (MULTIVITAMIN WITH MINERALS) tablet Take 1 tablet by mouth daily.   naproxen sodium (ALEVE) 220 MG tablet Take 220-440 mg by mouth 2 (two) times daily as needed (pain).   ondansetron (ZOFRAN) 4 MG tablet Take 1 tablet (4 mg total) by mouth every 6 (six) hours as needed for nausea.   [DISCONTINUED] benazepril (LOTENSIN) 40 MG tablet Take 40 mg by mouth daily. Take 1 tablet by mouth daily.   [DISCONTINUED] metoprolol tartrate (LOPRESSOR) 25 MG tablet Take 1 tablet (25 mg total) by mouth 2 (two) times daily.   benazepril (LOTENSIN) 40 MG tablet Take 1 tablet (40 mg total) by mouth daily. Take 1 tablet by mouth daily.   metoprolol tartrate (LOPRESSOR) 25 MG tablet Take 1 tablet (25 mg total) by mouth 2 (two) times daily.   nitroGLYCERIN (NITROSTAT) 0.4 MG SL tablet Place 1 tablet (0.4 mg total) under the tongue every 5 (five) minutes as needed for chest pain.   prednisoLONE 5 MG TABS tablet Take 15 mg by mouth  2 (two) times daily as needed (knee pain).   No facility-administered encounter medications on file as of 10/26/2021.     Review of Systems  Review of Systems  Constitutional: Negative.   HENT: Negative.    Cardiovascular: Negative.   Gastrointestinal: Negative.   Allergic/Immunologic: Negative.   Neurological: Negative.   Psychiatric/Behavioral: Negative.         Physical Exam  BP 107/64   Pulse 67   Temp 98.1 F (36.7 C)   Ht 5' 5.5" (1.664 m)   Wt 150 lb 9.6 oz (68.3 kg)   SpO2 96%   BMI 24.68 kg/m   Wt Readings from Last 5 Encounters:  10/26/21 150 lb 9.6 oz (68.3 kg)  10/10/21 178 lb (80.7 kg)  04/24/21 167 lb (75.8 kg)  03/18/20 153 lb 6.4 oz (69.6 kg)  02/15/20 152 lb (68.9 kg)     Physical Exam Vitals and nursing note reviewed.   Constitutional:      General: He is not in acute distress.    Appearance: He is well-developed.  Cardiovascular:     Rate and Rhythm: Normal rate and regular rhythm.  Pulmonary:     Effort: Pulmonary effort is normal.     Breath sounds: Normal breath sounds.  Skin:    General: Skin is warm and dry.  Neurological:     Mental Status: He is alert and oriented to person, place, and time.      Lab Results:  CBC    Component Value Date/Time   WBC 6.9 10/11/2021 0319   RBC 4.39 10/11/2021 0319   HGB 13.0 10/11/2021 0319   HGB 13.4 04/24/2021 0912   HCT 37.3 (L) 10/11/2021 0319   HCT 39.4 04/24/2021 0912   PLT 215 10/11/2021 0319   PLT 283 04/24/2021 0912   MCV 85.0 10/11/2021 0319   MCV 89 04/24/2021 0912   MCH 29.6 10/11/2021 0319   MCHC 34.9 10/11/2021 0319   RDW 15.0 10/11/2021 0319   RDW 13.6 04/24/2021 0912   LYMPHSABS 2.2 10/10/2021 0925   MONOABS 0.6 10/10/2021 0925   EOSABS 0.1 10/10/2021 0925   BASOSABS 0.1 10/10/2021 0925    BMET    Component Value Date/Time   NA 137 10/11/2021 0319   NA 139 04/24/2021 0912   K 4.2 10/11/2021 0319   CL 107 10/11/2021 0319   CO2 26 10/11/2021 0319   GLUCOSE 113 (H) 10/11/2021 0319   BUN 12 10/11/2021 0319   BUN 21 04/24/2021 0912   CREATININE 0.85 10/11/2021 0319   CALCIUM 8.9 10/11/2021 0319   GFRNONAA >60 10/11/2021 0319   GFRAA 99 05/09/2020 1059    BNP No results found for: "BNP"  ProBNP No results found for: "PROBNP"  Imaging: CT Abdomen Pelvis W Contrast  Result Date: 10/10/2021 CLINICAL DATA:  Right-sided abdominal pain. EXAM: CT ABDOMEN AND PELVIS WITH CONTRAST TECHNIQUE: Multidetector CT imaging of the abdomen and pelvis was performed using the standard protocol following bolus administration of intravenous contrast. RADIATION DOSE REDUCTION: This exam was performed according to the departmental dose-optimization program which includes automated exposure control, adjustment of the mA and/or kV according  to patient size and/or use of iterative reconstruction technique. CONTRAST:  90mL OMNIPAQUE IOHEXOL 300 MG/ML  SOLN COMPARISON:  None Available. FINDINGS: Lower chest: No acute abnormality. Hepatobiliary: No focal liver abnormality is seen. No gallstones, gallbladder wall thickening, or biliary dilatation. Pancreas: Unremarkable. No pancreatic ductal dilatation or surrounding inflammatory changes. Spleen: Calcification along the lateral aspect of the  spleen, likely sequela of prior traumatic injury. Adrenals/Urinary Tract: Adrenal glands are unremarkable. Kidneys are normal, without renal calculi, focal lesion, or hydronephrosis. Bladder is unremarkable. Stomach/Bowel: Distal esophagus and stomach are unremarkable. Small bowel loops are normal in caliber. Normal appendix. There are numerous diverticula throughout the colon prominent in the cecum and ascending colon. There is focal fatty infiltration adjacent to the cecum concerning for acute diverticulitis (series 3, image 50). No extraluminal free air or abdominal collection/abscess. Vascular/Lymphatic: Aortic atherosclerosis. No enlarged abdominal or pelvic lymph nodes. Reproductive: Prostate is unremarkable. Other: There are fat containing bilateral inguinal hernias, right greater than the left. The fatty infiltration insert the hernial sac. Musculoskeletal: Degenerate disc disease of the lumbar spine. No acute osseous abnormality. IMPRESSION: 1. Numerous colonic diverticula, prominent in the cecum with mild adjacent fat stranding concerning for cecal diverticulitis. 2. No evidence of extraluminal free air or abdominal collection/abscess. 3.  Normal appendix. 4. Bilateral fat containing inguinal hernias, right greater than the left. 5.  Additional chronic findings as above. Electronically Signed   By: Larose HiresImran  Ahmed D.O.   On: 10/10/2021 12:31   DG Chest 2 View  Result Date: 10/10/2021 CLINICAL DATA:  Cough. EXAM: CHEST - 2 VIEW COMPARISON:  Chest radiograph  02/01/2020. FINDINGS: No consolidation. No visible pleural effusions or pneumothorax. Similar cardiomediastinal silhouette. Median sternotomy. Extensive degenerative change of the shoulders with heterotopic ossification bilaterally, chronic. IMPRESSION: No evidence of acute cardiopulmonary disease. Electronically Signed   By: Feliberto HartsFrederick S Jones M.D.   On: 10/10/2021 09:25     Assessment & Plan:   S/P CABG x 4 - Ambulatory referral to Cardiology  2. Essential hypertension  - CBC - Basic Metabolic Panel  3. Cecal diverticulitis  Improved - did not complete augmentin   4. Type 2 diabetes mellitus with hyperosmolar hyperglycemic state (HHS) (HCC)  Lab Results  Component Value Date   HGBA1C 11.7 (H) 10/10/2021     - CBC - Basic Metabolic Panel  Diabetic diet  Continue current medications: Current Outpatient Medications on File Prior to Visit  Medication Sig Dispense Refill   acetaminophen (TYLENOL) 325 MG tablet Take 2 tablets (650 mg total) by mouth every 6 (six) hours as needed for mild pain (or Fever >/= 101).     amLODipine (NORVASC) 10 MG tablet Take 10 mg by mouth daily. Take 1 tablet by mouth daily.     aspirin EC 81 MG tablet Take 1 tablet (81 mg total) by mouth daily. Swallow whole. 90 tablet 3   atorvastatin (LIPITOR) 40 MG tablet Take 1 tablet (40 mg total) by mouth daily. 30 tablet 1   benazepril (LOTENSIN) 40 MG tablet Take 40 mg by mouth daily. Take 1 tablet by mouth daily.     glimepiride (AMARYL) 2 MG tablet Take 1 tablet (2 mg total) by mouth every morning. 30 tablet 1   isosorbide dinitrate (ISORDIL) 10 MG tablet Take 1 tablet (10 mg total) by mouth 3 (three) times daily. 90 tablet 1   metFORMIN (GLUCOPHAGE) 500 MG tablet Take 1 tablet (500 mg total) by mouth 2 (two) times daily with a meal. 60 tablet 1   metoprolol tartrate (LOPRESSOR) 25 MG tablet Take 1 tablet (25 mg total) by mouth 2 (two) times daily. 60 tablet 1   Multiple Vitamins-Minerals (MULTIVITAMIN  WITH MINERALS) tablet Take 1 tablet by mouth daily.     naproxen sodium (ALEVE) 220 MG tablet Take 220-440 mg by mouth 2 (two) times daily as needed (pain).  ondansetron (ZOFRAN) 4 MG tablet Take 1 tablet (4 mg total) by mouth every 6 (six) hours as needed for nausea. 20 tablet 0   nitroGLYCERIN (NITROSTAT) 0.4 MG SL tablet Place 1 tablet (0.4 mg total) under the tongue every 5 (five) minutes as needed for chest pain. 25 tablet 1   prednisoLONE 5 MG TABS tablet Take 15 mg by mouth 2 (two) times daily as needed (knee pain).     No current facility-administered medications on file prior to visit.      Laurann Montana, PA-C. Schedule an appointment as soon as possible for a visit in 2 week(s).   Specialties: Cardiology, Radiology Why: Follow-up in 1 to 2 weeks. Contact information: [address] [place] [address] [phone]   Follow up:  Follow up in 3 months or sooner if needed     Ivonne Andrew, NP 10/26/2021

## 2021-10-26 NOTE — Assessment & Plan Note (Signed)
-   Ambulatory referral to Cardiology  2. Essential hypertension  - CBC - Basic Metabolic Panel  3. Cecal diverticulitis  Improved - did not complete augmentin   4. Type 2 diabetes mellitus with hyperosmolar hyperglycemic state (HHS) (HCC)  Lab Results  Component Value Date   HGBA1C 11.7 (H) 10/10/2021     - CBC - Basic Metabolic Panel  Diabetic diet  Continue current medications: Current Outpatient Medications on File Prior to Visit  Medication Sig Dispense Refill  . acetaminophen (TYLENOL) 325 MG tablet Take 2 tablets (650 mg total) by mouth every 6 (six) hours as needed for mild pain (or Fever >/= 101).    Marland Kitchen amLODipine (NORVASC) 10 MG tablet Take 10 mg by mouth daily. Take 1 tablet by mouth daily.    Marland Kitchen aspirin EC 81 MG tablet Take 1 tablet (81 mg total) by mouth daily. Swallow whole. 90 tablet 3  . atorvastatin (LIPITOR) 40 MG tablet Take 1 tablet (40 mg total) by mouth daily. 30 tablet 1  . benazepril (LOTENSIN) 40 MG tablet Take 40 mg by mouth daily. Take 1 tablet by mouth daily.    Marland Kitchen glimepiride (AMARYL) 2 MG tablet Take 1 tablet (2 mg total) by mouth every morning. 30 tablet 1  . isosorbide dinitrate (ISORDIL) 10 MG tablet Take 1 tablet (10 mg total) by mouth 3 (three) times daily. 90 tablet 1  . metFORMIN (GLUCOPHAGE) 500 MG tablet Take 1 tablet (500 mg total) by mouth 2 (two) times daily with a meal. 60 tablet 1  . metoprolol tartrate (LOPRESSOR) 25 MG tablet Take 1 tablet (25 mg total) by mouth 2 (two) times daily. 60 tablet 1  . Multiple Vitamins-Minerals (MULTIVITAMIN WITH MINERALS) tablet Take 1 tablet by mouth daily.    . naproxen sodium (ALEVE) 220 MG tablet Take 220-440 mg by mouth 2 (two) times daily as needed (pain).    . ondansetron (ZOFRAN) 4 MG tablet Take 1 tablet (4 mg total) by mouth every 6 (six) hours as needed for nausea. 20 tablet 0  . nitroGLYCERIN (NITROSTAT) 0.4 MG SL tablet Place 1 tablet (0.4 mg total) under the tongue every 5 (five) minutes as  needed for chest pain. 25 tablet 1  . prednisoLONE 5 MG TABS tablet Take 15 mg by mouth 2 (two) times daily as needed (knee pain).     No current facility-administered medications on file prior to visit.      Laurann Montana, PA-C. Schedule an appointment as soon as possible for a visit in 2 week(s).   Specialties: Cardiology, Radiology Why: Follow-up in 1 to 2 weeks. Contact information: [address] [place] [address] [phone]   Follow up:  Follow up in 3 months or sooner if needed

## 2021-10-26 NOTE — Patient Instructions (Addendum)
1. S/P CABG x 4  - Ambulatory referral to Cardiology  2. Essential hypertension  - CBC - Basic Metabolic Panel  3. Cecal diverticulitis  Improved - did not complete augmentin   4. Type 2 diabetes mellitus with hyperosmolar hyperglycemic state (HHS) (HCC)  Lab Results  Component Value Date   HGBA1C 11.7 (H) 10/10/2021     - CBC - Basic Metabolic Panel  Diabetic diet  Continue current medications: Current Outpatient Medications on File Prior to Visit  Medication Sig Dispense Refill   acetaminophen (TYLENOL) 325 MG tablet Take 2 tablets (650 mg total) by mouth every 6 (six) hours as needed for mild pain (or Fever >/= 101).     amLODipine (NORVASC) 10 MG tablet Take 10 mg by mouth daily. Take 1 tablet by mouth daily.     aspirin EC 81 MG tablet Take 1 tablet (81 mg total) by mouth daily. Swallow whole. 90 tablet 3   atorvastatin (LIPITOR) 40 MG tablet Take 1 tablet (40 mg total) by mouth daily. 30 tablet 1   benazepril (LOTENSIN) 40 MG tablet Take 40 mg by mouth daily. Take 1 tablet by mouth daily.     glimepiride (AMARYL) 2 MG tablet Take 1 tablet (2 mg total) by mouth every morning. 30 tablet 1   isosorbide dinitrate (ISORDIL) 10 MG tablet Take 1 tablet (10 mg total) by mouth 3 (three) times daily. 90 tablet 1   metFORMIN (GLUCOPHAGE) 500 MG tablet Take 1 tablet (500 mg total) by mouth 2 (two) times daily with a meal. 60 tablet 1   metoprolol tartrate (LOPRESSOR) 25 MG tablet Take 1 tablet (25 mg total) by mouth 2 (two) times daily. 60 tablet 1   Multiple Vitamins-Minerals (MULTIVITAMIN WITH MINERALS) tablet Take 1 tablet by mouth daily.     naproxen sodium (ALEVE) 220 MG tablet Take 220-440 mg by mouth 2 (two) times daily as needed (pain).     ondansetron (ZOFRAN) 4 MG tablet Take 1 tablet (4 mg total) by mouth every 6 (six) hours as needed for nausea. 20 tablet 0   nitroGLYCERIN (NITROSTAT) 0.4 MG SL tablet Place 1 tablet (0.4 mg total) under the tongue every 5 (five)  minutes as needed for chest pain. 25 tablet 1   prednisoLONE 5 MG TABS tablet Take 15 mg by mouth 2 (two) times daily as needed (knee pain).     No current facility-administered medications on file prior to visit.      Laurann Montana, PA-C. Schedule an appointment as soon as possible for a visit in 2 week(s).   Specialties: Cardiology, Radiology Why: Follow-up in 1 to 2 weeks. Contact information: [address] [place] [address] [phone]   Follow up:  Follow up in 3 months or sooner if needed

## 2021-10-27 LAB — CBC
Hematocrit: 35.5 % — ABNORMAL LOW (ref 37.5–51.0)
Hemoglobin: 12 g/dL — ABNORMAL LOW (ref 13.0–17.7)
MCH: 29.3 pg (ref 26.6–33.0)
MCHC: 33.8 g/dL (ref 31.5–35.7)
MCV: 87 fL (ref 79–97)
Platelets: 346 10*3/uL (ref 150–450)
RBC: 4.1 x10E6/uL — ABNORMAL LOW (ref 4.14–5.80)
RDW: 15.9 % — ABNORMAL HIGH (ref 11.6–15.4)
WBC: 14.7 10*3/uL — ABNORMAL HIGH (ref 3.4–10.8)

## 2021-10-27 LAB — BASIC METABOLIC PANEL
BUN/Creatinine Ratio: 21 (ref 10–24)
BUN: 27 mg/dL (ref 8–27)
CO2: 21 mmol/L (ref 20–29)
Calcium: 9.2 mg/dL (ref 8.6–10.2)
Chloride: 102 mmol/L (ref 96–106)
Creatinine, Ser: 1.26 mg/dL (ref 0.76–1.27)
Glucose: 246 mg/dL — ABNORMAL HIGH (ref 70–99)
Potassium: 5.4 mmol/L — ABNORMAL HIGH (ref 3.5–5.2)
Sodium: 139 mmol/L (ref 134–144)
eGFR: 63 mL/min/{1.73_m2} (ref >59)

## 2021-11-08 NOTE — Progress Notes (Signed)
Cardiology Office Note:    Date:  11/09/2021   ID:  Todd Gonzalez, Todd Gonzalez 10-31-1955, MRN [id number]  PCP:  Todd Andrew, NP   [facility] Providers Cardiologist:  Verne Carrow, MD     Referring MD: Todd Andrew, NP   Follow up   History of Present Illness:    Todd Gonzalez is a 66 y.o. male with a hx of CAD s/p ruptured plaque in OM in 2003, NSTEMI with CABGx4 on 01/21/20, HTN, HLD, uncontrolled DM, CKD stage II, bifascicular block (RBBB & LAFB) and diverticulitis who presents to clinic for follow up.  He followed with our team remotely then was lost to follow-up. He returned to the hospital in August 2021 with chest pain and NSTEMI, found to have multivessel disease and underwent CABGx4 with LIMA-LAD, RIMA-LPDA, L-radial-sequential-OM1-diagonal on 01/21/20. Preop echo had shown normal LVEF 60-65%, + WMA, grade 1 DD. He was originally thought to be statin intolerant but this was previously a cost issue so atorvastatin was initiated at discharge. He was also started on DAPT given NSTEMI.   He was recently admitted 5/23-5/24/23 for newly diagnosed type 2 diabetes with hyperosmolar hyperglycemic state as well as acute cecal diverticulitis and AKI with a creat up to 1.71. This later improved to 1.26. Potassium was 5.4.  He has not been seen in our office since 04/2021. Plavix was stopped at this time.   Today the patient presents to clinic for follow up. He works in housekeeping and gets exertional chest pain with moderate activity like running from the bus. He occasionally uses a SL NTG. No SOB. No LE edema, orthopnea or PND. Occasionally gets dizziness when hypoglycemic but no syncope. No blood in stool or urine. No palpitations.    Past Medical History:  Diagnosis Date   CAD (coronary artery disease)    ruptured plaque in OM 2003, NSTEMI/CABG 01/2020   Headache(784.0)    HLD (hyperlipidemia)    HTN (hypertension)    Mild carotid artery disease (HCC)    Pre-diabetes     RBBB (right bundle branch block)     Past Surgical History:  Procedure Laterality Date   CORONARY ARTERY BYPASS GRAFT N/A 01/21/2020   Procedure: CORONARY ARTERY BYPASS GRAFTING (CABG) TIMES 4 USING LIMA TO LAD; RIMA TO PDA; RADIAL HARVEST TO DIAG AND RAMUS.;  Surgeon: [name], MD;  Location: [facility] OR;  Service: Open Heart Surgery;  Laterality: N/A;  BILATERAL IMA   LEFT HEART CATH AND CORONARY ANGIOGRAPHY N/A 01/19/2020   Procedure: LEFT HEART CATH AND CORONARY ANGIOGRAPHY;  Surgeon: Marykay Lex, MD;  [facility] INV[facility] CV LAB;  Service: Cardiovascular;  Laterality: N/A;   RADIAL ARTERY HARVEST Left 01/21/2020   Procedure: RADIAL ARTERY HARVEST;  Surgeon: [name], MD;  Location: [facility] OR;  Service: Open Heart Surgery;  Laterality: Left;   TEE WITHOUT CARDIOVERSION N/A 01/21/2020   Procedure: TRANSESOPHAGEAL ECHOCARDIOGRAM (TEE);  Surgeon: [name], MD;  Location: [facility] OR;  Service: Open Heart Surgery;  Laterality: N/A;    Current Medications: Current Meds  Medication Sig   amLODipine (NORVASC) 10 MG tablet Take 10 mg by mouth daily. Take 1 tablet by mouth daily.   aspirin EC 81 MG tablet Take 1 tablet (81 mg total) by mouth daily. Swallow whole.   atorvastatin (LIPITOR) 40 MG tablet Take 1 tablet (40 mg total) by mouth daily.   benazepril (LOTENSIN) 40 MG tablet Take 1 tablet (40 mg total) by mouth daily.  Take 1 tablet by mouth daily.   metoprolol tartrate (LOPRESSOR) 25 MG tablet Take 1 tablet (25 mg total) by mouth 2 (two) times daily.   Multiple Vitamin (MULTIVITAMIN) capsule Take 1 capsule by mouth daily.   Multiple Vitamins-Minerals (MULTIVITAMIN WITH MINERALS) tablet Take 1 tablet by mouth daily.   naproxen sodium (ALEVE) 220 MG tablet Take 220-440 mg by mouth 2 (two) times daily as needed (pain).   nitroGLYCERIN (NITROSTAT) 0.4 MG SL tablet Place 1 tablet (0.4 mg total) under the tongue every 5 (five) minutes as needed for chest pain.   ondansetron  (ZOFRAN) 4 MG tablet Take 1 tablet (4 mg total) by mouth every 6 (six) hours as needed for nausea.   prednisoLONE 5 MG TABS tablet Take 15 mg by mouth 2 (two) times daily as needed (knee pain).   [DISCONTINUED] glimepiride (AMARYL) 2 MG tablet Take 1 tablet (2 mg total) by mouth every morning.   [DISCONTINUED] isosorbide dinitrate (ISORDIL) 10 MG tablet Take 1 tablet (10 mg total) by mouth 3 (three) times daily.   [DISCONTINUED] metFORMIN (GLUCOPHAGE) 500 MG tablet Take 1 tablet (500 mg total) by mouth 2 (two) times daily with a meal.     Allergies:   Patient has no known allergies.   Social History   Socioeconomic History   Marital status: Married    Spouse name: Not on file   Number of children: Not on file   Years of education: Not on file   Highest education level: Not on file  Occupational History   Not on file  Tobacco Use   Smoking status: Some Days    Packs/day: 1.00    Types: Cigarettes   Smokeless tobacco: Never   Tobacco comments:    1 pack every 4 days  Vaping Use   Vaping Use: Never used  Substance and Sexual Activity   Alcohol use: Yes    Comment: occ   Drug use: No   Sexual activity: Yes    Birth control/protection: None  Other Topics Concern   Not on file  Social History Narrative   Not working; helps out at a [employer].    Lives with friend Todd Gonzalez; has a girlfriend.    Social Determinants of Health   Financial Resource Strain: Not on file  Food Insecurity: Not on file  Transportation Needs: Not on file  Physical Activity: Not on file  Stress: Not on file  Social Connections: Not on file     Family History: The patient's Family history is unknown by patient.  ROS:   Please see the history of present illness.     All other systems reviewed and are negative.  EKGs/Labs/Other Studies Reviewed:    The following studies were reviewed today:    LHC 01/19/20   Mid LM to Dist LM lesion is 30% stenosed.   Ost Cx to Mid Cx lesion is 70%  stenosed with 60% stenosed side branch in 1st Mrg.   CULPRIT LESION mid Cx to Dist Cx lesion is 95% stenosed -was initially 100% occluded, opened up somewhat with injections.   Mid LAD lesion is 70% stenosed with 70% stenosed side branch in 2nd Diag.   Dist LAD lesion is 75% stenosed.   ------------------------------   The left ventricular systolic function is normal. The left ventricular ejection fraction is 55-65% by visual estimate.   LV end diastolic pressure is normal.     SUMMARY   Severe multivessel disease:  ? Diffuse ostial proximal LCx  disease involving takeoff of 1st Mrg with initial total occlusion prior to small caliber diffusely diseased 2nd Mrg (now 90% ulcerated) -   Follow-on AV groove LCx gives off 3 additional posterior lateral branches distally.  ? Diffuse proximal to mid LAD 65% stenosis followed by normalization and then distal-apical tapering to 70% stenosis.  ? Small nondominant RCA   Normal EF and EDP.        RECOMMENDATIONS   Admit overnight, restart IV heparin   CVTS consultation (images reviewed with Dr. Clifton James.  Would be difficult PCI of the LCx with bifurcation lesion and then extensive LAD lesions.   Continue to titrate GUIDELINE DIRECTED MEDICAL THERAPY           Bryan Lemma, MD   2D echo 01/20/20   1. Left ventricular ejection fraction, by estimation, is 60 to 65%. The  left ventricle has normal function. The left ventricle demonstrates  regional wall motion abnormalities (see scoring diagram/findings for  description). Left ventricular diastolic  parameters are consistent with Grade I diastolic dysfunction (impaired  relaxation). There is mild hypokinesis of the left ventricular, basal  inferolateral wall.   2. Right ventricular systolic function is normal. The right ventricular  size is normal.   3. The mitral valve is normal in structure. No evidence of mitral valve  regurgitation. No evidence of mitral stenosis.   4. The aortic  valve is normal in structure. Aortic valve regurgitation is  not visualized. No aortic stenosis is present.   5. The inferior vena cava is normal in size with greater than 50%  respiratory variability, suggesting right atrial pressure of 3 mmHg.      EKG:  EKG is NOT ordered today.   Recent Labs: 10/11/2021: ALT 42 10/26/2021: BUN 27; Creatinine, Ser 1.26; Hemoglobin 12.0; Platelets 346; Potassium 5.4; Sodium 139  Recent Lipid Panel    Component Value Date/Time   CHOL 145 04/24/2021 0912   TRIG 75 04/24/2021 0912   HDL 70 04/24/2021 0912   CHOLHDL 2.1 04/24/2021 0912   CHOLHDL 3.3 01/20/2020 0737   VLDL 19 01/20/2020 0737   LDLCALC 60 04/24/2021 0912     Risk Assessment/Calculations:           Physical Exam:    VS:  BP 110/68   Pulse 79   Ht 5' 5.5" (1.664 m)   Wt 153 lb 6.4 oz (69.6 kg)   SpO2 97%   BMI 25.14 kg/m     Wt Readings from Last 3 Encounters:  11/09/21 153 lb 6.4 oz (69.6 kg)  10/26/21 150 lb 9.6 oz (68.3 kg)  10/10/21 178 lb (80.7 kg)     GEN:  Well nourished, well developed in no acute distress HEENT: Normal NECK: No JVD; No carotid bruits LYMPHATICS: No lymphadenopathy CARDIAC: RRR, no murmurs, rubs, gallops RESPIRATORY:  Clear to auscultation without rales, wheezing or rhonchi  ABDOMEN: Soft, non-tender, non-distended MUSCULOSKELETAL:  No edema; No deformity  SKIN: Warm and dry NEUROLOGIC:  Alert and oriented x 3 PSYCHIATRIC:  Normal affect   ASSESSMENT:    1. CAD in native artery   2. Essential hypertension   3. Hyperlipidemia LDL goal <70   4. Type 2 diabetes mellitus with hyperosmolar hyperglycemic state (HHS) (HCC)   5. Hyperkalemia    PLAN:    In order of problems listed above:  CAD s/p CABG: he has chronic stable angina. Continue on aspirin, statin BB and nitrates.   HTN: BP well controlled today. No changes made.  HLD: lipids checked 6 months ago and LDL 60. LFTs WNL but ALT mildly elevated.   Uncontrolled DMT2: recently  admitted with newly diagnosed type 2 diabetes with hyperosmolar hyperglycemic state. HgA1c 11.7. Continue follow up with PCP  Hyperkalemia: K high on recent labs- recheck BMET today. If it remains high, will reduce ACEi.   Medication Adjustments/Labs and Tests Ordered: Current medicines are reviewed at length with the patient today.  Concerns regarding medicines are outlined above.  Orders Placed This Encounter  Procedures   Basic metabolic panel   Meds ordered this encounter  Medications   metFORMIN (GLUCOPHAGE) 500 MG tablet    Sig: Take 1 tablet (500 mg total) by mouth 2 (two) times daily with a meal.    Dispense:  180 tablet    Refill:  2    Order Specific Question:   Supervising Provider    Answer:   COOPER, MICHAEL [[id number]]   glimepiride (AMARYL) 2 MG tablet    Sig: Take 1 tablet (2 mg total) by mouth every morning.    Dispense:  90 tablet    Refill:  2    Order Specific Question:   Supervising Provider    Answer:   COOPER, MICHAEL [[id number]]   isosorbide dinitrate (ISORDIL) 10 MG tablet    Sig: Take 1 tablet (10 mg total) by mouth 3 (three) times daily.    Dispense:  270 tablet    Refill:  2    Order Specific Question:   Supervising Provider    Answer:   Tonny Bollman [[id number]]    Patient Instructions  Medication Instructions:  Your physician recommends that you continue on your current medications as directed. Please refer to the Current Medication list given to you today.   *If you need a refill on your cardiac medications before your next appointment, please call your pharmacy*   Lab Work: BMP- today   If you have labs (blood work) drawn today and your tests are completely normal, you will receive your results only by: MyChart Message (if you have MyChart) OR A paper copy in the mail If you have any lab test that is abnormal or we need to change your treatment, we will call you to review the results.   Testing/Procedures: None ordered    Follow-Up: At [facility], you and your health needs are our priority.  As part of our continuing mission to provide you with exceptional heart care, we have created designated Provider Care Teams.  These Care Teams include your primary Cardiologist (physician) and Advanced Practice Providers (APPs -  Physician Assistants and Nurse Practitioners) who all work together to provide you with the care you need, when you need it.  We recommend signing up for the patient portal called "MyChart".  Sign up information is provided on this After Visit Summary.  MyChart is used to connect with patients for Virtual Visits (Telemedicine).  Patients are able to view lab/test results, encounter notes, upcoming appointments, etc.  Non-urgent messages can be sent to your provider as well.   To learn more about what you can do with MyChart, go to [email or web address].    Your next appointment:   9 month(s)  The format for your next appointment:   In Person  Provider:   Verne Carrow, MD     Other Instructions   Important Information About Sugar         Signed, Cline Crock, PA-C  11/09/2021 3:28 PM    [facility]

## 2021-11-09 ENCOUNTER — Encounter: Payer: Self-pay | Admitting: Physician Assistant

## 2021-11-09 ENCOUNTER — Ambulatory Visit (INDEPENDENT_AMBULATORY_CARE_PROVIDER_SITE_OTHER): Payer: Self-pay | Admitting: Physician Assistant

## 2021-11-09 VITALS — BP 110/68 | HR 79 | Ht 65.5 in | Wt 153.4 lb

## 2021-11-09 DIAGNOSIS — I2510 Atherosclerotic heart disease of native coronary artery without angina pectoris: Secondary | ICD-10-CM

## 2021-11-09 DIAGNOSIS — E875 Hyperkalemia: Secondary | ICD-10-CM

## 2021-11-09 DIAGNOSIS — I10 Essential (primary) hypertension: Secondary | ICD-10-CM

## 2021-11-09 DIAGNOSIS — E785 Hyperlipidemia, unspecified: Secondary | ICD-10-CM

## 2021-11-09 DIAGNOSIS — E1100 Type 2 diabetes mellitus with hyperosmolarity without nonketotic hyperglycemic-hyperosmolar coma (NKHHC): Secondary | ICD-10-CM

## 2021-11-09 LAB — BASIC METABOLIC PANEL
BUN/Creatinine Ratio: 21 (ref 10–24)
BUN: 21 mg/dL (ref 8–27)
CO2: 23 mmol/L (ref 20–29)
Calcium: 9 mg/dL (ref 8.6–10.2)
Chloride: 105 mmol/L (ref 96–106)
Creatinine, Ser: 1.01 mg/dL (ref 0.76–1.27)
Glucose: 131 mg/dL — ABNORMAL HIGH (ref 70–99)
Potassium: 4.6 mmol/L (ref 3.5–5.2)
Sodium: 141 mmol/L (ref 134–144)
eGFR: 81 mL/min/{1.73_m2} (ref >59)

## 2021-11-09 MED ORDER — GLIMEPIRIDE 2 MG PO TABS: 2.0000 mg | ORAL_TABLET | ORAL | 2 refills | Status: DC

## 2021-11-09 MED ORDER — ISOSORBIDE DINITRATE 10 MG PO TABS
10.0000 mg | ORAL_TABLET | Freq: Three times a day (TID) | ORAL | 2 refills | Status: DC
Start: 2021-11-09 — End: 2022-05-09

## 2021-11-09 MED ORDER — METFORMIN HCL 500 MG PO TABS: 500.0000 mg | ORAL_TABLET | Freq: Two times a day (BID) | ORAL | 2 refills | Status: DC

## 2021-11-13 ENCOUNTER — Encounter: Payer: Self-pay | Admitting: Nurse Practitioner

## 2022-01-20 ENCOUNTER — Other Ambulatory Visit: Payer: Self-pay | Admitting: Nurse Practitioner

## 2022-01-20 ENCOUNTER — Other Ambulatory Visit: Payer: Self-pay | Admitting: Physician Assistant

## 2022-01-20 DIAGNOSIS — I10 Essential (primary) hypertension: Secondary | ICD-10-CM

## 2022-01-25 ENCOUNTER — Other Ambulatory Visit: Payer: Self-pay | Admitting: Physician Assistant

## 2022-01-25 DIAGNOSIS — E785 Hyperlipidemia, unspecified: Secondary | ICD-10-CM

## 2022-01-25 DIAGNOSIS — I779 Disorder of arteries and arterioles, unspecified: Secondary | ICD-10-CM

## 2022-01-25 DIAGNOSIS — I10 Essential (primary) hypertension: Secondary | ICD-10-CM

## 2022-01-25 DIAGNOSIS — I4510 Unspecified right bundle-branch block: Secondary | ICD-10-CM

## 2022-01-25 DIAGNOSIS — I6529 Occlusion and stenosis of unspecified carotid artery: Secondary | ICD-10-CM

## 2022-01-26 ENCOUNTER — Encounter: Payer: Self-pay | Admitting: Nurse Practitioner

## 2022-01-26 ENCOUNTER — Other Ambulatory Visit: Payer: Self-pay | Admitting: Nurse Practitioner

## 2022-01-26 ENCOUNTER — Ambulatory Visit (INDEPENDENT_AMBULATORY_CARE_PROVIDER_SITE_OTHER): Payer: Self-pay | Admitting: Nurse Practitioner

## 2022-01-26 VITALS — BP 93/65 | HR 91 | Temp 97.2°F | Ht 65.5 in | Wt 158.0 lb

## 2022-01-26 DIAGNOSIS — E1100 Type 2 diabetes mellitus with hyperosmolarity without nonketotic hyperglycemic-hyperosmolar coma (NKHHC): Secondary | ICD-10-CM

## 2022-01-26 DIAGNOSIS — I10 Essential (primary) hypertension: Secondary | ICD-10-CM

## 2022-01-26 LAB — POCT GLYCOSYLATED HEMOGLOBIN (HGB A1C)
HbA1c POC (<> result, manual entry): 9.3 % (ref 4.0–5.6)
HbA1c, POC (controlled diabetic range): 9.3 % — AB (ref 0.0–7.0)
HbA1c, POC (prediabetic range): 9.3 % — AB (ref 5.7–6.4)
Hemoglobin A1C: 9.3 % — AB (ref 4.0–5.6)

## 2022-01-26 MED ORDER — METOPROLOL TARTRATE 25 MG PO TABS: 25.0000 mg | ORAL_TABLET | Freq: Two times a day (BID) | ORAL | 1 refills | Status: DC

## 2022-01-26 NOTE — Progress Notes (Signed)
@Patient  ID: Todd Gonzalez, male    DOB: [birth year], 66 y.o.   MRN: [id number]  Chief Complaint  Patient presents with   Follow-up    Pt is here for 3 month follow up visit.    Referring provider: [id number], NP   HPI  Todd Gonzalez is a 66 y.o. male with medical history significant of hypertension, hyperlipidemia, CAD s/p CABG, and diabetes.   Patient presents today for a diabetes follow up. He states that blood sugars at home have been around 126. A1C today was 9.3 down from 11.7 at last check. Will continue medication at current dosage. Denies f/c/s, n/v/d, hemoptysis, PND, leg swelling Denies chest pain or edema    No Known Allergies   There is no immunization history on file for this patient.  Past Medical History:  Diagnosis Date   CAD (coronary artery disease)    ruptured plaque in OM 2003, NSTEMI/CABG 01/2020   Headache(784.0)    HLD (hyperlipidemia)    HTN (hypertension)    Mild carotid artery disease (HCC)    Pre-diabetes    RBBB (right bundle branch block)     Tobacco History: Social History   Tobacco Use  Smoking Status Some Days   Packs/day: 1.00   Types: Cigarettes  Smokeless Tobacco Never  Tobacco Comments   1 pack every 4 days   Ready to quit: Not Answered Counseling given: Not Answered Tobacco comments: 1 pack every 4 days   Outpatient Encounter Medications as of 01/26/2022  Medication Sig   amLODipine (NORVASC) 10 MG tablet Take 10 mg by mouth daily. Take 1 tablet by mouth daily.   aspirin EC 81 MG tablet Take 1 tablet (81 mg total) by mouth daily. Swallow whole.   atorvastatin (LIPITOR) 40 MG tablet Take 1 tablet (40 mg total) by mouth daily.   glimepiride (AMARYL) 2 MG tablet Take 1 tablet (2 mg total) by mouth every morning.   isosorbide dinitrate (ISORDIL) 10 MG tablet Take 1 tablet (10 mg total) by mouth 3 (three) times daily.   metFORMIN (GLUCOPHAGE) 500 MG tablet Take 1 tablet (500 mg total) by mouth 2 (two) times daily  with a meal.   Multiple Vitamins-Minerals (MULTIVITAMIN WITH MINERALS) tablet Take 1 tablet by mouth daily.   naproxen sodium (ALEVE) 220 MG tablet Take 220-440 mg by mouth 2 (two) times daily as needed (pain).   nitroGLYCERIN (NITROSTAT) 0.4 MG SL tablet Place 1 tablet (0.4 mg total) under the tongue every 5 (five) minutes as needed for chest pain.   ondansetron (ZOFRAN) 4 MG tablet Take 1 tablet (4 mg total) by mouth every 6 (six) hours as needed for nausea.   prednisoLONE 5 MG TABS tablet Take 15 mg by mouth 2 (two) times daily as needed (knee pain).   [DISCONTINUED] metoprolol tartrate (LOPRESSOR) 25 MG tablet Take 1 tablet (25 mg total) by mouth 2 (two) times daily.   benazepril (LOTENSIN) 40 MG tablet Take 1 tablet (40 mg total) by mouth daily. Take 1 tablet by mouth daily.   metoprolol tartrate (LOPRESSOR) 25 MG tablet Take 1 tablet (25 mg total) by mouth 2 (two) times daily.   No facility-administered encounter medications on file as of 01/26/2022.     Review of Systems  Review of Systems  Constitutional: Negative.   HENT: Negative.    Cardiovascular: Negative.   Gastrointestinal: Negative.   Allergic/Immunologic: Negative.   Neurological: Negative.   Psychiatric/Behavioral: Negative.  Physical Exam  BP 93/65 (BP Location: Left Arm, Patient Position: Sitting, Cuff Size: Normal)   Pulse 91   Temp (!) 97.2 F (36.2 C)   Ht 5' 5.5" (1.664 m)   Wt 158 lb (71.7 kg)   BMI 25.89 kg/m   Wt Readings from Last 5 Encounters:  01/26/22 158 lb (71.7 kg)  11/09/21 153 lb 6.4 oz (69.6 kg)  10/26/21 150 lb 9.6 oz (68.3 kg)  10/10/21 178 lb (80.7 kg)  04/24/21 167 lb (75.8 kg)     Physical Exam Vitals and nursing note reviewed.  Constitutional:      General: He is not in acute distress.    Appearance: He is well-developed.  Cardiovascular:     Rate and Rhythm: Normal rate and regular rhythm.  Pulmonary:     Effort: Pulmonary effort is normal.     Breath sounds:  Normal breath sounds.  Skin:    General: Skin is warm and dry.  Neurological:     Mental Status: He is alert and oriented to person, place, and time.      Lab Results:  CBC    Component Value Date/Time   WBC 14.7 (H) 10/26/2021 1406   WBC 6.9 10/11/2021 0319   RBC 4.10 (L) 10/26/2021 1406   RBC 4.39 10/11/2021 0319   HGB 12.0 (L) 10/26/2021 1406   HCT 35.5 (L) 10/26/2021 1406   PLT 346 10/26/2021 1406   MCV 87 10/26/2021 1406   MCH 29.3 10/26/2021 1406   MCH 29.6 10/11/2021 0319   MCHC 33.8 10/26/2021 1406   MCHC 34.9 10/11/2021 0319   RDW 15.9 (H) 10/26/2021 1406   LYMPHSABS 2.2 10/10/2021 0925   MONOABS 0.6 10/10/2021 0925   EOSABS 0.1 10/10/2021 0925   BASOSABS 0.1 10/10/2021 0925    BMET    Component Value Date/Time   NA 141 11/09/2021 1133   K 4.6 11/09/2021 1133   CL 105 11/09/2021 1133   CO2 23 11/09/2021 1133   GLUCOSE 131 (H) 11/09/2021 1133   GLUCOSE 113 (H) 10/11/2021 0319   BUN 21 11/09/2021 1133   CREATININE 1.01 11/09/2021 1133   CALCIUM 9.0 11/09/2021 1133   GFRNONAA >60 10/11/2021 0319   GFRAA 99 05/09/2020 1059    BNP No results found for: "BNP"  ProBNP No results found for: "PROBNP"  Imaging: No results found.   Assessment & Plan:   Type 2 diabetes mellitus with hyperosmolar hyperglycemic state (HHS) (HCC) - POCT glycosylated hemoglobin (Hb A1C) - CBC - Comprehensive metabolic panel  2. Essential hypertension  - metoprolol tartrate (LOPRESSOR) 25 MG tablet; Take 1 tablet (25 mg total) by mouth 2 (two) times daily.  Dispense: 60 tablet; Refill: 1 - CBC - Comprehensive metabolic panel  Follow up:  Follow up in 3 months      Todd Andrew, NP 01/26/2022

## 2022-01-26 NOTE — Patient Instructions (Signed)
1. Type 2 diabetes mellitus with hyperosmolar hyperglycemic state (HHS) (HCC)  - POCT glycosylated hemoglobin (Hb A1C) - CBC - Comprehensive metabolic panel  2. Essential hypertension  - metoprolol tartrate (LOPRESSOR) 25 MG tablet; Take 1 tablet (25 mg total) by mouth 2 (two) times daily.  Dispense: 60 tablet; Refill: 1 - CBC - Comprehensive metabolic panel  Follow up:  Follow up in 3 months

## 2022-01-26 NOTE — Assessment & Plan Note (Signed)
-   POCT glycosylated hemoglobin (Hb A1C) - CBC - Comprehensive metabolic panel  2. Essential hypertension  - metoprolol tartrate (LOPRESSOR) 25 MG tablet; Take 1 tablet (25 mg total) by mouth 2 (two) times daily.  Dispense: 60 tablet; Refill: 1 - CBC - Comprehensive metabolic panel  Follow up:  Follow up in 3 months

## 2022-01-27 LAB — COMPREHENSIVE METABOLIC PANEL
ALT: 26 IU/L (ref 0–44)
AST: 24 IU/L (ref 0–40)
Albumin/Globulin Ratio: 1.4 (ref 1.2–2.2)
Albumin: 3.9 g/dL (ref 3.9–4.9)
Alkaline Phosphatase: 63 IU/L (ref 44–121)
BUN/Creatinine Ratio: 21 (ref 10–24)
BUN: 24 mg/dL (ref 8–27)
Bilirubin Total: 0.3 mg/dL (ref 0.0–1.2)
CO2: 19 mmol/L — ABNORMAL LOW (ref 20–29)
Calcium: 9.1 mg/dL (ref 8.6–10.2)
Chloride: 106 mmol/L (ref 96–106)
Creatinine, Ser: 1.17 mg/dL (ref 0.76–1.27)
Globulin, Total: 2.7 g/dL (ref 1.5–4.5)
Glucose: 87 mg/dL (ref 70–99)
Potassium: 4.8 mmol/L (ref 3.5–5.2)
Sodium: 143 mmol/L (ref 134–144)
Total Protein: 6.6 g/dL (ref 6.0–8.5)
eGFR: 68 mL/min/{1.73_m2} (ref >59)

## 2022-01-27 LAB — CBC
Hematocrit: 34.8 % — ABNORMAL LOW (ref 37.5–51.0)
Hemoglobin: 11.9 g/dL — ABNORMAL LOW (ref 13.0–17.7)
MCH: 31.1 pg (ref 26.6–33.0)
MCHC: 34.2 g/dL (ref 31.5–35.7)
MCV: 91 fL (ref 79–97)
Platelets: 251 10*3/uL (ref 150–450)
RBC: 3.83 x10E6/uL — ABNORMAL LOW (ref 4.14–5.80)
RDW: 13.1 % (ref 11.6–15.4)
WBC: 8.2 10*3/uL (ref 3.4–10.8)

## 2022-01-30 ENCOUNTER — Other Ambulatory Visit: Payer: Self-pay | Admitting: Pharmacist

## 2022-01-30 NOTE — Progress Notes (Signed)
Chief Complaint  Patient presents with   Hypertension   Diabetes   Medication Management   Hyperlipidemia    Todd Gonzalez is a 66 y.o. year old male who presented for a telephone visit.   They were referred to the pharmacist by their PCP for assistance in managing diabetes and hypertension.   Subjective:  Care Team: Primary Care Provider: Ivonne Andrew, NP ; Next Scheduled Visit: 04/27/22  Medication Access/Adherence  Current Pharmacy:  [id number] - [address] ([place]), [place] - [address] [place] ([place]) [place] [address] Phone: [phone] Fax: [phone]  [facility] [address] [place] [address] Phone: [phone] Fax: [phone]   Patient reports affordability concerns with their medications: No  Patient reports access/transportation concerns to their pharmacy: No  Patient reports adherence concerns with their medications:  No     Diabetes:  Current medications: metformin 500 mg twice daily - though reports today he is taking once daily; glimepiride 2 mg daily  Current glucose readings: reports fasting readings yesterday was 126   Patient reports hypoglycemic s/sx including dizziness, shakiness, sweating. Reports he is more likely to feel this way when he is hungry.   Hypertension:  Current medications: amlodipine 10 mg daily, benazepril 40 mg daily, isosorbide 10 mg three times daily, metoprolol tartrate 25 mg twice daily  Patient has a home BP cuff, unclear what type. He reports he has not been checking readings recently.    Hyperlipidemia/ASCVD Risk Reduction  Current lipid lowering medications: atorvastatin 40 mg daily  Antiplatelet regimen: aspirin 81 mg daily   Health Maintenance  Health Maintenance Due  Topic Date Due   COVID-19 Vaccine (1) Never done   Pneumonia Vaccine 53+ Years old (1 - PCV) Never done   FOOT EXAM  Never done   OPHTHALMOLOGY EXAM   Never done   URINE MICROALBUMIN  Never done   Hepatitis C Screening  Never done   TETANUS/TDAP  Never done   Zoster Vaccines- Shingrix (1 of 2) Never done   COLONOSCOPY (Pts 45-50yrs Insurance coverage will need to be confirmed)  Never done   INFLUENZA VACCINE  Never done     Objective: Lab Results  Component Value Date   HGBA1C 9.3 (A) 01/26/2022   HGBA1C 9.3 01/26/2022   HGBA1C 9.3 (A) 01/26/2022   HGBA1C 9.3 (A) 01/26/2022    Lab Results  Component Value Date   CREATININE 1.17 01/26/2022   BUN 24 01/26/2022   NA 143 01/26/2022   K 4.8 01/26/2022   CL 106 01/26/2022   CO2 19 (L) 01/26/2022    Lab Results  Component Value Date   CHOL 145 04/24/2021   HDL 70 04/24/2021   LDLCALC 60 04/24/2021   TRIG 75 04/24/2021   CHOLHDL 2.1 04/24/2021    Medications Reviewed Today     Reviewed by Alden Hipp, RPH-CPP (Pharmacist) on 01/30/22 at 1140  Med List Status: <None>   Medication Order Taking? Sig Documenting Provider Last Dose Status Informant  amLODipine (NORVASC) 10 MG tablet [id number] Yes Take 10 mg by mouth daily. Take 1 tablet by mouth daily. [provider] Taking Active Self  aspirin EC 81 MG tablet [id number] Yes Take 1 tablet (81 mg total) by mouth daily. Swallow whole. Furth, Cadence H, PA-C Taking Active Self  atorvastatin (LIPITOR) 40 MG tablet [id number] Yes Take 1 tablet (40 mg total) by mouth daily. Rodolph Bong, MD Taking Active  benazepril (LOTENSIN) 40 MG tablet [id number] Yes Take 1 tablet (40 mg total) by mouth daily. Take 1 tablet by mouth daily. Ivonne Andrew, NP Taking Active   glimepiride (AMARYL) 2 MG tablet [id number] Yes Take 1 tablet (2 mg total) by mouth every morning. Janetta Hora, PA-C Taking Active   isosorbide dinitrate (ISORDIL) 10 MG tablet [id number] Yes Take 1 tablet (10 mg total) by mouth 3 (three) times daily. Janetta Hora, PA-C Taking Active   metFORMIN (GLUCOPHAGE) 500 MG tablet [id number] Yes Take 1  tablet (500 mg total) by mouth 2 (two) times daily with a meal. Janetta Hora, PA-C Taking Active            Med Note Clearance Coots, Catarino Vold T   Tue Jan 30, 2022 11:35 AM) Taking once daily  metoprolol tartrate (LOPRESSOR) 25 MG tablet [id number] Yes Take 1 tablet (25 mg total) by mouth 2 (two) times daily. Ivonne Andrew, NP Taking Active   Multiple Vitamins-Minerals (MULTIVITAMIN WITH MINERALS) tablet [id number] Yes Take 1 tablet by mouth daily. [provider] Taking Active Self  naproxen sodium (ALEVE) 220 MG tablet [id number] Yes Take 220-440 mg by mouth 2 (two) times daily as needed (pain). [provider] Taking Active Self  nitroGLYCERIN (NITROSTAT) 0.4 MG SL tablet [id number] No Place 1 tablet (0.4 mg total) under the tongue every 5 (five) minutes as needed for chest pain.  Patient not taking: Reported on 01/30/2022   Rodolph Bong, MD Not Taking Active   ondansetron [facility]) 4 MG tablet [id number] No Take 1 tablet (4 mg total) by mouth every 6 (six) hours as needed for nausea.  Patient not taking: Reported on 01/30/2022   Rodolph Bong, MD Not Taking Active               Assessment/Plan:   Diabetes: - Currently uncontrolled - Reporting symptoms of hypoglycemic. Recommend to discontinue glimepiride, increase metformin to twice daily as prescribed. Given language barriers, will avoid titration at this time. Plan to titrate metformin and/or add SGLT2 given CAD. - Recommend to check glucose twice daily, fasting and 2 hour post prandial  Hypertension: - Currently controlled - Reviewed long term cardiovascular and renal outcomes of uncontrolled blood pressure - Reviewed appropriate blood pressure monitoring technique and reviewed goal blood pressure. Recommended to check home blood pressure and heart rate daily - Recommend to continue current regimen at this time until home readings are available.   Hyperlipidemia/ASCVD Risk Reduction: - Currently  controlled.  - Recommend to continue current regimen   Follow Up Plan: phone call in 2 weeks  Catie Eppie Gibson, PharmD, [facility] [phone]

## 2022-01-30 NOTE — Patient Instructions (Addendum)
Todd Gonzalez,   It was great talking to you today!  Please stop glimepiride.   Increase metformin to twice daily, as written on the bottle.   Check your blood sugars twice daily:  1) Fasting, first thing in the morning before breakfast and  2) 2 hours after your largest meal.   Check your blood pressure once daily, and any time you have concerning symptoms like headache, chest pain, dizziness, shortness of breath, or vision changes.   Our goal is less than 130/80.  To check your blood pressure, make sure you do the following:  1) Avoid caffeine, exercise, or tobacco products for 30 minutes before checking. Empty your bladder. 2) Sit with your back supported in a flat-backed chair. Rest your arm on something flat (arm of the chair, table, etc). 3) Sit still with your feet flat on the floor, resting, for at least 5 minutes.  4) Check your blood pressure. Take 1-2 readings.  5) Write down these readings and bring with you to any provider appointments.  Please write down your blood pressures and blood sugars. We will review them when we talk.   Thanks!  Catie Eppie Gibson, PharmD, [facility] [phone]

## 2022-01-31 ENCOUNTER — Ambulatory Visit (HOSPITAL_COMMUNITY): Payer: Self-pay

## 2022-02-06 ENCOUNTER — Ambulatory Visit (HOSPITAL_COMMUNITY)
Admission: RE | Admit: 2022-02-06 | Discharge: 2022-02-06 | Disposition: A | Discharge status: Home / Self Care | Payer: Self-pay | Source: Ambulatory Visit | Attending: Cardiology | Admitting: Cardiology

## 2022-02-06 DIAGNOSIS — I6529 Occlusion and stenosis of unspecified carotid artery: Secondary | ICD-10-CM

## 2022-02-06 DIAGNOSIS — E785 Hyperlipidemia, unspecified: Secondary | ICD-10-CM

## 2022-02-06 DIAGNOSIS — I779 Disorder of arteries and arterioles, unspecified: Secondary | ICD-10-CM

## 2022-02-06 DIAGNOSIS — I4510 Unspecified right bundle-branch block: Secondary | ICD-10-CM

## 2022-02-06 DIAGNOSIS — I10 Essential (primary) hypertension: Secondary | ICD-10-CM

## 2022-02-12 ENCOUNTER — Other Ambulatory Visit: Payer: Self-pay | Admitting: Pharmacist

## 2022-02-12 VITALS — BP 101/64 | HR 76

## 2022-02-12 DIAGNOSIS — I10 Essential (primary) hypertension: Secondary | ICD-10-CM

## 2022-02-12 MED ORDER — AMLODIPINE BESYLATE 5 MG PO TABS: 5.0000 mg | ORAL_TABLET | Freq: Every day | ORAL | 1 refills | Status: DC

## 2022-02-12 NOTE — Progress Notes (Signed)
Chief Complaint  Patient presents with   Diabetes   Hypertension   Medication Management    Todd Gonzalez is a 66 y.o. year old male who presented for a telephone visit.   They were referred to the pharmacist by their PCP for assistance in managing diabetes.    Subjective:  Care Team: Primary Care Provider: Fenton Foy, NP ; Next Scheduled Visit: 04/27/22  Medication Access/Adherence  Current Pharmacy:  [facility] ([place]), [place] - [address] [place]) [place] [address] Phone: [phone] Fax: [place] [address] [place] [address] Phone: [phone] Fax: [phone]   Patient reports affordability concerns with their medications: No  Patient reports access/transportation concerns to their pharmacy: No  Patient reports adherence concerns with their medications:  No     Diabetes:   Current medications: metformin 500 mg twice daily  Current glucose readings: 80-110s  Patient reports resolution of symptoms of hypoglycemia with discontinuing the glimepiride  Hypertension:   Current medications: amlodipine 10 mg daily, benazepril 40 mg daily, isosorbide 10 mg three times daily, metoprolol tartrate 25 mg twice daily   Patient has a home BP cuff, unclear what type. He checked while we were on the phone today: 101/64; HR 76, repeat was the same.      Hyperlipidemia/ASCVD Risk Reduction   Current lipid lowering medications: atorvastatin 40 mg daily   Antiplatelet regimen: aspirin 81 mg daily    Health Maintenance  Health Maintenance Due  Topic Date Due   COVID-19 Vaccine (1) Never done   Pneumonia Vaccine 55+ Years old (1 - PCV) Never done   FOOT EXAM  Never done   OPHTHALMOLOGY EXAM  Never done   Diabetic kidney evaluation - Urine ACR  Never done   Hepatitis C Screening  Never done   TETANUS/TDAP  Never done   Zoster Vaccines- Shingrix  (1 of 2) Never done   COLONOSCOPY (Pts 45-59yrs Insurance coverage will need to be confirmed)  Never done   INFLUENZA VACCINE  Never done     Objective: Lab Results  Component Value Date   HGBA1C 9.3 (A) 01/26/2022   HGBA1C 9.3 01/26/2022   HGBA1C 9.3 (A) 01/26/2022   HGBA1C 9.3 (A) 01/26/2022    Lab Results  Component Value Date   CREATININE 1.17 01/26/2022   BUN 24 01/26/2022   NA 143 01/26/2022   K 4.8 01/26/2022   CL 106 01/26/2022   CO2 19 (L) 01/26/2022    Lab Results  Component Value Date   CHOL 145 04/24/2021   HDL 70 04/24/2021   LDLCALC 60 04/24/2021   TRIG 75 04/24/2021   CHOLHDL 2.1 04/24/2021    Medications Reviewed Today     Reviewed by Osker Mason, RPH-CPP (Pharmacist) on 02/12/22 at 1008  Med List Status: <None>   Medication Order Taking? Sig Documenting Provider Last Dose Status Informant  amLODipine (NORVASC) 10 MG tablet [id number] Yes Take 10 mg by mouth daily. Take 1 tablet by mouth daily. [provider] Taking Active Self  aspirin EC 81 MG tablet [id number] Yes Take 1 tablet (81 mg total) by mouth daily. Swallow whole. Furth, Cadence H, PA-C Taking Active Self  atorvastatin (LIPITOR) 40 MG tablet [id number] Yes Take 1 tablet (40 mg total) by mouth daily. Eugenie Filler, MD Taking Active   benazepril (LOTENSIN) 40 MG tablet [id number] Yes Take 1 tablet (40 mg total) by  mouth daily. Take 1 tablet by mouth daily. Fenton Foy, NP Taking Active   isosorbide dinitrate (ISORDIL) 10 MG tablet [id number] Yes Take 1 tablet (10 mg total) by mouth 3 (three) times daily. Eileen Stanford, PA-C Taking Active   metFORMIN (GLUCOPHAGE) 500 MG tablet [id number] Yes Take 1 tablet (500 mg total) by mouth 2 (two) times daily with a meal. Eileen Stanford, PA-C Taking Active            Med Note Jodi Mourning, Tivon Lemoine T   Mon Feb 12, 2022 10:02 AM)    metoprolol tartrate (LOPRESSOR) 25 MG tablet [id number] Yes Take 1 tablet (25 mg total) by mouth 2  (two) times daily. Fenton Foy, NP Taking Active   Multiple Vitamins-Minerals (MULTIVITAMIN WITH MINERALS) tablet [id number]  Take 1 tablet by mouth daily. [provider]  Active Self  naproxen sodium (ALEVE) 220 MG tablet [id number]  Take 220-440 mg by mouth 2 (two) times daily as needed (pain). [provider]  Active Self  nitroGLYCERIN (NITROSTAT) 0.4 MG SL tablet [id number]  Place 1 tablet (0.4 mg total) under the tongue every 5 (five) minutes as needed for chest pain.  Patient not taking: Reported on 01/30/2022   Eugenie Filler, MD  Active   ondansetron [facility]) 4 MG tablet [id number]  Take 1 tablet (4 mg total) by mouth every 6 (six) hours as needed for nausea.  Patient not taking: Reported on 01/30/2022   Eugenie Filler, MD  Active               Assessment/Plan:   Diabetes: - Currently uncontrolled but likely improving - Reviewed goal A1c, goal fasting, and goal 2 hour post prandial glucose - Recommend to continue current regimen.   - Recommend to check glucose twice daily, incorporating post prandial checks. Patient verbalized understanding  Hypertension: - Currently controlled, slightly - Reviewed long term cardiovascular and renal outcomes of uncontrolled blood pressure - Reviewed appropriate blood pressure monitoring technique and reviewed goal blood pressure. Recommended to check home blood pressure and heart rate daily - Given low normal BP, recommend to reduce amlodipine to 5 mg daily. Discussed with PCP, she is in agreement. Order sent to [facility]. Patient verbalizes understanding - Recommend to continue benazepril, isosorbide dinitrate, metoprolol tartrate.   Hyperlipidemia/ASCVD Risk Reduction: - Currently controlled.  - Recommend to continue current regimen at this time  Follow Up Plan: phone call in 2 weeks  Catie TJodi Mourning, PharmD, [facility] [phone]

## 2022-02-26 ENCOUNTER — Other Ambulatory Visit: Payer: Self-pay | Admitting: Pharmacist

## 2022-02-26 ENCOUNTER — Telehealth: Payer: Self-pay | Admitting: Pharmacist

## 2022-02-26 NOTE — Progress Notes (Unsigned)
Attempted to contact patient for scheduled appointment for medication management. Left HIPAA compliant message for patient to return my call at their convenience.   Catie T. Ellison Rieth, PharmD, BCACP [facility] [phone]  

## 2022-03-05 ENCOUNTER — Telehealth: Payer: Self-pay

## 2022-03-05 NOTE — Chronic Care Management (AMB) (Signed)
   Care Guide Note  03/05/2022 Name: Todd Gonzalez MRN: [id number] DOB: 05/24/1955  Referred by: Fenton Foy, NP Reason for referral : Care Coordination (Outreach to reschedule missed f/u with Pharm D Catie )   Todd Gonzalez is a 66 y.o. year old male who is a primary care patient of Fenton Foy, NP. [name] was referred to the pharmacist for assistance related to DM.    An unsuccessful telephone outreach was attempted today to contact the patient who was referred to the pharmacy team for assistance with medication management. Additional attempts will be made to contact the patient.   Noreene Larsson, [place], [place] [address] Direct Dial: [phone] [email or web address]

## 2022-03-14 NOTE — Chronic Care Management (AMB) (Signed)
   Care Guide Note  03/14/2022 Name: Todd Gonzalez MRN: [id number] DOB: 15-Feb-1956  Referred by: Fenton Foy, NP Reason for referral : Care Coordination (Outreach to reschedule missed f/u with Pharm D Catie )   Todd Gonzalez is a 66 y.o. year old male who is a primary care patient of Fenton Foy, NP. Todd Gonzalez was referred to the pharmacist for assistance related to DM.    Successful contact was made with the patient to discuss pharmacy services including being ready for the pharmacist to call at least 5 minutes before the scheduled appointment time, to have medication bottles and any blood sugar or blood pressure readings ready for review. The patient agreed to meet with the pharmacist via with the pharmacist via telephone visit on 03/30/2022.    Noreene Larsson, [place], [place] [address] Direct Dial: [phone] [email or web address]

## 2022-03-14 NOTE — Telephone Encounter (Signed)
Appt 03/30/2022 @9am 

## 2022-03-22 ENCOUNTER — Telehealth: Payer: Self-pay

## 2022-03-22 NOTE — Chronic Care Management (AMB) (Signed)
   Care Guide Note  03/22/2022 Name: Todd Gonzalez MRN: [id number] DOB: [birth year]  Referred by: Fenton Foy, NP Reason for referral : Care Coordination (Outreach to reschedule f/u with Pharm D due to meeting )   Todd Gonzalez is a 66 y.o. year old male who is a primary care patient of Fenton Foy, NP. Todd Gonzalez was referred to the pharmacist for assistance related to DM.    An unsuccessful telephone outreach was attempted today to contact the patient to reschedule follow up with pharmacy team. Additional attempts will be made to contact the patient.   Todd Gonzalez, Todd Gonzalez, Todd Gonzalez Direct Dial: [phone] [email or web address]

## 2022-03-25 ENCOUNTER — Other Ambulatory Visit: Payer: Self-pay | Admitting: Nurse Practitioner

## 2022-03-25 DIAGNOSIS — I10 Essential (primary) hypertension: Secondary | ICD-10-CM

## 2022-03-30 ENCOUNTER — Other Ambulatory Visit: Payer: Self-pay | Admitting: Pharmacist

## 2022-03-30 NOTE — Progress Notes (Signed)
   Care Guide Note  04/11/2022 Name: JES COSTALES MRN: [id number] DOB: 08/18/1955  Referred by: Ivonne Andrew, NP Reason for referral : Care Coordination (Outreach to reschedule f/u with Pharm D due to meeting )   MARCELINO CAMPOS is a 66 y.o. year old male who is a primary care patient of Ivonne Andrew, NP. Doreene Burke Mcgovern was referred to the pharmacist for assistance related to DM.    Successful contact was made with the patient to discuss pharmacy services including being ready for the pharmacist to call at least 5 minutes before the scheduled appointment time, to have medication bottles and any blood sugar or blood pressure readings ready for review. The patient agreed to meet with the pharmacist via with the pharmacist via telephone visit on (date/time).  05/07/2022  SIG  Care Guide Note  03/30/2022 Name: JERZY ROEPKE MRN: [id number] DOB: 06/30/1955  Referred by: Ivonne Andrew, NP Reason for referral : Care Coordination (Outreach to reschedule f/u with Pharm D due to meeting )   NEWELL WAFER is a 66 y.o. year old male who is a primary care patient of Ivonne Andrew, NP. Doreene Burke Puff was referred to the pharmacist for assistance related to DM.    A second unsuccessful telephone outreach was attempted today to contact the patient who was referred to the pharmacy team for assistance with medication management. Additional attempts will be made to contact the patient.  Penne Lash, RMA Care Guide [facility] [place], [place] [address] Direct Dial: [phone] [email or web address]

## 2022-04-27 ENCOUNTER — Encounter: Payer: Self-pay | Admitting: Nurse Practitioner

## 2022-04-27 ENCOUNTER — Other Ambulatory Visit: Payer: Self-pay

## 2022-04-27 ENCOUNTER — Ambulatory Visit (INDEPENDENT_AMBULATORY_CARE_PROVIDER_SITE_OTHER): Payer: Self-pay | Admitting: Nurse Practitioner

## 2022-04-27 DIAGNOSIS — I10 Essential (primary) hypertension: Secondary | ICD-10-CM

## 2022-04-27 LAB — POCT GLYCOSYLATED HEMOGLOBIN (HGB A1C): Hemoglobin A1C: 9 % — AB (ref 4.0–5.6)

## 2022-04-27 MED ORDER — BENAZEPRIL HCL 40 MG PO TABS
40.0000 mg | ORAL_TABLET | Freq: Every day | ORAL | 2 refills | Status: DC
  Filled 2022-04-27: qty 30, 30d supply, fill #0

## 2022-04-27 MED ORDER — ATORVASTATIN CALCIUM 20 MG PO TABS
40.0000 mg | ORAL_TABLET | Freq: Every day | ORAL | 1 refills | Status: DC
  Filled 2022-04-27: qty 120, 60d supply, fill #0

## 2022-04-27 MED ORDER — METOPROLOL TARTRATE 25 MG PO TABS
25.0000 mg | ORAL_TABLET | Freq: Two times a day (BID) | ORAL | 1 refills | Status: DC
  Filled 2022-04-27: qty 120, 60d supply, fill #0

## 2022-04-27 MED ORDER — METFORMIN HCL 500 MG PO TABS
500.0000 mg | ORAL_TABLET | Freq: Two times a day (BID) | ORAL | 2 refills | Status: DC
  Filled 2022-04-27: qty 180, 90d supply, fill #0

## 2022-04-27 NOTE — Assessment & Plan Note (Signed)
-   metoprolol tartrate (LOPRESSOR) 25 MG tablet; Take 1 tablet (25 mg total) by mouth 2 (two) times daily.  Dispense: 60 tablet; Refill: 1 - benazepril (LOTENSIN) 40 MG tablet; Take 1 tablet (40 mg total) by mouth daily. Take 1 tablet by mouth daily.  Dispense: 30 tablet; Refill: 2 - POCT glycosylated hemoglobin (Hb A1C)  Follow up:  Follow up in 3 months or sooner if needed

## 2022-04-27 NOTE — Patient Instructions (Signed)
1. Essential hypertension  - metoprolol tartrate (LOPRESSOR) 25 MG tablet; Take 1 tablet (25 mg total) by mouth 2 (two) times daily.  Dispense: 60 tablet; Refill: 1 - benazepril (LOTENSIN) 40 MG tablet; Take 1 tablet (40 mg total) by mouth daily. Take 1 tablet by mouth daily.  Dispense: 30 tablet; Refill: 2 - POCT glycosylated hemoglobin (Hb A1C)  Follow up:  Follow up in 3 months or sooner if needed

## 2022-04-27 NOTE — Progress Notes (Signed)
@Patient  ID: , male    DOB: 1955-07-29, 66 y.o.   MRN: [id number]  Chief Complaint  Patient presents with   Diabetes    3 months follow-up    Referring provider: [id number], NP   HPI  Todd Gonzalez is a 66 y.o. male with medical history significant of hypertension, hyperlipidemia, CAD s/p CABG, and diabetes.     Patient presents today for a diabetes follow up. He states that blood sugars at home have been around 126. A1C today was 9.0 down from 9.3 at last check. Will continue medication at current dosage.  Unfortunately patient states that he has lost his job and insurance.  Will look into getting assistance for his medications.  Patient was given groceries today from the food pantry.  Denies f/c/s, n/v/d, hemoptysis, PND, leg swelling Denies chest pain or edema      No Known Allergies   There is no immunization history on file for this patient.  Past Medical History:  Diagnosis Date   CAD (coronary artery disease)    ruptured plaque in OM 2003, NSTEMI/CABG 01/2020   Headache(784.0)    HLD (hyperlipidemia)    HTN (hypertension)    Mild carotid artery disease (HCC)    Pre-diabetes    RBBB (right bundle branch block)     Tobacco History: Social History   Tobacco Use  Smoking Status Some Days   Packs/day: 1.00   Types: Cigarettes  Smokeless Tobacco Never  Tobacco Comments   1 pack every 4 days   Ready to quit: Not Answered Counseling given: Not Answered Tobacco comments: 1 pack every 4 days   Outpatient Encounter Medications as of 04/27/2022  Medication Sig   amLODipine (NORVASC) 5 MG tablet Take 1 tablet (5 mg total) by mouth daily.   aspirin EC 81 MG tablet Take 1 tablet (81 mg total) by mouth daily. Swallow whole.   isosorbide dinitrate (ISORDIL) 10 MG tablet Take 1 tablet (10 mg total) by mouth 3 (three) times daily.   Multiple Vitamins-Minerals (MULTIVITAMIN WITH MINERALS) tablet Take 1 tablet by mouth daily.   naproxen sodium  (ALEVE) 220 MG tablet Take 220-440 mg by mouth 2 (two) times daily as needed (pain).   [DISCONTINUED] atorvastatin (LIPITOR) 40 MG tablet Take 1 tablet (40 mg total) by mouth daily.   [DISCONTINUED] benazepril (LOTENSIN) 40 MG tablet Take 1 tablet (40 mg total) by mouth daily. Take 1 tablet by mouth daily.   [DISCONTINUED] metFORMIN (GLUCOPHAGE) 500 MG tablet Take 1 tablet (500 mg total) by mouth 2 (two) times daily with a meal.   [DISCONTINUED] metoprolol tartrate (LOPRESSOR) 25 MG tablet Take 1 tablet (25 mg total) by mouth 2 (two) times daily.   atorvastatin (LIPITOR) 20 MG tablet Take 2 tablets (40 mg total) by mouth daily.   benazepril (LOTENSIN) 40 MG tablet Take 1 tablet (40 mg total) by mouth daily. Take 1 tablet by mouth daily.   metFORMIN (GLUCOPHAGE) 500 MG tablet Take 1 tablet (500 mg total) by mouth 2 (two) times daily with a meal.   metoprolol tartrate (LOPRESSOR) 25 MG tablet Take 1 tablet (25 mg total) by mouth 2 (two) times daily.   nitroGLYCERIN (NITROSTAT) 0.4 MG SL tablet Place 1 tablet (0.4 mg total) under the tongue every 5 (five) minutes as needed for chest pain. (Patient not taking: Reported on 01/30/2022)   ondansetron (ZOFRAN) 4 MG tablet Take 1 tablet (4 mg total) by mouth every 6 (six) hours as needed  for nausea. (Patient not taking: Reported on 01/30/2022)   No facility-administered encounter medications on file as of 04/27/2022.     Review of Systems  Review of Systems  Constitutional: Negative.   HENT: Negative.    Cardiovascular: Negative.   Gastrointestinal: Negative.   Allergic/Immunologic: Negative.   Neurological: Negative.   Psychiatric/Behavioral: Negative.         Physical Exam  BP 122/63   Pulse 89   Ht 5' 5.5" (1.664 m)   Wt 158 lb (71.7 kg)   SpO2 99%   BMI 25.89 kg/m   Wt Readings from Last 5 Encounters:  04/27/22 158 lb (71.7 kg)  01/26/22 158 lb (71.7 kg)  11/09/21 153 lb 6.4 oz (69.6 kg)  10/26/21 150 lb 9.6 oz (68.3 kg)   10/10/21 178 lb (80.7 kg)     Physical Exam Vitals and nursing note reviewed.  Constitutional:      General: He is not in acute distress.    Appearance: He is well-developed.  Cardiovascular:     Rate and Rhythm: Normal rate and regular rhythm.  Pulmonary:     Effort: Pulmonary effort is normal.     Breath sounds: Normal breath sounds.  Skin:    General: Skin is warm and dry.  Neurological:     Mental Status: He is alert and oriented to person, place, and time.      Lab Results:  CBC    Component Value Date/Time   WBC 8.2 01/26/2022 1034   WBC 6.9 10/11/2021 0319   RBC 3.83 (L) 01/26/2022 1034   RBC 4.39 10/11/2021 0319   HGB 11.9 (L) 01/26/2022 1034   HCT 34.8 (L) 01/26/2022 1034   PLT 251 01/26/2022 1034   MCV 91 01/26/2022 1034   MCH 31.1 01/26/2022 1034   MCH 29.6 10/11/2021 0319   MCHC 34.2 01/26/2022 1034   MCHC 34.9 10/11/2021 0319   RDW 13.1 01/26/2022 1034   LYMPHSABS 2.2 10/10/2021 0925   MONOABS 0.6 10/10/2021 0925   EOSABS 0.1 10/10/2021 0925   BASOSABS 0.1 10/10/2021 0925    BMET    Component Value Date/Time   NA 143 01/26/2022 1034   K 4.8 01/26/2022 1034   CL 106 01/26/2022 1034   CO2 19 (L) 01/26/2022 1034   GLUCOSE 87 01/26/2022 1034   GLUCOSE 113 (H) 10/11/2021 0319   BUN 24 01/26/2022 1034   CREATININE 1.17 01/26/2022 1034   CALCIUM 9.1 01/26/2022 1034   GFRNONAA >60 10/11/2021 0319   GFRAA 99 05/09/2020 1059     Assessment & Plan:   Essential hypertension - metoprolol tartrate (LOPRESSOR) 25 MG tablet; Take 1 tablet (25 mg total) by mouth 2 (two) times daily.  Dispense: 60 tablet; Refill: 1 - benazepril (LOTENSIN) 40 MG tablet; Take 1 tablet (40 mg total) by mouth daily. Take 1 tablet by mouth daily.  Dispense: 30 tablet; Refill: 2 - POCT glycosylated hemoglobin (Hb A1C)  Follow up:  Follow up in 3 months or sooner if needed     Ivonne Andrew, NP 04/27/2022

## 2022-05-07 ENCOUNTER — Other Ambulatory Visit: Payer: Self-pay | Admitting: Pharmacist

## 2022-05-09 ENCOUNTER — Other Ambulatory Visit: Payer: Self-pay

## 2022-05-09 ENCOUNTER — Other Ambulatory Visit: Payer: Self-pay | Admitting: Pharmacist

## 2022-05-09 ENCOUNTER — Other Ambulatory Visit: Payer: Self-pay | Admitting: Physician Assistant

## 2022-05-09 DIAGNOSIS — I10 Essential (primary) hypertension: Secondary | ICD-10-CM

## 2022-05-09 MED ORDER — AMLODIPINE BESYLATE 5 MG PO TABS
5.0000 mg | ORAL_TABLET | Freq: Every day | ORAL | 1 refills | Status: DC
  Filled 2022-05-09 – 2022-05-23 (×3): qty 30, 30d supply, fill #0
  Filled 2022-07-11 (×2): qty 30, 30d supply, fill #1

## 2022-05-09 MED ORDER — ISOSORBIDE MONONITRATE ER 30 MG PO TB24
30.0000 mg | ORAL_TABLET | Freq: Every day | ORAL | 1 refills | Status: DC
  Filled 2022-05-09 – 2022-05-23 (×2): qty 30, 30d supply, fill #0
  Filled 2022-07-11 (×2): qty 30, 30d supply, fill #1

## 2022-05-09 MED ORDER — METFORMIN HCL 500 MG PO TABS
1000.0000 mg | ORAL_TABLET | Freq: Two times a day (BID) | ORAL | 1 refills | Status: DC
  Filled 2022-05-09: qty 360, 90d supply, fill #0

## 2022-05-09 MED ORDER — ISOSORBIDE DINITRATE 10 MG PO TABS
10.0000 mg | ORAL_TABLET | Freq: Three times a day (TID) | ORAL | 2 refills | Status: DC
  Filled 2022-05-09: qty 90, 30d supply, fill #0

## 2022-05-09 NOTE — Progress Notes (Signed)
05/09/2022 Name: Todd Gonzalez MRN: [id number] DOB: 14-Apr-1956  Chief Complaint  Patient presents with   Medication Management   Diabetes   Hypertension   Hyperlipidemia    Todd Gonzalez is a 66 y.o. year old male who was referred for medication management by their primary care provider, Todd Andrew, NP. They presented for a face to face visit today.   They were referred to the pharmacist by their PCP for assistance in managing diabetes    Subjective:  Care Team: Primary Care Provider: Ivonne Andrew, NP ; Next Scheduled Visit: 07/27/22 Cardiology: [facility]; due in March  Medication Access/Adherence  Current Pharmacy:  [facility] [address] [place]), [place] - [address] [place] ([place]) [place] [address] Phone: [phone] Fax: [phone]  [facility] [address] [place] [address] Phone: [phone] Fax: [phone]  [facility] [address], [address] [place] [address] Phone: [phone] Fax: [phone]   Patient reports affordability concerns with their medications: Yes  Patient reports access/transportation concerns to their pharmacy: No  Patient reports adherence concerns with their medications:  Yes    Reports he lost his job at [employer] and lost insurance. Has been out of amlodipine for ~ 3 days. Reports isosorbide is expensive.    Diabetes:  Current medications: metformin 500 mg twice daily  Current glucose readings: reports fastings and pre-prandials are 131-150s  Patient denies hypoglycemic s/sx including dizziness, shakiness, sweating. Patient denies hyperglycemic symptoms including polyuria, polydipsia, polyphagia, nocturia, neuropathy, blurred vision.  Denies any GI upset, nausea, diarrhea with metformin.   Hypertension:  Current medications: amlodipine 5 mg daily, benazepril 40 mg daily,  metoprolol tartrate 25 mg twice daily  Antianginal - isosorbide 10 mg three times daily - reports this is expensive   Patient denies hypotensive s/sx including dizziness, lightheadedness.  Patient denies hypertensive symptoms including headache, chest pain, shortness of breath   Hyperlipidemia/ASCVD Risk Reduction  Current lipid lowering medications: atorvastatin 40 mg daily  Antiplatelet regimen: aspirin 81 mg daily  Health Maintenance  Health Maintenance Due  Topic Date Due   COVID-19 Vaccine (1) Never done   Pneumonia Vaccine 72+ Years old (1 - PCV) Never done   FOOT EXAM  Never done   OPHTHALMOLOGY EXAM  Never done   Diabetic kidney evaluation - Urine ACR  Never done   Hepatitis C Screening  Never done   DTaP/Tdap/Td (1 - Tdap) Never done   Zoster Vaccines- Shingrix (1 of 2) Never done   COLONOSCOPY (Pts 45-3yrs Insurance coverage will need to be confirmed)  Never done     Objective: Lab Results  Component Value Date   HGBA1C 9.0 (A) 04/27/2022    Lab Results  Component Value Date   CREATININE 1.17 01/26/2022   BUN 24 01/26/2022   NA 143 01/26/2022   K 4.8 01/26/2022   CL 106 01/26/2022   CO2 19 (L) 01/26/2022    Lab Results  Component Value Date   CHOL 145 04/24/2021   HDL 70 04/24/2021   LDLCALC 60 04/24/2021   TRIG 75 04/24/2021   CHOLHDL 2.1 04/24/2021    Medications Reviewed Today     Reviewed by Alden Hipp, RPH-CPP (Pharmacist) on 05/09/22 at [phone]  Med List Status: <None>   Medication Order Taking? Sig Documenting Provider Last Dose Status Informant  amLODipine (NORVASC) 5 MG tablet [id number] No Take 1 tablet (5 mg  total) by mouth daily.  Patient not taking: Reported on 05/09/2022   Todd Andrew, NP Not Taking Active   aspirin EC 81 MG tablet [id number]  Take 1 tablet (81 mg total) by mouth daily. Swallow whole. Furth, Cadence H, PA-C  Active Self  atorvastatin (LIPITOR) 20 MG tablet [id number] Yes Take 2 tablets (40 mg total) by  mouth daily. Todd Andrew, NP Taking Active   benazepril (LOTENSIN) 40 MG tablet [id number] Yes Take 1 tablet (40 mg total) by mouth daily. Take 1 tablet by mouth daily. Todd Andrew, NP Taking Active   isosorbide dinitrate (ISORDIL) 10 MG tablet [id number] Yes Take 1 tablet (10 mg total) by mouth 3 (three) times daily. Janetta Hora, PA-C Taking Active   metFORMIN (GLUCOPHAGE) 500 MG tablet [id number] Yes Take 1 tablet (500 mg total) by mouth 2 (two) times daily with a meal. Todd Andrew, NP Taking Active   metoprolol tartrate (LOPRESSOR) 25 MG tablet [id number] Yes Take 1 tablet (25 mg total) by mouth 2 (two) times daily. Todd Andrew, NP Taking Active   Multiple Vitamins-Minerals (MULTIVITAMIN WITH MINERALS) tablet [id number] Yes Take 1 tablet by mouth daily. [provider] Taking Active Self  naproxen sodium (ALEVE) 220 MG tablet [id number]  Take 220-440 mg by mouth 2 (two) times daily as needed (pain). [provider]  Active Self  nitroGLYCERIN (NITROSTAT) 0.4 MG SL tablet [id number] No Place 1 tablet (0.4 mg total) under the tongue every 5 (five) minutes as needed for chest pain.  Patient not taking: Reported on 01/30/2022   Rodolph Bong, MD Not Taking Active   ondansetron [facility]) 4 MG tablet [id number] No Take 1 tablet (4 mg total) by mouth every 6 (six) hours as needed for nausea.  Patient not taking: Reported on 01/30/2022   Rodolph Bong, MD Not Taking Active             SDOH Interventions Today    Flowsheet Row Most Recent Value  SDOH Interventions   Financial Strain Interventions Other (Comment)  [refer to free pharmacy]       Assessment/Plan:   Diabetes: - Currently uncontrolled - Reviewed long term cardiovascular and renal outcomes of uncontrolled blood sugar - Reviewed goal A1c, goal fasting, and goal 2 hour post prandial glucose - Recommend to increase metformin to 1000 mg twice daily. Counseled on risk of GI upset.  Discussed with PCP, she is in agreement. Script sent to pharmacy - Recommend to check glucose daily  Hypertension: - Currently controlled - Recommend to continue current regimen. Facilitated refills at [facility] pharmacy.   - Discussed adherence and cost concerns with isosorbide dinitrate with Cline Crock, PA. She was in agreement to change from isosorbide dinitrate to mononitrate. Script sent with co-sign. Patient notified.   Hyperlipidemia/ASCVD Risk Reduction: - Currently controlled.  - Recommend to continue current regimen at this time  Follow Up Plan: phone call in ~ 3 weeks  Catie Eppie Gibson, PharmD, [facility] [phone]

## 2022-05-11 ENCOUNTER — Other Ambulatory Visit: Payer: Self-pay

## 2022-05-15 ENCOUNTER — Other Ambulatory Visit: Payer: Self-pay

## 2022-05-23 ENCOUNTER — Other Ambulatory Visit: Payer: Self-pay

## 2022-05-30 ENCOUNTER — Other Ambulatory Visit: Payer: Self-pay | Admitting: Physician Assistant

## 2022-05-30 DIAGNOSIS — I10 Essential (primary) hypertension: Secondary | ICD-10-CM

## 2022-05-30 MED ORDER — BENAZEPRIL HCL 40 MG PO TABS: 40.0000 mg | ORAL_TABLET | Freq: Every day | ORAL | 1 refills | Status: DC

## 2022-05-30 NOTE — Addendum Note (Signed)
Addended by: Ronie Spies on: 05/30/2022 01:22 PM   Modules accepted: Orders

## 2022-07-11 ENCOUNTER — Other Ambulatory Visit: Payer: Self-pay | Admitting: Nurse Practitioner

## 2022-07-11 ENCOUNTER — Other Ambulatory Visit: Payer: Self-pay

## 2022-07-11 DIAGNOSIS — I10 Essential (primary) hypertension: Secondary | ICD-10-CM

## 2022-07-12 ENCOUNTER — Other Ambulatory Visit: Payer: Self-pay

## 2022-07-12 MED ORDER — METOPROLOL TARTRATE 25 MG PO TABS
25.0000 mg | ORAL_TABLET | Freq: Two times a day (BID) | ORAL | 1 refills | Status: DC
  Filled 2022-07-12 – 2022-07-13 (×2): qty 60, 30d supply, fill #0

## 2022-07-12 MED ORDER — ATORVASTATIN CALCIUM 20 MG PO TABS
40.0000 mg | ORAL_TABLET | Freq: Every day | ORAL | 1 refills | Status: DC
  Filled 2022-07-12: qty 60, 30d supply, fill #0

## 2022-07-13 ENCOUNTER — Other Ambulatory Visit: Payer: Self-pay

## 2022-07-19 ENCOUNTER — Other Ambulatory Visit: Payer: Self-pay

## 2022-07-27 ENCOUNTER — Ambulatory Visit (INDEPENDENT_AMBULATORY_CARE_PROVIDER_SITE_OTHER): Payer: Self-pay | Admitting: Nurse Practitioner

## 2022-07-27 ENCOUNTER — Encounter: Payer: Self-pay | Admitting: Nurse Practitioner

## 2022-07-27 VITALS — BP 123/57 | HR 95 | Temp 97.9°F | Ht 66.0 in | Wt 161.2 lb

## 2022-07-27 DIAGNOSIS — E1100 Type 2 diabetes mellitus with hyperosmolarity without nonketotic hyperglycemic-hyperosmolar coma (NKHHC): Secondary | ICD-10-CM

## 2022-07-27 DIAGNOSIS — I10 Essential (primary) hypertension: Secondary | ICD-10-CM

## 2022-07-27 DIAGNOSIS — Z13220 Encounter for screening for lipoid disorders: Secondary | ICD-10-CM

## 2022-07-27 DIAGNOSIS — Z1211 Encounter for screening for malignant neoplasm of colon: Secondary | ICD-10-CM

## 2022-07-27 LAB — POCT GLYCOSYLATED HEMOGLOBIN (HGB A1C): Hemoglobin A1C: 7.9 % — AB (ref 4.0–5.6)

## 2022-07-27 MED ORDER — AMLODIPINE BESYLATE 5 MG PO TABS: 5.0000 mg | ORAL_TABLET | Freq: Every day | ORAL | 1 refills | Status: DC

## 2022-07-27 MED ORDER — METOPROLOL TARTRATE 25 MG PO TABS: 25.0000 mg | ORAL_TABLET | Freq: Two times a day (BID) | ORAL | 2 refills | Status: DC

## 2022-07-27 MED ORDER — ISOSORBIDE MONONITRATE ER 30 MG PO TB24: 30.0000 mg | ORAL_TABLET | Freq: Every day | ORAL | 1 refills | Status: DC

## 2022-07-27 NOTE — Patient Instructions (Addendum)
1. Type 2 diabetes mellitus with hyperosmolar hyperglycemic state (HHS) (HCC)  - Microalbumin/Creatinine Ratio, Urine - POCT glycosylated hemoglobin (Hb A1C) - CBC - Comprehensive metabolic panel - Lipid Panel  2. Colon cancer screening  - Cologuard  3. Essential hypertension  - amLODipine (NORVASC) 5 MG tablet; Take 1 tablet (5 mg total) by mouth daily.  Dispense: 90 tablet; Refill: 1 - metoprolol tartrate (LOPRESSOR) 25 MG tablet; Take 1 tablet (25 mg total) by mouth 2 (two) times daily.  Dispense: 60 tablet; Refill: 2  4. Lipid screening  - Lipid Panel  Follow up:  Follow up in 3 months

## 2022-07-27 NOTE — Progress Notes (Signed)
Subjective:    Patient ID: UPTON KOSTECKI, male    DOB: 1956/02/20, 67 y.o.   MRN: [id number]  PRANISH PINKS is a 67 y.o. male who presents for follow-up of Type 2 diabetes mellitus.  Patient is checking home blood sugars.   Home blood sugar records: BGs have been labile ranging between 139 and 159 How often is blood sugars being checked: every other day Current symptoms/problems include none and have been stable.  The following portions of the patient's history were reviewed and updated as appropriate: allergies, current medications, past medical history, past social history and problem list.  Review of Systems  Constitutional: Negative.   HENT: Negative.    Eyes: Negative.   Respiratory: Negative.    Cardiovascular: Negative.   Gastrointestinal: Negative.   Genitourinary: Negative.   Musculoskeletal: Negative.   Skin: Negative.   Neurological: Negative.   Endo/Heme/Allergies: Negative.   Psychiatric/Behavioral: Negative.          Objective:    Physical Exam Constitutional:      General: He is not in acute distress. Cardiovascular:     Rate and Rhythm: Normal rate and regular rhythm.  Pulmonary:     Effort: Pulmonary effort is normal.     Breath sounds: Normal breath sounds.  Skin:    General: Skin is warm and dry.  Neurological:     Mental Status: He is alert and oriented to person, place, and time.  Psychiatric:        Mood and Affect: Affect normal.      Blood pressure (!) 123/57, pulse 95, temperature 97.9 F (36.6 C), height '5\' 6"'$  (1.676 m), weight 161 lb 3.2 oz (73.1 kg), SpO2 100 %.  Lab Review    Latest Ref Rng & Units 07/27/2022   10:11 AM 04/27/2022   11:01 AM 01/26/2022   10:34 AM 01/26/2022   10:16 AM 11/09/2021   11:33 AM  Diabetic Labs  HbA1c 4.0 - 5.6 % 7.9  9.0   9.3    9.3    9.3    9.3    Creatinine 0.76 - 1.27 mg/dL   1.17   1.01       07/27/2022   10:03 AM 04/27/2022   10:06 AM 02/12/2022   10:09 AM 01/26/2022   10:02 AM 11/09/2021    10:54 AM  BP/Weight  Systolic BP [id number]  Diastolic BP 57 63 64 65 68  Wt. (Lbs) 161.2 158  158 153.4  BMI 26.02 kg/m2 25.89 kg/m2  25.89 kg/m2 25.14 kg/m2       No data to display          [place]  reports that he has been smoking cigarettes. He has been smoking an average of 1 pack per day. He has never used smokeless tobacco. He reports current alcohol use. He reports that he does not use drugs.     Assessment & Plan:    Type 2 diabetes mellitus with hyperosmolar hyperglycemic state (HHS) ([place]) - Plan: Microalbumin/Creatinine Ratio, Urine, POCT glycosylated hemoglobin (Hb A1C), CBC, Comprehensive metabolic panel, Lipid Panel  Colon cancer screening - Plan: Cologuard  Essential hypertension - Plan: amLODipine (NORVASC) 5 MG tablet, metoprolol tartrate (LOPRESSOR) 25 MG tablet  Lipid screening - Plan: Lipid Panel  Rx changes: none Education: Reviewed 'ABCs' of diabetes management (respective goals in parentheses):  A1C (<7), blood pressure (<130/80), and cholesterol (LDL <100). Compliance at present is estimated to be good. Efforts to improve compliance (if  necessary) will be directed at increased exercise. Follow up: 3 months  Lazaro Arms, FNP-C 07/27/22

## 2022-07-27 NOTE — Progress Notes (Deleted)
$'@Patient'F$  ID: Todd Gonzalez, male    DOB: 09/29/1955, 67 y.o.   MRN: [id number]  Chief Complaint  Patient presents with   Hypertension   Diabetes    Follow up    Referring provider: Fenton Foy, NP   HPI  Todd Gonzalez is a 67 y.o. male with medical history significant of hypertension, hyperlipidemia, CAD s/p CABG, and diabetes.     Patient presents today for a diabetes follow up. He states that blood sugars at home have been around 126. A1C today was 9.0 down from 9.3 at last check. Will continue medication at current dosage.  Unfortunately patient states that he has lost his job and insurance.  Will look into getting assistance for his medications.  Patient was given groceries today from the food pantry.  Denies f/c/s, n/v/d, hemoptysis, PND, leg swelling Denies chest pain or edema     No Known Allergies   There is no immunization history on file for this patient.  Past Medical History:  Diagnosis Date   CAD (coronary artery disease)    ruptured plaque in OM 2003, NSTEMI/CABG 01/2020   Headache(784.0)    HLD (hyperlipidemia)    HTN (hypertension)    Mild carotid artery disease (HCC)    Pre-diabetes    RBBB (right bundle branch block)     Tobacco History: Social History   Tobacco Use  Smoking Status Some Days   Packs/day: 1.00   Types: Cigarettes  Smokeless Tobacco Never  Tobacco Comments   1 pack every 4 days   Ready to quit: Not Answered Counseling given: Not Answered Tobacco comments: 1 pack every 4 days   Outpatient Encounter Medications as of 07/27/2022  Medication Sig   amLODipine (NORVASC) 5 MG tablet Take 1 tablet (5 mg total) by mouth daily.   aspirin EC 81 MG tablet Take 1 tablet (81 mg total) by mouth daily. Swallow whole.   atorvastatin (LIPITOR) 20 MG tablet Take 2 tablets (40 mg total) by mouth daily.   benazepril (LOTENSIN) 40 MG tablet Take 1 tablet (40 mg total) by mouth daily.   isosorbide mononitrate (IMDUR) 30 MG 24 hr  tablet Take 1 tablet (30 mg total) by mouth daily.   metFORMIN (GLUCOPHAGE) 500 MG tablet Take 2 tablets (1,000 mg total) by mouth 2 (two) times daily with a meal.   metoprolol tartrate (LOPRESSOR) 25 MG tablet Take 1 tablet (25 mg total) by mouth 2 (two) times daily.   Multiple Vitamins-Minerals (MULTIVITAMIN WITH MINERALS) tablet Take 1 tablet by mouth daily.   naproxen sodium (ALEVE) 220 MG tablet Take 220-440 mg by mouth 2 (two) times daily as needed (pain).   nitroGLYCERIN (NITROSTAT) 0.4 MG SL tablet Place 1 tablet (0.4 mg total) under the tongue every 5 (five) minutes as needed for chest pain. (Patient not taking: Reported on 01/30/2022)   ondansetron (ZOFRAN) 4 MG tablet Take 1 tablet (4 mg total) by mouth every 6 (six) hours as needed for nausea. (Patient not taking: Reported on 01/30/2022)   No facility-administered encounter medications on file as of 07/27/2022.     Review of Systems  Review of Systems  Constitutional: Negative.   HENT: Negative.    Cardiovascular: Negative.   Gastrointestinal: Negative.   Allergic/Immunologic: Negative.   Neurological: Negative.   Psychiatric/Behavioral: Negative.         Physical Exam  There were no vitals taken for this visit.  Wt Readings from Last 5 Encounters:  04/27/22 158 lb (71.7 kg)  01/26/22 158 lb (71.7 kg)  11/09/21 153 lb 6.4 oz (69.6 kg)  10/26/21 150 lb 9.6 oz (68.3 kg)  10/10/21 178 lb (80.7 kg)     Physical Exam Vitals and nursing note reviewed.  Constitutional:      General: He is not in acute distress.    Appearance: He is well-developed.  Cardiovascular:     Rate and Rhythm: Normal rate and regular rhythm.  Pulmonary:     Effort: Pulmonary effort is normal.     Breath sounds: Normal breath sounds.  Skin:    General: Skin is warm and dry.  Neurological:     Mental Status: He is alert and oriented to person, place, and time.      Lab Results:  CBC    Component Value Date/Time   WBC 8.2 01/26/2022  1034   WBC 6.9 10/11/2021 0319   RBC 3.83 (L) 01/26/2022 1034   RBC 4.39 10/11/2021 0319   HGB 11.9 (L) 01/26/2022 1034   HCT 34.8 (L) 01/26/2022 1034   PLT 251 01/26/2022 1034   MCV 91 01/26/2022 1034   MCH 31.1 01/26/2022 1034   MCH 29.6 10/11/2021 0319   MCHC 34.2 01/26/2022 1034   MCHC 34.9 10/11/2021 0319   RDW 13.1 01/26/2022 1034   LYMPHSABS 2.2 10/10/2021 0925   MONOABS 0.6 10/10/2021 0925   EOSABS 0.1 10/10/2021 0925   BASOSABS 0.1 10/10/2021 0925    BMET    Component Value Date/Time   NA 143 01/26/2022 1034   K 4.8 01/26/2022 1034   CL 106 01/26/2022 1034   CO2 19 (L) 01/26/2022 1034   GLUCOSE 87 01/26/2022 1034   GLUCOSE 113 (H) 10/11/2021 0319   BUN 24 01/26/2022 1034   CREATININE 1.17 01/26/2022 1034   CALCIUM 9.1 01/26/2022 1034   GFRNONAA >60 10/11/2021 0319   GFRAA 99 05/09/2020 1059      Assessment & Plan:   No problem-specific Assessment & Plan notes found for this encounter.     Fenton Foy, NP 07/27/2022

## 2022-07-28 LAB — COMPREHENSIVE METABOLIC PANEL
ALT: 28 IU/L (ref 0–44)
AST: 17 IU/L (ref 0–40)
Albumin/Globulin Ratio: 1.5 (ref 1.2–2.2)
Albumin: 4 g/dL (ref 3.9–4.9)
Alkaline Phosphatase: 64 IU/L (ref 44–121)
BUN/Creatinine Ratio: 18 (ref 10–24)
BUN: 19 mg/dL (ref 8–27)
Bilirubin Total: <0.2 mg/dL (ref 0.0–1.2)
CO2: 22 mmol/L (ref 20–29)
Calcium: 8.9 mg/dL (ref 8.6–10.2)
Chloride: 109 mmol/L — ABNORMAL HIGH (ref 96–106)
Creatinine, Ser: 1.06 mg/dL (ref 0.76–1.27)
Globulin, Total: 2.6 g/dL (ref 1.5–4.5)
Glucose: 99 mg/dL (ref 70–99)
Potassium: 4.3 mmol/L (ref 3.5–5.2)
Sodium: 144 mmol/L (ref 134–144)
Total Protein: 6.6 g/dL (ref 6.0–8.5)
eGFR: 76 mL/min/{1.73_m2} (ref >59)

## 2022-07-28 LAB — LIPID PANEL
Chol/HDL Ratio: 2.2 ratio (ref 0.0–5.0)
Cholesterol, Total: 163 mg/dL (ref 100–199)
HDL: 75 mg/dL (ref >39)
LDL Chol Calc (NIH): 76 mg/dL (ref 0–99)
Triglycerides: 59 mg/dL (ref 0–149)
VLDL Cholesterol Cal: 12 mg/dL (ref 5–40)

## 2022-07-28 LAB — CBC
Hematocrit: 36.8 % — ABNORMAL LOW (ref 37.5–51.0)
Hemoglobin: 12 g/dL — ABNORMAL LOW (ref 13.0–17.7)
MCH: 28.8 pg (ref 26.6–33.0)
MCHC: 32.6 g/dL (ref 31.5–35.7)
MCV: 89 fL (ref 79–97)
Platelets: 304 10*3/uL (ref 150–450)
RBC: 4.16 x10E6/uL (ref 4.14–5.80)
RDW: 14.1 % (ref 11.6–15.4)
WBC: 10.1 10*3/uL (ref 3.4–10.8)

## 2022-07-30 LAB — MICROALBUMIN / CREATININE URINE RATIO
Creatinine, Urine: 130 mg/dL
Microalb/Creat Ratio: 8 mg/g creat (ref 0–29)
Microalbumin, Urine: 10.4 ug/mL

## 2022-07-30 NOTE — Progress Notes (Signed)
Called pt and  left a message for a call back. Todd Gonzalez

## 2022-10-19 ENCOUNTER — Other Ambulatory Visit: Payer: Self-pay | Admitting: Physician Assistant

## 2022-10-19 ENCOUNTER — Other Ambulatory Visit: Payer: Self-pay | Admitting: Cardiovascular Disease

## 2022-10-26 ENCOUNTER — Ambulatory Visit: Payer: Self-pay | Admitting: Nurse Practitioner

## 2022-11-02 ENCOUNTER — Encounter: Payer: Self-pay | Admitting: Nurse Practitioner

## 2022-11-02 ENCOUNTER — Ambulatory Visit (INDEPENDENT_AMBULATORY_CARE_PROVIDER_SITE_OTHER): Payer: Self-pay | Admitting: Nurse Practitioner

## 2022-11-02 VITALS — BP 122/63 | HR 70 | Temp 97.0°F | Wt 153.2 lb

## 2022-11-02 DIAGNOSIS — I10 Essential (primary) hypertension: Secondary | ICD-10-CM

## 2022-11-02 DIAGNOSIS — E1100 Type 2 diabetes mellitus with hyperosmolarity without nonketotic hyperglycemic-hyperosmolar coma (NKHHC): Secondary | ICD-10-CM

## 2022-11-02 LAB — POCT GLYCOSYLATED HEMOGLOBIN (HGB A1C): Hemoglobin A1C: 6.8 % — AB (ref 4.0–5.6)

## 2022-11-02 MED ORDER — AMLODIPINE BESYLATE 5 MG PO TABS
5.0000 mg | ORAL_TABLET | Freq: Every day | ORAL | 1 refills | Status: DC
Start: 2022-11-02 — End: 2023-02-01

## 2022-11-02 MED ORDER — METOPROLOL TARTRATE 25 MG PO TABS
25.0000 mg | ORAL_TABLET | Freq: Two times a day (BID) | ORAL | 2 refills | Status: DC
Start: 2022-11-02 — End: 2023-04-24

## 2022-11-02 MED ORDER — METFORMIN HCL 500 MG PO TABS
1000.0000 mg | ORAL_TABLET | Freq: Two times a day (BID) | ORAL | 1 refills | Status: DC
Start: 2022-11-02 — End: 2023-09-06

## 2022-11-02 MED ORDER — BENAZEPRIL HCL 40 MG PO TABS: 40.0000 mg | ORAL_TABLET | Freq: Every day | ORAL | 1 refills | Status: DC

## 2022-11-02 MED ORDER — ISOSORBIDE MONONITRATE ER 30 MG PO TB24: 30.0000 mg | ORAL_TABLET | Freq: Every day | ORAL | 1 refills | Status: DC

## 2022-11-02 MED ORDER — ATORVASTATIN CALCIUM 20 MG PO TABS: 40.0000 mg | ORAL_TABLET | Freq: Every day | ORAL | 1 refills | Status: DC

## 2022-11-02 NOTE — Progress Notes (Signed)
@Patient  ID: Todd Gonzalez, male    DOB: [birth year], 67 y.o.   MRN: [id number]  Chief Complaint  Patient presents with   Diabetes    Follow up    Referring provider: Ivonne Andrew, NP   HPI  Todd Gonzalez is a 67 y.o. male with medical history significant of hypertension, hyperlipidemia, CAD s/p CABG, and diabetes.   Diabetes:   Patient presents today for a diabetes follow up. He states that blood sugars at home have been around 126. A1C today was 6.8. Will continue medication at current dosage.  Unfortunately patient states that he has lost his job and insurance.  Will look into getting assistance for his medications.  Patient was given groceries today from the food pantry.  Denies f/c/s, n/v/d, hemoptysis, PND, leg swelling Denies chest pain or edema   Hypertension:  Patient presents for follow-up on hypertension.  Patient is currently on isosorbide, benazepril, amlodipine.  Patient is also on atorvastatin for cholesterol but does not have this in his bag with his other medications today.  We will send in a refill for this today along with his other medications.  Blood pressure stable in office today. Denies f/c/s, n/v/d, hemoptysis, PND, leg swelling Denies chest pain or edema    No Known Allergies   There is no immunization history on file for this patient.  Past Medical History:  Diagnosis Date   CAD (coronary artery disease)    ruptured plaque in OM 2003, NSTEMI/CABG 01/2020   Headache(784.0)    HLD (hyperlipidemia)    HTN (hypertension)    Mild carotid artery disease (HCC)    Pre-diabetes    RBBB (right bundle branch block)     Tobacco History: Social History   Tobacco Use  Smoking Status Some Days   Packs/day: 1   Types: Cigarettes  Smokeless Tobacco Never  Tobacco Comments   1 pack every 4 days   Ready to quit: Not Answered Counseling given: Not Answered Tobacco comments: 1 pack every 4 days   Outpatient Encounter Medications as of  11/02/2022  Medication Sig   aspirin EC 81 MG tablet Take 1 tablet (81 mg total) by mouth daily. Swallow whole.   Multiple Vitamins-Minerals (MULTIVITAMIN WITH MINERALS) tablet Take 1 tablet by mouth daily.   naproxen sodium (ALEVE) 220 MG tablet Take 220-440 mg by mouth 2 (two) times daily as needed (pain).   [DISCONTINUED] amLODipine (NORVASC) 5 MG tablet Take 1 tablet (5 mg total) by mouth daily.   [DISCONTINUED] atorvastatin (LIPITOR) 20 MG tablet Take 2 tablets (40 mg total) by mouth daily.   [DISCONTINUED] benazepril (LOTENSIN) 40 MG tablet Take 1 tablet (40 mg total) by mouth daily.   [DISCONTINUED] isosorbide mononitrate (IMDUR) 30 MG 24 hr tablet Take 1 tablet (30 mg total) by mouth daily.   [DISCONTINUED] metFORMIN (GLUCOPHAGE) 500 MG tablet Take 2 tablets (1,000 mg total) by mouth 2 (two) times daily with a meal.   amLODipine (NORVASC) 5 MG tablet Take 1 tablet (5 mg total) by mouth daily.   atorvastatin (LIPITOR) 20 MG tablet Take 2 tablets (40 mg total) by mouth daily.   benazepril (LOTENSIN) 40 MG tablet Take 1 tablet (40 mg total) by mouth daily.   isosorbide mononitrate (IMDUR) 30 MG 24 hr tablet Take 1 tablet (30 mg total) by mouth daily.   metFORMIN (GLUCOPHAGE) 500 MG tablet Take 2 tablets (1,000 mg total) by mouth 2 (two) times daily with a meal.   metoprolol tartrate (LOPRESSOR)  25 MG tablet Take 1 tablet (25 mg total) by mouth 2 (two) times daily.   nitroGLYCERIN (NITROSTAT) 0.4 MG SL tablet Place 1 tablet (0.4 mg total) under the tongue every 5 (five) minutes as needed for chest pain. (Patient not taking: Reported on 01/30/2022)   ondansetron (ZOFRAN) 4 MG tablet Take 1 tablet (4 mg total) by mouth every 6 (six) hours as needed for nausea. (Patient not taking: Reported on 01/30/2022)   [DISCONTINUED] metoprolol tartrate (LOPRESSOR) 25 MG tablet Take 1 tablet (25 mg total) by mouth 2 (two) times daily.   No facility-administered encounter medications on file as of 11/02/2022.      Review of Systems  Review of Systems  Constitutional: Negative.   HENT: Negative.    Cardiovascular: Negative.   Gastrointestinal: Negative.   Allergic/Immunologic: Negative.   Neurological: Negative.   Psychiatric/Behavioral: Negative.         Physical Exam  BP 122/63   Pulse 70   Temp (!) 97 F (36.1 C)   Wt 153 lb 3.2 oz (69.5 kg)   SpO2 100%   BMI 24.73 kg/m   Wt Readings from Last 5 Encounters:  11/02/22 153 lb 3.2 oz (69.5 kg)  07/27/22 161 lb 3.2 oz (73.1 kg)  04/27/22 158 lb (71.7 kg)  01/26/22 158 lb (71.7 kg)  11/09/21 153 lb 6.4 oz (69.6 kg)     Physical Exam Vitals and nursing note reviewed.  Constitutional:      General: He is not in acute distress.    Appearance: He is well-developed.  Cardiovascular:     Rate and Rhythm: Normal rate and regular rhythm.  Pulmonary:     Effort: Pulmonary effort is normal.     Breath sounds: Normal breath sounds.  Skin:    General: Skin is warm and dry.  Neurological:     Mental Status: He is alert and oriented to person, place, and time.      Lab Results:  CBC    Component Value Date/Time   WBC 10.1 07/27/2022 1025   WBC 6.9 10/11/2021 0319   RBC 4.16 07/27/2022 1025   RBC 4.39 10/11/2021 0319   HGB 12.0 (L) 07/27/2022 1025   HCT 36.8 (L) 07/27/2022 1025   PLT 304 07/27/2022 1025   MCV 89 07/27/2022 1025   MCH 28.8 07/27/2022 1025   MCH 29.6 10/11/2021 0319   MCHC 32.6 07/27/2022 1025   MCHC 34.9 10/11/2021 0319   RDW 14.1 07/27/2022 1025   LYMPHSABS 2.2 10/10/2021 0925   MONOABS 0.6 10/10/2021 0925   EOSABS 0.1 10/10/2021 0925   BASOSABS 0.1 10/10/2021 0925    BMET    Component Value Date/Time   NA 144 07/27/2022 1025   K 4.3 07/27/2022 1025   CL 109 (H) 07/27/2022 1025   CO2 22 07/27/2022 1025   GLUCOSE 99 07/27/2022 1025   GLUCOSE 113 (H) 10/11/2021 0319   BUN 19 07/27/2022 1025   CREATININE 1.06 07/27/2022 1025   CALCIUM 8.9 07/27/2022 1025   GFRNONAA >60 10/11/2021 0319    GFRAA 99 05/09/2020 1059      Assessment & Plan:   Essential hypertension - atorvastatin (LIPITOR) 20 MG tablet; Take 2 tablets (40 mg total) by mouth daily.  Dispense: 60 tablet; Refill: 1 - metoprolol tartrate (LOPRESSOR) 25 MG tablet; Take 1 tablet (25 mg total) by mouth 2 (two) times daily.  Dispense: 60 tablet; Refill: 2 - isosorbide mononitrate (IMDUR) 30 MG 24 hr tablet; Take 1 tablet (30 mg  total) by mouth daily.  Dispense: 90 tablet; Refill: 1 - benazepril (LOTENSIN) 40 MG tablet; Take 1 tablet (40 mg total) by mouth daily.  Dispense: 90 tablet; Refill: 1 - amLODipine (NORVASC) 5 MG tablet; Take 1 tablet (5 mg total) by mouth daily.  Dispense: 90 tablet; Refill: 1   2. Type 2 diabetes mellitus with hyperosmolar hyperglycemic state (HHS) (HCC)  - POCT glycosylated hemoglobin (Hb A1C) - atorvastatin (LIPITOR) 20 MG tablet; Take 2 tablets (40 mg total) by mouth daily.  Dispense: 60 tablet; Refill: 1 - metFORMIN (GLUCOPHAGE) 500 MG tablet; Take 2 tablets (1,000 mg total) by mouth 2 (two) times daily with a meal.  Dispense: 360 tablet; Refill: 1   Follow up:  Follow up in 3 months     Ivonne Andrew, NP 11/02/2022

## 2022-11-02 NOTE — Patient Instructions (Addendum)
1. Essential hypertension  - atorvastatin (LIPITOR) 20 MG tablet; Take 2 tablets (40 mg total) by mouth daily.  Dispense: 60 tablet; Refill: 1 - metoprolol tartrate (LOPRESSOR) 25 MG tablet; Take 1 tablet (25 mg total) by mouth 2 (two) times daily.  Dispense: 60 tablet; Refill: 2 - isosorbide mononitrate (IMDUR) 30 MG 24 hr tablet; Take 1 tablet (30 mg total) by mouth daily.  Dispense: 90 tablet; Refill: 1 - benazepril (LOTENSIN) 40 MG tablet; Take 1 tablet (40 mg total) by mouth daily.  Dispense: 90 tablet; Refill: 1 - amLODipine (NORVASC) 5 MG tablet; Take 1 tablet (5 mg total) by mouth daily.  Dispense: 90 tablet; Refill: 1   2. Type 2 diabetes mellitus with hyperosmolar hyperglycemic state (HHS) (HCC)  - POCT glycosylated hemoglobin (Hb A1C) - atorvastatin (LIPITOR) 20 MG tablet; Take 2 tablets (40 mg total) by mouth daily.  Dispense: 60 tablet; Refill: 1 - metFORMIN (GLUCOPHAGE) 500 MG tablet; Take 2 tablets (1,000 mg total) by mouth 2 (two) times daily with a meal.  Dispense: 360 tablet; Refill: 1   Follow up:  Follow up in 3 months

## 2022-11-02 NOTE — Assessment & Plan Note (Signed)
-   atorvastatin (LIPITOR) 20 MG tablet; Take 2 tablets (40 mg total) by mouth daily.  Dispense: 60 tablet; Refill: 1 - metoprolol tartrate (LOPRESSOR) 25 MG tablet; Take 1 tablet (25 mg total) by mouth 2 (two) times daily.  Dispense: 60 tablet; Refill: 2 - isosorbide mononitrate (IMDUR) 30 MG 24 hr tablet; Take 1 tablet (30 mg total) by mouth daily.  Dispense: 90 tablet; Refill: 1 - benazepril (LOTENSIN) 40 MG tablet; Take 1 tablet (40 mg total) by mouth daily.  Dispense: 90 tablet; Refill: 1 - amLODipine (NORVASC) 5 MG tablet; Take 1 tablet (5 mg total) by mouth daily.  Dispense: 90 tablet; Refill: 1   2. Type 2 diabetes mellitus with hyperosmolar hyperglycemic state (HHS) (HCC)  - POCT glycosylated hemoglobin (Hb A1C) - atorvastatin (LIPITOR) 20 MG tablet; Take 2 tablets (40 mg total) by mouth daily.  Dispense: 60 tablet; Refill: 1 - metFORMIN (GLUCOPHAGE) 500 MG tablet; Take 2 tablets (1,000 mg total) by mouth 2 (two) times daily with a meal.  Dispense: 360 tablet; Refill: 1   Follow up:  Follow up in 3 months

## 2022-11-20 ENCOUNTER — Other Ambulatory Visit: Payer: Self-pay | Admitting: Physician Assistant

## 2022-11-20 DIAGNOSIS — E1100 Type 2 diabetes mellitus with hyperosmolarity without nonketotic hyperglycemic-hyperosmolar coma (NKHHC): Secondary | ICD-10-CM

## 2022-12-20 ENCOUNTER — Emergency Department (HOSPITAL_COMMUNITY)
Admission: EM | Admit: 2022-12-20 | Discharge: 2022-12-21 | Disposition: A | Discharge status: Home / Self Care | Payer: Self-pay | Attending: Emergency Medicine | Admitting: Emergency Medicine

## 2022-12-20 ENCOUNTER — Emergency Department (HOSPITAL_COMMUNITY): Payer: Self-pay

## 2022-12-20 ENCOUNTER — Other Ambulatory Visit: Payer: Self-pay

## 2022-12-20 ENCOUNTER — Encounter (HOSPITAL_COMMUNITY): Payer: Self-pay

## 2022-12-20 DIAGNOSIS — W01198A Fall on same level from slipping, tripping and stumbling with subsequent striking against other object, initial encounter: Secondary | ICD-10-CM | POA: Insufficient documentation

## 2022-12-20 DIAGNOSIS — R2681 Unsteadiness on feet: Secondary | ICD-10-CM | POA: Insufficient documentation

## 2022-12-20 DIAGNOSIS — R42 Dizziness and giddiness: Secondary | ICD-10-CM | POA: Insufficient documentation

## 2022-12-20 DIAGNOSIS — Z7982 Long term (current) use of aspirin: Secondary | ICD-10-CM | POA: Insufficient documentation

## 2022-12-20 LAB — URINALYSIS, ROUTINE W REFLEX MICROSCOPIC
Bacteria, UA: NONE SEEN
Bilirubin Urine: NEGATIVE
Glucose, UA: >=500 mg/dL — AB
Hgb urine dipstick: NEGATIVE
Ketones, ur: NEGATIVE mg/dL
Leukocytes,Ua: NEGATIVE
Nitrite: NEGATIVE
Protein, ur: NEGATIVE mg/dL
Specific Gravity, Urine: 1.014 (ref 1.005–1.030)
pH: 5 (ref 5.0–8.0)

## 2022-12-20 LAB — BASIC METABOLIC PANEL
Anion gap: 9 (ref 5–15)
BUN: 30 mg/dL — ABNORMAL HIGH (ref 8–23)
CO2: 23 mmol/L (ref 22–32)
Calcium: 8.8 mg/dL — ABNORMAL LOW (ref 8.9–10.3)
Chloride: 104 mmol/L (ref 98–111)
Creatinine, Ser: 1.1 mg/dL (ref 0.61–1.24)
GFR, Estimated: >60 mL/min (ref >60)
Glucose, Bld: 123 mg/dL — ABNORMAL HIGH (ref 70–99)
Potassium: 4.3 mmol/L (ref 3.5–5.1)
Sodium: 136 mmol/L (ref 135–145)

## 2022-12-20 LAB — CBC
HCT: 36.7 % — ABNORMAL LOW (ref 39.0–52.0)
Hemoglobin: 11.9 g/dL — ABNORMAL LOW (ref 13.0–17.0)
MCH: 26.8 pg (ref 26.0–34.0)
MCHC: 32.4 g/dL (ref 30.0–36.0)
MCV: 82.7 fL (ref 80.0–100.0)
Platelets: 320 10*3/uL (ref 150–400)
RBC: 4.44 MIL/uL (ref 4.22–5.81)
RDW: 15.9 % — ABNORMAL HIGH (ref 11.5–15.5)
WBC: 8.6 10*3/uL (ref 4.0–10.5)
nRBC: 0 % (ref 0.0–0.2)

## 2022-12-20 LAB — CBG MONITORING, ED: Glucose-Capillary: 126 mg/dL — ABNORMAL HIGH (ref 70–99)

## 2022-12-20 MED ORDER — MECLIZINE HCL 25 MG PO TABS
12.5000 mg | ORAL_TABLET | Freq: Once | ORAL | Status: AC
  Administered 2022-12-20: 12.5 mg via ORAL
  Filled 2022-12-20: qty 1

## 2022-12-20 MED ORDER — MECLIZINE HCL 12.5 MG PO TABS: 12.5000 mg | ORAL_TABLET | Freq: Three times a day (TID) | ORAL | 0 refills | Status: AC

## 2022-12-20 NOTE — ED Provider Notes (Signed)
[facility] AT [facility] Provider Note   CSN: [id number] Arrival date & time: 12/20/22  1440     History  Chief Complaint  Patient presents with   Dizziness   Fall    T[facility] Gonzalez is a 67 y.o. male.  HPI Presents with dizziness, unsteadiness. Onset was yesterday about 20 hours ago.  Since that time he had persistent dizziness, including 1 episode of severity resulting in fall.  No pain anywhere, no vision changes, no speech deficits.  No obvious precipitating, exacerbating or alleviating factors.     Home Medications Prior to Admission medications   Medication Sig Start Date End Date Taking? Authorizing Provider  meclizine (ANTIVERT) 12.5 MG tablet Take 1 tablet (12.5 mg total) by mouth 3 (three) times daily. 12/20/22  Yes Gerhard Munch, MD  amLODipine (NORVASC) 5 MG tablet Take 1 tablet (5 mg total) by mouth daily. 11/02/22   Ivonne Andrew, NP  aspirin EC 81 MG tablet Take 1 tablet (81 mg total) by mouth daily. Swallow whole. 02/05/20   Furth, Cadence H, PA-C  atorvastatin (LIPITOR) 20 MG tablet Take 2 tablets (40 mg total) by mouth daily. 11/02/22   Ivonne Andrew, NP  benazepril (LOTENSIN) 40 MG tablet Take 1 tablet (40 mg total) by mouth daily. 11/02/22   Ivonne Andrew, NP  isosorbide mononitrate (IMDUR) 30 MG 24 hr tablet Take 1 tablet (30 mg total) by mouth daily. 11/02/22   Ivonne Andrew, NP  metFORMIN (GLUCOPHAGE) 500 MG tablet Take 2 tablets (1,000 mg total) by mouth 2 (two) times daily with a meal. 11/02/22 11/02/23  Ivonne Andrew, NP  metoprolol tartrate (LOPRESSOR) 25 MG tablet Take 1 tablet (25 mg total) by mouth 2 (two) times daily. 11/02/22 01/31/23  Ivonne Andrew, NP  Multiple Vitamins-Minerals (MULTIVITAMIN WITH MINERALS) tablet Take 1 tablet by mouth daily.    [provider]  naproxen sodium (ALEVE) 220 MG tablet Take 220-440 mg by mouth 2 (two) times daily as needed (pain).    [provider]   nitroGLYCERIN (NITROSTAT) 0.4 MG SL tablet Place 1 tablet (0.4 mg total) under the tongue every 5 (five) minutes as needed for chest pain. Patient not taking: Reported on 01/30/2022 10/11/21   Rodolph Bong, MD  ondansetron (ZOFRAN) 4 MG tablet Take 1 tablet (4 mg total) by mouth every 6 (six) hours as needed for nausea. Patient not taking: Reported on 01/30/2022 10/11/21   Rodolph Bong, MD      Allergies    Patient has no known allergies.    Review of Systems   Review of Systems  All other systems reviewed and are negative.   Physical Exam Updated Vital Signs BP 117/69   Pulse (!) 55   Temp 97.8 F (36.6 C) (Oral)   Resp 16   Ht 5\' 6"  (1.676 m)   Wt 80.7 kg   SpO2 98%   BMI 28.73 kg/m  Physical Exam Vitals and nursing note reviewed.  Constitutional:      General: He is not in acute distress.    Appearance: He is well-developed.  HENT:     Head: Normocephalic and atraumatic.  Eyes:     Conjunctiva/sclera: Conjunctivae normal.  Cardiovascular:     Rate and Rhythm: Normal rate and regular rhythm.  Pulmonary:     Effort: Pulmonary effort is normal. No respiratory distress.     Breath sounds: No stridor.  Abdominal:     General:  There is no distension.  Skin:    General: Skin is warm and dry.  Neurological:     Mental Status: He is alert and oriented to person, place, and time.     Cranial Nerves: No cranial nerve deficit.     Motor: No weakness or atrophy.     Comments: Minimal dizziness provoked with head rotation or axial motion     ED Results / Procedures / Treatments   Labs (all labs ordered are listed, but only abnormal results are displayed) Labs Reviewed  BASIC METABOLIC PANEL - Abnormal; Notable for the following components:      Result Value   Glucose, Bld 123 (*)    BUN 30 (*)    Calcium 8.8 (*)    All other components within normal limits  CBC - Abnormal; Notable for the following components:   Hemoglobin 11.9 (*)    HCT 36.7 (*)     RDW 15.9 (*)    All other components within normal limits  URINALYSIS, ROUTINE W REFLEX MICROSCOPIC - Abnormal; Notable for the following components:   Glucose, UA >=500 (*)    All other components within normal limits  CBG MONITORING, ED - Abnormal; Notable for the following components:   Glucose-Capillary 126 (*)    All other components within normal limits    EKG EKG Interpretation Date/Time:  Thursday December 20 2022 14:33:52 EDT Ventricular Rate:  64 PR Interval:  120 QRS Duration:  132 QT Interval:  416 QTC Calculation: 429 R Axis:   255  Text Interpretation: Sinus rhythm with Premature atrial complexes Right bundle branch block Abnormal ECG Confirmed by Gerhard Munch [phone]) on 12/20/2022 8:07:12 PM  Radiology MR Brain Wo Contrast (neuro protocol)  Result Date: 12/20/2022 CLINICAL DATA:  Initial evaluation for neuro deficit, stroke suspected. EXAM: MRI HEAD WITHOUT CONTRAST TECHNIQUE: Multiplanar, multiecho pulse sequences of the brain and surrounding structures were obtained without intravenous contrast. COMPARISON:  None Available. FINDINGS: Brain: Cerebral volume within normal limits. Mild chronic microvascular ischemic disease noted involving the supratentorial cerebral white matter. Small remote lacunar infarct at the left thalamus. Few scattered small areas of encephalomalacia and gliosis involving the anterior frontal and temporal convexities, consistent with prior traumatic brain injury. Mild associated chronic blood products noted. No evidence for acute or subacute ischemia. Gray-white matter differentiation otherwise maintained. No other acute or chronic intracranial blood products. No mass lesion, midline shift or mass effect no hydrocephalus or extra-axial fluid collection. Pituitary gland suprasellar region within normal limits. Vascular: Major intracranial vascular flow voids are maintained. Skull and upper cervical spine: Craniocervical junction within normal limits.  Moderately advanced spondylosis within the visualized upper cervical spine. Bone marrow signal intensity within normal limits. No scalp soft tissue abnormality. Sinuses/Orbits: Globes and orbital soft tissues within normal limits. Right maxillary sinus retention cyst. Paranasal sinuses are otherwise clear. No significant mastoid effusion. Other: None. IMPRESSION: 1. No acute intracranial abnormality. 2. Few scattered small areas of encephalomalacia and gliosis involving the anterior frontal and temporal convexities, consistent with prior traumatic brain injury. 3. Mild chronic microvascular ischemic disease with a small remote left thalamic lacunar infarct. Electronically Signed   By: Rise Mu M.D.   On: 12/20/2022 22:39    Procedures Procedures    Medications Ordered in ED Medications  meclizine (ANTIVERT) tablet 12.5 mg (12.5 mg Oral Given 12/20/22 2026)    ED Course/ Medical Decision Making/ A&P  Medical Decision Making Elderly male presents with dizziness.  Given acute onset, persistent symptoms differential including vertigo versus stroke versus vestibulitis considered. Less likely dehydration, electrolyte abnormalities given the absence of recent changes in medication. Labs sent MRI ordered. Cardiac 60 sinus normal Pulse ox 100% room air normal   Amount and/or Complexity of Data Reviewed Labs: ordered. Radiology: ordered.   11:18 PM Patient awake, alert, resting, awakens easily, negligible complaints discernible.  Patient's evaluation including MRI, labs inconsistent with stroke, electrolyte abnormalities, bacteremia, sepsis.  Patient has received meclizine, supportive of possible vertigo.  Given his reassuring evaluation here patient appropriate for further evaluation, monitoring, management with our neurology colleagues and his primary care physician as an outpatient.        Final Clinical Impression(s) / ED Diagnoses Final  diagnoses:  Dizziness    Rx / DC Orders ED Discharge Orders          Ordered    meclizine (ANTIVERT) 12.5 MG tablet  3 times daily        12/20/22 2318              Gerhard Munch, MD 12/20/22 2319

## 2022-12-20 NOTE — Discharge Instructions (Addendum)
As discussed, your evaluation today has been largely reassuring.  But, it is important that you monitor your condition carefully, and do not hesitate to return to the ED if you develop new, or concerning changes in your condition. You are starting a new medication which should assist with control of your dizziness.  Please discuss with your physician this medication.  As needed, you may follow-up with our neurology colleagues as well.

## 2022-12-20 NOTE — ED Triage Notes (Addendum)
Pt reports around 2230 last night, he felt dizzy causing him to fall and hit his head on a wall. Pt denies loc, no blood thinners and is AxOx4. Pt reports he is still experiencing dizziness. Denies any other symptoms.  No focal neurological deficits noted.

## 2022-12-22 ENCOUNTER — Other Ambulatory Visit: Payer: Self-pay | Admitting: Physician Assistant

## 2023-02-01 ENCOUNTER — Encounter: Payer: Self-pay | Admitting: Nurse Practitioner

## 2023-02-01 ENCOUNTER — Ambulatory Visit (INDEPENDENT_AMBULATORY_CARE_PROVIDER_SITE_OTHER): Payer: Self-pay | Admitting: Nurse Practitioner

## 2023-02-01 VITALS — BP 109/97 | HR 72 | Ht 66.0 in | Wt 152.0 lb

## 2023-02-01 DIAGNOSIS — Z23 Encounter for immunization: Secondary | ICD-10-CM

## 2023-02-01 DIAGNOSIS — I10 Essential (primary) hypertension: Secondary | ICD-10-CM

## 2023-02-01 DIAGNOSIS — E1100 Type 2 diabetes mellitus with hyperosmolarity without nonketotic hyperglycemic-hyperosmolar coma (NKHHC): Secondary | ICD-10-CM

## 2023-02-01 LAB — POCT GLYCOSYLATED HEMOGLOBIN (HGB A1C): Hemoglobin A1C: 8.1 % — AB (ref 4.0–5.6)

## 2023-02-01 MED ORDER — ATORVASTATIN CALCIUM 20 MG PO TABS
40.0000 mg | ORAL_TABLET | Freq: Every day | ORAL | 1 refills | Status: DC
Start: 2023-02-01 — End: 2023-06-24

## 2023-02-01 MED ORDER — ISOSORBIDE MONONITRATE ER 30 MG PO TB24
30.0000 mg | ORAL_TABLET | Freq: Every day | ORAL | 1 refills | Status: DC
Start: 2023-02-01 — End: 2023-12-03

## 2023-02-01 MED ORDER — BENAZEPRIL HCL 40 MG PO TABS
40.0000 mg | ORAL_TABLET | Freq: Every day | ORAL | 1 refills | Status: DC
Start: 2023-02-01 — End: 2023-12-03

## 2023-02-01 MED ORDER — AMLODIPINE BESYLATE 5 MG PO TABS
5.0000 mg | ORAL_TABLET | Freq: Every day | ORAL | 1 refills | Status: DC
Start: 2023-02-01 — End: 2023-12-03

## 2023-02-01 NOTE — Patient Instructions (Signed)
1. Essential hypertension  - amLODipine (NORVASC) 5 MG tablet; Take 1 tablet (5 mg total) by mouth daily.  Dispense: 90 tablet; Refill: 1 - atorvastatin (LIPITOR) 20 MG tablet; Take 2 tablets (40 mg total) by mouth daily.  Dispense: 60 tablet; Refill: 1 - benazepril (LOTENSIN) 40 MG tablet; Take 1 tablet (40 mg total) by mouth daily.  Dispense: 90 tablet; Refill: 1 - isosorbide mononitrate (IMDUR) 30 MG 24 hr tablet; Take 1 tablet (30 mg total) by mouth daily.  Dispense: 90 tablet; Refill: 1  2. Type 2 diabetes mellitus with hyperosmolar hyperglycemic state (HHS) (HCC)  - atorvastatin (LIPITOR) 20 MG tablet; Take 2 tablets (40 mg total) by mouth daily.  Dispense: 60 tablet; Refill: 1   Follow up:  Follow up in 3 months

## 2023-02-01 NOTE — Progress Notes (Signed)
Subjective   Patient ID: Todd Gonzalez, male    DOB: 1955/06/28, 67 y.o.   MRN: [id number]  Chief Complaint  Patient presents with   Follow-up    Referring provider: Ivonne Andrew, NP  KUNAL MORETA is a 67 y.o. male with Past Medical History: No date: CAD (coronary artery disease)     Comment:  ruptured plaque in OM 2003, NSTEMI/CABG 01/2020 No date: Headache(784.0) No date: HLD (hyperlipidemia) No date: HTN (hypertension) No date: Mild carotid artery disease (HCC) No date: Pre-diabetes No date: RBBB (right bundle branch block)   HPI  Diabetes:    Patient presents today for a diabetes follow up. A1C today was 8.1. Will continue medication at current dosage. Denies f/c/s, n/v/d, hemoptysis, PND, leg swelling Denies chest pain or edema     Hypertension:   Patient presents for follow-up on hypertension.  Patient is currently on isosorbide, benazepril, amlodipine.  Patient is also on atorvastatin for cholesterol but does not have this in his bag with his other medications today.  We will send in a refill for this today along with his other medications.  Blood pressure stable in office today. Denies f/c/s, n/v/d, hemoptysis, PND, leg swelling Denies chest pain or edema   No Known Allergies   There is no immunization history on file for this patient.  Tobacco History: Social History   Tobacco Use  Smoking Status Some Days   Current packs/day: 1.00   Types: Cigarettes  Smokeless Tobacco Never  Tobacco Comments   1 pack every 4 days   Ready to quit: Not Answered Counseling given: Not Answered Tobacco comments: 1 pack every 4 days   Outpatient Encounter Medications as of 02/01/2023  Medication Sig   aspirin EC 81 MG tablet Take 1 tablet (81 mg total) by mouth daily. Swallow whole.   meclizine (ANTIVERT) 12.5 MG tablet Take 1 tablet (12.5 mg total) by mouth 3 (three) times daily.   metFORMIN (GLUCOPHAGE) 500 MG tablet Take 2 tablets (1,000 mg total) by  mouth 2 (two) times daily with a meal.   Multiple Vitamins-Minerals (MULTIVITAMIN WITH MINERALS) tablet Take 1 tablet by mouth daily.   naproxen sodium (ALEVE) 220 MG tablet Take 220-440 mg by mouth 2 (two) times daily as needed (pain).   nitroGLYCERIN (NITROSTAT) 0.4 MG SL tablet Place 1 tablet (0.4 mg total) under the tongue every 5 (five) minutes as needed for chest pain.   ondansetron (ZOFRAN) 4 MG tablet Take 1 tablet (4 mg total) by mouth every 6 (six) hours as needed for nausea.   [DISCONTINUED] amLODipine (NORVASC) 5 MG tablet Take 1 tablet (5 mg total) by mouth daily.   [DISCONTINUED] atorvastatin (LIPITOR) 20 MG tablet Take 2 tablets (40 mg total) by mouth daily.   [DISCONTINUED] benazepril (LOTENSIN) 40 MG tablet Take 1 tablet (40 mg total) by mouth daily.   [DISCONTINUED] isosorbide mononitrate (IMDUR) 30 MG 24 hr tablet Take 1 tablet (30 mg total) by mouth daily.   amLODipine (NORVASC) 5 MG tablet Take 1 tablet (5 mg total) by mouth daily.   atorvastatin (LIPITOR) 20 MG tablet Take 2 tablets (40 mg total) by mouth daily.   benazepril (LOTENSIN) 40 MG tablet Take 1 tablet (40 mg total) by mouth daily.   isosorbide mononitrate (IMDUR) 30 MG 24 hr tablet Take 1 tablet (30 mg total) by mouth daily.   metoprolol tartrate (LOPRESSOR) 25 MG tablet Take 1 tablet (25 mg total) by mouth 2 (two) times daily.  No facility-administered encounter medications on file as of 02/01/2023.    Review of Systems  Review of Systems  Constitutional: Negative.   HENT: Negative.    Cardiovascular: Negative.   Gastrointestinal: Negative.   Allergic/Immunologic: Negative.   Neurological: Negative.   Psychiatric/Behavioral: Negative.       Objective:   BP (!) 109/97 (BP Location: Right Arm, Patient Position: Sitting, Cuff Size: Normal)   Pulse 72   Ht 5\' 6"  (1.676 m)   Wt 152 lb (68.9 kg)   SpO2 97%   BMI 24.53 kg/m   Wt Readings from Last 5 Encounters:  02/01/23 152 lb (68.9 kg)  12/20/22  178 lb (80.7 kg)  11/02/22 153 lb 3.2 oz (69.5 kg)  07/27/22 161 lb 3.2 oz (73.1 kg)  04/27/22 158 lb (71.7 kg)     Physical Exam Vitals and nursing note reviewed.  Constitutional:      General: He is not in acute distress.    Appearance: He is well-developed.  Cardiovascular:     Rate and Rhythm: Normal rate and regular rhythm.  Pulmonary:     Effort: Pulmonary effort is normal.     Breath sounds: Normal breath sounds.  Skin:    General: Skin is warm and dry.  Neurological:     Mental Status: He is alert and oriented to person, place, and time.       Assessment & Plan:   Essential hypertension -     amLODIPine Besylate; Take 1 tablet (5 mg total) by mouth daily.  Dispense: 90 tablet; Refill: 1 -     Atorvastatin Calcium; Take 2 tablets (40 mg total) by mouth daily.  Dispense: 60 tablet; Refill: 1 -     Benazepril HCl; Take 1 tablet (40 mg total) by mouth daily.  Dispense: 90 tablet; Refill: 1 -     Isosorbide Mononitrate ER; Take 1 tablet (30 mg total) by mouth daily.  Dispense: 90 tablet; Refill: 1  Type 2 diabetes mellitus with hyperosmolar hyperglycemic state (HHS) (HCC) -     Atorvastatin Calcium; Take 2 tablets (40 mg total) by mouth daily.  Dispense: 60 tablet; Refill: 1     Return in about 3 months (around 05/03/2023).   Ivonne Andrew, NP 02/01/2023

## 2023-02-04 ENCOUNTER — Telehealth: Payer: Self-pay

## 2023-02-04 NOTE — Progress Notes (Signed)
Care Guide Note  02/04/2023 Name: Todd Gonzalez MRN: [id number] DOB: 05-03-1956  Referred by: Ivonne Andrew, NP Reason for referral : Care Coordination (Outreach to schedule with Pharm d )   Todd Gonzalez is a 67 y.o. year old male who is a primary care patient of Ivonne Andrew, NP. Todd Gonzalez was referred to the pharmacist for assistance related to DM.    An unsuccessful telephone outreach was attempted today to contact the patient who was referred to the pharmacy team for assistance with medication management. Additional attempts will be made to contact the patient.   Penne Lash, RMA Care Guide [facility]  [place], [place] [address] Direct Dial: [phone] [email or web address]

## 2023-02-08 NOTE — Progress Notes (Signed)
Care Guide Note  02/08/2023 Name: [name] MRN: [id number] DOB: [birth year]  Referred by: Ivonne Andrew, NP Reason for referral : Care Coordination (Outreach to schedule with Pharm d )   Todd Gonzalez is a 67 y.o. year old male who is a primary care patient of Ivonne Andrew, NP. Doreene Burke Forst was referred to the pharmacist for assistance related to DM.    Successful contact was made with the patient to discuss pharmacy services including being ready for the pharmacist to call at least 5 minutes before the scheduled appointment time, to have medication bottles and any blood sugar or blood pressure readings ready for review. The patient agreed to meet with the pharmacist via with the pharmacist via telephone visit on (date/time).  02/12/2023  Penne Lash, RMA Care Guide [facility]  [place], [place] [address] Direct Dial: [phone] [email or web address]

## 2023-02-12 ENCOUNTER — Other Ambulatory Visit: Payer: Self-pay | Admitting: Pharmacist

## 2023-02-12 ENCOUNTER — Telehealth: Payer: Self-pay

## 2023-02-12 VITALS — BP 124/78

## 2023-02-12 DIAGNOSIS — E1100 Type 2 diabetes mellitus with hyperosmolarity without nonketotic hyperglycemic-hyperosmolar coma (NKHHC): Secondary | ICD-10-CM

## 2023-02-12 NOTE — Telephone Encounter (Signed)
PAP: PAP application for Farxiga, Publishing rights manager ([employer])) has been mailed to pt's home address on file. Will fax provider portion of application to provider's office when pt's portion is received.  PLEASE BE ADVISED

## 2023-02-12 NOTE — Progress Notes (Unsigned)
02/12/2023 Name: Todd Gonzalez MRN: [id number] DOB: 07-31-1955  Chief Complaint  Patient presents with   Diabetes    Todd Gonzalez is a 67 y.o. year old male who presented for a telephone visit.   They were referred to the pharmacist by their PCP for assistance in managing diabetes.    Subjective:  Care Team: Primary Care Provider: Ivonne Andrew, NP ; Next Scheduled Visit: 05/10/23 Clinical Pharmacist: Marlowe Aschoff, PharmD  Medication Access/Adherence  Current Pharmacy:  [facility] [address] ([place]), [place] - [address] [place] [address] Phone: [phone] Fax: [phone]  [facility] [address] [place] [address] Phone: [phone] Fax: [phone]  [facility] [address] [place] [address] Phone: [phone] Fax: [phone]   Patient reports affordability concerns with their medications: No  Patient reports access/transportation concerns to their pharmacy: No  Patient reports adherence concerns with their medications:  No   *PCP notes patient had Atorvastatin missing but fill history says he should have some at home and also confirms taking it daily   Diabetes:  Current medications: Metformin 500mg - 2 tabs twice a day Medications tried in the past: Glimepiride 2mg  (low BG)  Patient denies hypoglycemic s/sx including dizziness, shakiness, sweating. Patient denies hyperglycemic symptoms including polyuria, polydipsia, polyphagia, nocturia, neuropathy, blurred vision.  Current meal patterns:  - Eating more carbs and sweets over the past few months than prior - Brief counseling on diet changes  Current physical activity: Works Doctor, general practice hospital and schools  Current medication access support: Has NO insurance  Tobacco Abuse:  Tobacco Use History: Number of cigarettes per  day 3-4 Reports trying to quit over the past few days and knows he needs to due to his age + overall health  Quit Attempt History: Methods tried in the past include None.  Reviewed options with the patient, but not interested in trialing medications to help him quit at this time  HLD + HTN: - Reports home BP reading on Sunday was 124/78; no concerns of dizziness - LDL almost at goal of <70 based on age and ASCVD history- continue Atorvastatin 40mg  daily   Objective:  Lab Results  Component Value Date   HGBA1C 8.1 (A) 02/01/2023    Lab Results  Component Value Date   CREATININE 1.10 12/20/2022   BUN 30 (H) 12/20/2022   NA 136 12/20/2022   K 4.3 12/20/2022   CL 104 12/20/2022   CO2 23 12/20/2022    Lab Results  Component Value Date   CHOL 163 07/27/2022   HDL 75 07/27/2022   LDLCALC 76 07/27/2022   TRIG 59 07/27/2022   CHOLHDL 2.2 07/27/2022    Medications Reviewed Today     Reviewed by Pollie Friar, RPH (Pharmacist) on 02/12/23 at [phone]  Med List Status: <None>   Medication Order Taking? Sig Documenting Provider Last Dose Status Informant  amLODipine (NORVASC) 5 MG tablet [id number] Yes Take 1 tablet (5 mg total) by mouth daily. Ivonne Andrew, NP Taking Active   aspirin EC 81 MG tablet [id number] Yes Take 1 tablet (81 mg total) by mouth daily. Swallow whole. Furth, Cadence H, PA-C Taking Active Self  atorvastatin (LIPITOR) 20 MG tablet [id number] Yes Take 2 tablets (40 mg total) by mouth daily. Ivonne Andrew, NP Taking Active   benazepril (LOTENSIN) 40 MG tablet [id number] Yes Take 1 tablet (  40 mg total) by mouth daily. Ivonne Andrew, NP Taking Active   isosorbide mononitrate (IMDUR) 30 MG 24 hr tablet [id number] Yes Take 1 tablet (30 mg total) by mouth daily. Ivonne Andrew, NP Taking Active   meclizine (ANTIVERT) 12.5 MG tablet [id number] Yes Take 1 tablet (12.5 mg total) by mouth 3 (three) times daily. Gerhard Munch, MD Taking Active   metFORMIN  (GLUCOPHAGE) 500 MG tablet [id number] Yes Take 2 tablets (1,000 mg total) by mouth 2 (two) times daily with a meal. Ivonne Andrew, NP Taking Active   metoprolol tartrate (LOPRESSOR) 25 MG tablet [id number] Yes Take 1 tablet (25 mg total) by mouth 2 (two) times daily. Ivonne Andrew, NP Taking Active   Multiple Vitamins-Minerals (MULTIVITAMIN WITH MINERALS) tablet [id number] Yes Take 1 tablet by mouth daily. [provider] Taking Active Self  naproxen sodium (ALEVE) 220 MG tablet [id number] Yes Take 220-440 mg by mouth 2 (two) times daily as needed (pain). [provider] Taking Active Self  nitroGLYCERIN (NITROSTAT) 0.4 MG SL tablet [id number] Yes Place 1 tablet (0.4 mg total) under the tongue every 5 (five) minutes as needed for chest pain. Rodolph Bong, MD Taking Active   ondansetron [facility]) 4 MG tablet [id number] Yes Take 1 tablet (4 mg total) by mouth every 6 (six) hours as needed for nausea. Rodolph Bong, MD Taking Active               Assessment/Plan:   Diabetes: - Currently uncontrolled - Reviewed long term cardiovascular and renal outcomes of uncontrolled blood sugar - Reviewed goal A1c, goal fasting, and goal 2 hour post prandial glucose - Reviewed dietary modifications including minimizing carbs and sweets - Recommend to check glucose daily - Meets financial criteria for Farxiga 10mg  patient assistance program through [employer]. Will collaborate with provider, CPhT, and patient to pursue assistance.  - Counseled on importance of hydration and mechanism of action of Farxiga     Tobacco Abuse - Currently uncontrolled - Provided motivational interviewing to assess tobacco use and strategies for reduction - Provided information on 1 800 QUIT NOW support program - NOT interested in trialing medication to help quit at this time  **Follow Up Plan:** - Follow-up in 1 month - Start Farxiga 10mg  PAP process- meets income criteria and has NO insurance  (doesn't qualify for Medicare) - Get BG readings at next visit and focus on diet further - Ask about smoking cessation- deferred new medication options currently to assist as he wants to do it on his own; currently 3-4 cigarettes daily   Length of visit: 14 minutes  Marlowe Aschoff, PharmD [facility] Phone Number: [phone]

## 2023-02-14 MED ORDER — DAPAGLIFLOZIN PROPANEDIOL 10 MG PO TABS
10.0000 mg | ORAL_TABLET | Freq: Every day | ORAL | 3 refills | Status: DC
Start: 2023-02-14 — End: 2023-04-15

## 2023-03-12 ENCOUNTER — Other Ambulatory Visit: Payer: Self-pay | Admitting: Pharmacist

## 2023-03-12 NOTE — Progress Notes (Signed)
03/12/2023 Name: Todd Gonzalez MRN: [id number] DOB: 1956/03/26  Chief Complaint  Patient presents with   Diabetes    Todd Gonzalez is a 67 y.o. year old male who presented for a telephone visit.   They were referred to the pharmacist by their PCP for assistance in managing diabetes.    Subjective:  Care Team: Primary Care Provider: Ivonne Andrew, NP ; Next Scheduled Visit: 05/10/23 Clinical Pharmacist: Marlowe Aschoff, PharmD  Medication Access/Adherence  Current Pharmacy:  [facility] [address] ([place]), [place] - [address] [place] ([place]) [place] [address] Phone: [phone] Fax: [phone]  [facility] [address] [place] [address] Phone: [phone] Fax: [phone]  [facility] - [facility] [address] [place] [address] Phone: [phone] Fax: [phone]  [facility] - [place], [place] - [address] [place] [address] Phone: [phone] Fax: [phone]   Patient reports affordability concerns with their medications: No  Patient reports access/transportation concerns to their pharmacy: No  Patient reports adherence concerns with their medications:  No      Diabetes:  Current medications: Metformin 500mg - 2 tabs twice a day Medications tried in the past: Glimepiride 2mg  (low BG)  Patient denies hypoglycemic s/sx including dizziness, shakiness, sweating. Patient denies hyperglycemic symptoms including polyuria, polydipsia, polyphagia, nocturia, neuropathy, blurred vision.  BG readings: 210 this morning (usually in this range per patient)  Current meal patterns:  - Eating more carbs and sweets over the past few months than prior - Eats 1-2 meals per day - Eats brown rice, potatoes, fufu, [place] or fish, mixed vegetables - Drinks: Water, sweet tea with milk occasionally, NO  coffee  Current physical activity: Works Doctor, general practice hospital and schools  Current medication access support: Has NO insurance  Tobacco Abuse:  Tobacco Use History: Number of cigarettes per day 3-4 Reports trying to quit over the past few days and knows he needs to due to his age + overall health  Quit Attempt History: Methods tried in the past include None.  Reviewed options with the patient, but not interested in trialing medications to help him quit at this time  HLD + HTN: - Reports home BP reading on Sunday was 124/78; no concerns of dizziness - LDL almost at goal of <70 based on age and ASCVD history- continue Atorvastatin 40mg  daily   Objective:  Lab Results  Component Value Date   HGBA1C 8.1 (A) 02/01/2023    Lab Results  Component Value Date   CREATININE 1.10 12/20/2022   BUN 30 (H) 12/20/2022   NA 136 12/20/2022   K 4.3 12/20/2022   CL 104 12/20/2022   CO2 23 12/20/2022    Lab Results  Component Value Date   CHOL 163 07/27/2022   HDL 75 07/27/2022   LDLCALC 76 07/27/2022   TRIG 59 07/27/2022   CHOLHDL 2.2 07/27/2022    Medications Reviewed Today     Reviewed by Pollie Friar, RPH (Pharmacist) on 03/12/23 at 0929  Med List Status: <None>   Medication Order Taking? Sig Documenting Provider Last Dose Status Informant  amLODipine (NORVASC) 5 MG tablet [id number] Yes Take 1 tablet (5 mg total) by mouth daily. Ivonne Andrew, NP Taking Active   aspirin EC 81 MG tablet [id number] Yes Take 1 tablet (81 mg total) by mouth daily. Swallow whole. Fransico Michael, Cadence H, PA-C Taking  Active Self  atorvastatin (LIPITOR) 20 MG tablet [id number] Yes Take 2 tablets (40 mg total) by mouth daily. Ivonne Andrew, NP Taking Active   benazepril (LOTENSIN) 40 MG tablet [id number] Yes Take 1 tablet (40 mg total) by mouth daily. Ivonne Andrew, NP Taking Active   dapagliflozin propanediol (FARXIGA) 10 MG TABS tablet [id number] No Take 1 tablet (10 mg total) by mouth daily  before breakfast.  Patient not taking: Reported on 03/12/2023   Ivonne Andrew, NP Not Taking Active   isosorbide mononitrate (IMDUR) 30 MG 24 hr tablet [id number] Yes Take 1 tablet (30 mg total) by mouth daily. Ivonne Andrew, NP Taking Active   meclizine (ANTIVERT) 12.5 MG tablet [id number] Yes Take 1 tablet (12.5 mg total) by mouth 3 (three) times daily. Gerhard Munch, MD Taking Active   metFORMIN (GLUCOPHAGE) 500 MG tablet [id number] Yes Take 2 tablets (1,000 mg total) by mouth 2 (two) times daily with a meal. Ivonne Andrew, NP Taking Active   metoprolol tartrate (LOPRESSOR) 25 MG tablet [id number]  Take 1 tablet (25 mg total) by mouth 2 (two) times daily. Ivonne Andrew, NP  Expired 02/12/23 2359   Multiple Vitamins-Minerals (MULTIVITAMIN WITH MINERALS) tablet [id number] Yes Take 1 tablet by mouth daily. [provider] Taking Active Self  naproxen sodium (ALEVE) 220 MG tablet [id number] Yes Take 220-440 mg by mouth 2 (two) times daily as needed (pain). [provider] Taking Active Self  nitroGLYCERIN (NITROSTAT) 0.4 MG SL tablet [id number] Yes Place 1 tablet (0.4 mg total) under the tongue every 5 (five) minutes as needed for chest pain. Rodolph Bong, MD Taking Active   ondansetron [facility]) 4 MG tablet [id number] Yes Take 1 tablet (4 mg total) by mouth every 6 (six) hours as needed for nausea. Rodolph Bong, MD Taking Active               Assessment/Plan:   Diabetes: - Currently uncontrolled - Reviewed long term cardiovascular and renal outcomes of uncontrolled blood sugar - Reviewed goal A1c, goal fasting, and goal 2 hour post prandial glucose - Reviewed dietary modifications including minimizing carbs and sweets - Recommend to check glucose daily - Meets financial criteria for Farxiga 10mg  patient assistance program through [place]. Will collaborate with provider, CPhT, and patient to pursue assistance.  - Counseled on importance of hydration and  mechanism of action of Farxiga     Tobacco Abuse - Currently uncontrolled - Provided motivational interviewing to assess tobacco use and strategies for reduction - Provided information on 1 800 QUIT NOW support program - NOT interested in trialing medication to help quit at this time  **Follow Up Plan:** - Follow-up in 1 month - See if we are able to get Farxiga or Rybelsus application completed- if not, switch to Trulicity with [facility] - Reports NOT receiving Farxiga PAP in mail and cannot get online application to process for [employer] or [employer] currently - For future, ask about smoking cessation- deferred new medication options currently to assist as he wants to do it on his own; currently 3-4 cigarettes daily    Marlowe Aschoff, PharmD [facility] Phone Number: [phone]

## 2023-03-15 ENCOUNTER — Telehealth: Payer: Self-pay | Admitting: Pharmacist

## 2023-03-15 NOTE — Progress Notes (Signed)
   03/15/2023  Patient ID: Todd Gonzalez, male   DOB: 21-Dec-1955, 67 y.o.   MRN: [id number]  Called patient and left voicemail regarding Farxiga PAP. Left message seeing if he could stop by the office on Tuesday or Thursday next week to sign the application at the front desk. There is a pharmacist in the office that can assist with getting the application submitted- we just need his signature attached for submission (preferred medication with cardiovascular history). Requested call back at earliest convenience.   Marlowe Aschoff, PharmD [facility] Phone Number: [phone]

## 2023-03-19 ENCOUNTER — Telehealth: Payer: Self-pay | Admitting: Pharmacist

## 2023-03-19 NOTE — Progress Notes (Signed)
   03/19/2023  Patient ID: Todd Gonzalez, male   DOB: Oct 02, 1955, 67 y.o.   MRN: [id number]  Spoke with the patient on the phone. Reports he saw me call last week, but did not have time to return the call yet. Says he was planning on stopping in today, but woke up dizzy. Has been sitting in bed for 30 minutes trying to officially get up for the day. Said this started yesterday. Plans to go into the office to sign the application at the front desk next Tuesday. Advised I would have it ready for Thursday just in case he changes his mind.    Marlowe Aschoff, PharmD [facility] Phone Number: [phone]

## 2023-04-15 ENCOUNTER — Telehealth: Payer: Self-pay | Admitting: Pharmacist

## 2023-04-15 ENCOUNTER — Other Ambulatory Visit: Payer: Self-pay | Admitting: Pharmacist

## 2023-04-15 DIAGNOSIS — E1100 Type 2 diabetes mellitus with hyperosmolarity without nonketotic hyperglycemic-hyperosmolar coma (NKHHC): Secondary | ICD-10-CM

## 2023-04-15 MED ORDER — GLIPIZIDE ER 5 MG PO TB24
5.0000 mg | ORAL_TABLET | Freq: Every day | ORAL | 2 refills | Status: DC
Start: 2023-04-15 — End: 2023-07-10

## 2023-04-15 NOTE — Progress Notes (Addendum)
   04/15/2023  Patient ID: Todd Gonzalez, male   DOB: [birth year], 67 y.o.   MRN: [id number]  Called and spoke with [employer] regarding Farxiga application. They report that the patient must apply to [employer] due to low income. Patient does not qualify for this program, so therefore cannot get the [place].   Due to this, will ask about trialing Glipizide XL 5mg  daily with breakfast. Patient had low BG with Glimepiride previously, but hopefully this formulation will do better and FBG is in 200's each morning currently.   Will discuss with Dr. Tanda Rockers.   Patient reports he accidentally went to the office again for phone visit today- will make all future phone visits as a note rather than "booking" them at this point.   On 04/22/23:  - Called the pharmacy and they report his Glipizide has been ready for 8 days now. - Patient reports he had to find his money first, but plans to get it tomorrow - Reminded of 12/20 appt with Dr. Melchor Amour, PharmD [facility] Phone Number: [phone]

## 2023-04-15 NOTE — Progress Notes (Unsigned)
Pharmacy Medication Assistance Program Note    04/15/2023  Patient ID: Todd Gonzalez, male   DOB: 10-Oct-1955, 67 y.o.   MRN: [id number]     04/15/2023  Outreach Medication One  Initial Outreach Date (Medication One) 02/12/2023  Manufacturer Medication One [employer]  [employer] [facility] Farxiga  Dose of Farxiga 10MG   Type of Radiographer, therapeutic Assistance  Date Application Sent to Prescriber 04/15/2023  Name of Prescriber [employer]       Signature

## 2023-04-15 NOTE — Telephone Encounter (Signed)
RECEIVED PT PAGES AND FAXED PROVIDER PAGES TO PROVIDER OFFICE OF TONYA NICHOLS For FARXIGA ([employer]).   PLEASE BE ADVISED

## 2023-04-16 ENCOUNTER — Other Ambulatory Visit (HOSPITAL_COMMUNITY): Payer: Self-pay

## 2023-04-16 NOTE — Telephone Encounter (Signed)
PAP: Patient has been denied for pt assistance by PAP Companies: [employer] due to patient below income threshold for program, needs to apply for Extra Help Low Income Subsidy (LIS) FROM THE STATE PT DOSE NOT QUALITY FOR MEDICARE.

## 2023-04-24 ENCOUNTER — Other Ambulatory Visit: Payer: Self-pay | Admitting: Nurse Practitioner

## 2023-04-24 DIAGNOSIS — I10 Essential (primary) hypertension: Secondary | ICD-10-CM

## 2023-05-10 ENCOUNTER — Encounter: Payer: Self-pay | Admitting: Nurse Practitioner

## 2023-05-10 ENCOUNTER — Ambulatory Visit (INDEPENDENT_AMBULATORY_CARE_PROVIDER_SITE_OTHER): Payer: Self-pay | Admitting: Nurse Practitioner

## 2023-05-10 VITALS — BP 129/64 | HR 73 | Temp 97.0°F | Wt 159.2 lb

## 2023-05-10 DIAGNOSIS — E1100 Type 2 diabetes mellitus with hyperosmolarity without nonketotic hyperglycemic-hyperosmolar coma (NKHHC): Secondary | ICD-10-CM

## 2023-05-10 LAB — POCT GLYCOSYLATED HEMOGLOBIN (HGB A1C): Hemoglobin A1C: 6.8 % — AB (ref 4.0–5.6)

## 2023-05-10 NOTE — Progress Notes (Signed)
Subjective   Patient ID: Todd Gonzalez, male    DOB: 06/05/1955, 67 y.o.   MRN: [id number]  Chief Complaint  Patient presents with   Hypertension   Hyperlipidemia   Diabetes    Blood sugars was up last night     Referring provider: Ivonne Andrew, NP  Todd Gonzalez is a 67 y.o. male with Past Medical History: No date: CAD (coronary artery disease)     Comment:  ruptured plaque in OM 2003, NSTEMI/CABG 01/2020 No date: Headache(784.0) No date: HLD (hyperlipidemia) No date: HTN (hypertension) No date: Mild carotid artery disease (HCC) No date: Pre-diabetes No date: RBBB (right bundle branch block)   HPI  Diabetes:    Patient presents today for a diabetes follow up. A1C today was 6.8. Will continue medication at current dosage. Denies f/c/s, n/v/d, hemoptysis, PND, leg swelling Denies chest pain or edema     Hypertension:   Patient presents for follow-up on hypertension.  Patient is currently on isosorbide, benazepril, amlodipine.  Patient is also on atorvastatin for cholesterol but does not have this in his bag with his other medications today.  We will send in a refill for this today along with his other medications.  Blood pressure stable in office today. Denies f/c/s, n/v/d, hemoptysis, PND, leg swelling Denies chest pain or edema    No Known Allergies  Immunization History  Administered Date(s) Administered   Fluad Quad(high Dose 65+) 02/01/2023    Tobacco History: Social History   Tobacco Use  Smoking Status Some Days   Current packs/day: 1.00   Types: Cigarettes  Smokeless Tobacco Never  Tobacco Comments   1 pack every 4 days   Ready to quit: Yes Counseling given: Yes Tobacco comments: 1 pack every 4 days   Outpatient Encounter Medications as of 05/10/2023  Medication Sig   amLODipine (NORVASC) 5 MG tablet Take 1 tablet (5 mg total) by mouth daily.   aspirin EC 81 MG tablet Take 1 tablet (81 mg total) by mouth daily. Swallow whole.    atorvastatin (LIPITOR) 20 MG tablet Take 2 tablets (40 mg total) by mouth daily.   benazepril (LOTENSIN) 40 MG tablet Take 1 tablet (40 mg total) by mouth daily.   glipiZIDE (GLIPIZIDE XL) 5 MG 24 hr tablet Take 1 tablet (5 mg total) by mouth daily with breakfast.   isosorbide mononitrate (IMDUR) 30 MG 24 hr tablet Take 1 tablet (30 mg total) by mouth daily.   meclizine (ANTIVERT) 12.5 MG tablet Take 1 tablet (12.5 mg total) by mouth 3 (three) times daily.   metFORMIN (GLUCOPHAGE) 500 MG tablet Take 2 tablets (1,000 mg total) by mouth 2 (two) times daily with a meal.   metoprolol tartrate (LOPRESSOR) 25 MG tablet Take 1 tablet by mouth twice daily   naproxen sodium (ALEVE) 220 MG tablet Take 220-440 mg by mouth 2 (two) times daily as needed (pain).   ondansetron (ZOFRAN) 4 MG tablet Take 1 tablet (4 mg total) by mouth every 6 (six) hours as needed for nausea.   Multiple Vitamins-Minerals (MULTIVITAMIN WITH MINERALS) tablet Take 1 tablet by mouth daily. (Patient not taking: Reported on 05/10/2023)   nitroGLYCERIN (NITROSTAT) 0.4 MG SL tablet Place 1 tablet (0.4 mg total) under the tongue every 5 (five) minutes as needed for chest pain. (Patient not taking: Reported on 05/10/2023)   No facility-administered encounter medications on file as of 05/10/2023.    Review of Systems  Review of Systems  Constitutional: Negative.  HENT: Negative.    Cardiovascular: Negative.   Gastrointestinal: Negative.   Allergic/Immunologic: Negative.   Neurological: Negative.   Psychiatric/Behavioral: Negative.       Objective:   BP 129/64   Pulse 73   Temp (!) 97 F (36.1 C)   Wt 159 lb 3.2 oz (72.2 kg)   SpO2 99%   BMI 25.70 kg/m   Wt Readings from Last 5 Encounters:  05/10/23 159 lb 3.2 oz (72.2 kg)  02/01/23 152 lb (68.9 kg)  12/20/22 178 lb (80.7 kg)  11/02/22 153 lb 3.2 oz (69.5 kg)  07/27/22 161 lb 3.2 oz (73.1 kg)     Physical Exam Vitals and nursing note reviewed.  Constitutional:       General: He is not in acute distress.    Appearance: He is well-developed.  Cardiovascular:     Rate and Rhythm: Normal rate and regular rhythm.  Pulmonary:     Effort: Pulmonary effort is normal.     Breath sounds: Normal breath sounds.  Skin:    General: Skin is warm and dry.  Neurological:     Mental Status: He is alert and oriented to person, place, and time.       Assessment & Plan:   Type 2 diabetes mellitus with hyperosmolar hyperglycemic state (HHS) (HCC) -     POCT glycosylated hemoglobin (Hb A1C) -     CBC -     Comprehensive metabolic panel     Return in about 6 months (around 11/08/2023).   Ivonne Andrew, NP 05/10/2023

## 2023-05-10 NOTE — Patient Instructions (Signed)
1. Type 2 diabetes mellitus with hyperosmolar hyperglycemic state (HHS) (HCC) (Primary)  - POCT glycosylated hemoglobin (Hb A1C) - CBC - Comprehensive metabolic panel  Follow up:  Follow up in 6 months

## 2023-05-11 LAB — COMPREHENSIVE METABOLIC PANEL
ALT: 14 [IU]/L (ref 0–44)
AST: 15 [IU]/L (ref 0–40)
Albumin: 3.8 g/dL — ABNORMAL LOW (ref 3.9–4.9)
Alkaline Phosphatase: 63 [IU]/L (ref 44–121)
BUN/Creatinine Ratio: 18 (ref 10–24)
BUN: 17 mg/dL (ref 8–27)
Bilirubin Total: 0.3 mg/dL (ref 0.0–1.2)
CO2: 23 mmol/L (ref 20–29)
Calcium: 9 mg/dL (ref 8.6–10.2)
Chloride: 108 mmol/L — ABNORMAL HIGH (ref 96–106)
Creatinine, Ser: 0.96 mg/dL (ref 0.76–1.27)
Globulin, Total: 2.6 g/dL (ref 1.5–4.5)
Glucose: 62 mg/dL — ABNORMAL LOW (ref 70–99)
Potassium: 4.2 mmol/L (ref 3.5–5.2)
Sodium: 144 mmol/L (ref 134–144)
Total Protein: 6.4 g/dL (ref 6.0–8.5)
eGFR: 86 mL/min/{1.73_m2} (ref >59)

## 2023-05-11 LAB — CBC
Hematocrit: 34.7 % — ABNORMAL LOW (ref 37.5–51.0)
Hemoglobin: 11.3 g/dL — ABNORMAL LOW (ref 13.0–17.7)
MCH: 28.6 pg (ref 26.6–33.0)
MCHC: 32.6 g/dL (ref 31.5–35.7)
MCV: 88 fL (ref 79–97)
Platelets: 280 10*3/uL (ref 150–450)
RBC: 3.95 x10E6/uL — ABNORMAL LOW (ref 4.14–5.80)
RDW: 14.3 % (ref 11.6–15.4)
WBC: 7.8 10*3/uL (ref 3.4–10.8)

## 2023-05-13 ENCOUNTER — Telehealth: Payer: Self-pay

## 2023-05-13 ENCOUNTER — Telehealth: Payer: Self-pay | Admitting: Pharmacist

## 2023-05-13 NOTE — Telephone Encounter (Signed)
Copied from CRM [id number]. Topic: General - Inquiry >> May 13, 2023  1:03 PM Suzette B wrote: Reason for CRM: Patient called in regards to missing a phone call advised the phone call was in regards to the three medications and the price of the medications.  Patient stated he has 4 bottles of the Metformin 500mg  already.

## 2023-05-13 NOTE — Progress Notes (Signed)
   05/13/2023  Patient ID: Todd Gonzalez, male   DOB: 1956-02-09, 67 y.o.   MRN: [id number]  Tried to call and speak with patient regarding BG follow-up. Appears A1c has reached goal since the last visit.   Called and spoke with the pharmacy prior to follow-up call. Requested refills and cost information for the medications due- Metformin, Glipizide ER, Atorvastatin are $28.71 all together. Aware patient has to wait on paychecks and to save enough money to afford prescriptions.   Will try again in 1 week.    Marlowe Aschoff, PharmD [facility] Phone Number: [phone]

## 2023-05-23 ENCOUNTER — Other Ambulatory Visit: Payer: Self-pay | Admitting: Nurse Practitioner

## 2023-05-23 DIAGNOSIS — I10 Essential (primary) hypertension: Secondary | ICD-10-CM

## 2023-06-23 ENCOUNTER — Other Ambulatory Visit: Payer: Self-pay | Admitting: Nurse Practitioner

## 2023-06-23 DIAGNOSIS — I10 Essential (primary) hypertension: Secondary | ICD-10-CM

## 2023-06-23 DIAGNOSIS — E1100 Type 2 diabetes mellitus with hyperosmolarity without nonketotic hyperglycemic-hyperosmolar coma (NKHHC): Secondary | ICD-10-CM

## 2023-07-10 ENCOUNTER — Telehealth: Payer: Self-pay | Admitting: Pharmacist

## 2023-07-10 DIAGNOSIS — I10 Essential (primary) hypertension: Secondary | ICD-10-CM

## 2023-07-10 DIAGNOSIS — E1100 Type 2 diabetes mellitus with hyperosmolarity without nonketotic hyperglycemic-hyperosmolar coma (NKHHC): Secondary | ICD-10-CM

## 2023-07-10 MED ORDER — METOPROLOL TARTRATE 25 MG PO TABS
25.0000 mg | ORAL_TABLET | Freq: Two times a day (BID) | ORAL | 0 refills | Status: DC
Start: 2023-07-10 — End: 2023-09-06

## 2023-07-10 MED ORDER — GLIPIZIDE ER 5 MG PO TB24
5.0000 mg | ORAL_TABLET | Freq: Every day | ORAL | 0 refills | Status: DC
Start: 2023-07-10 — End: 2023-09-06

## 2023-07-10 NOTE — Progress Notes (Signed)
   07/10/2023  Patient ID: Todd Gonzalez, male   DOB: [birth year], 68 y.o.   MRN: [id number]  Called and spoke with the patient regarding metformin adherence. Of note, sounded like he was half asleep during call.  Picked up all of his medications from [place] on 06/26/23. Since starting it on 11/02/22, he has missed 1.5 months of therapy based on pick-up and told staff that he had 4 bottles of it in December. Patient confirmed twice that he takes 2 tablets twice a day and does not have issues remembering to take it.  Patient will also need a Glipizide ER + Metoprolol refill sent to the pharmacy.    Marlowe Aschoff, PharmD [facility] Phone Number: [phone]

## 2023-09-06 ENCOUNTER — Telehealth: Payer: Self-pay | Admitting: Pharmacist

## 2023-09-06 DIAGNOSIS — E1100 Type 2 diabetes mellitus with hyperosmolarity without nonketotic hyperglycemic-hyperosmolar coma (NKHHC): Secondary | ICD-10-CM

## 2023-09-06 DIAGNOSIS — I10 Essential (primary) hypertension: Secondary | ICD-10-CM

## 2023-09-06 MED ORDER — METFORMIN HCL 500 MG PO TABS: 1000.0000 mg | ORAL_TABLET | Freq: Two times a day (BID) | ORAL | 0 refills | Status: DC

## 2023-09-06 MED ORDER — ATORVASTATIN CALCIUM 20 MG PO TABS
40.0000 mg | ORAL_TABLET | Freq: Every day | ORAL | 0 refills | Status: DC
Start: 2023-09-06 — End: 2023-12-03

## 2023-09-06 MED ORDER — GLIPIZIDE ER 5 MG PO TB24: 5.0000 mg | ORAL_TABLET | Freq: Every day | ORAL | 0 refills | Status: DC

## 2023-09-06 MED ORDER — METOPROLOL TARTRATE 25 MG PO TABS
25.0000 mg | ORAL_TABLET | Freq: Two times a day (BID) | ORAL | 0 refills | Status: DC
Start: 2023-09-06 — End: 2023-12-03

## 2023-09-09 NOTE — Progress Notes (Signed)
   09/09/2023  Patient ID: Todd Gonzalez, male   DOB: 10/13/1955, 68 y.o.   MRN: [id number]  Attempted to contact patient for medication management. Left HIPAA compliant message for patient to return my call at their convenience.    Appears patient has not picked up his medications for at least 1 month for Glipizide , metformin , lopressor , atorvastatin . Called to see reasoning and encourage adherence to medication regimen.   *Of note, passed along patient information to new pharmacist to follow-up in the future.    Delvin File, PharmD [facility]  Phone Number: [phone]

## 2023-11-12 ENCOUNTER — Other Ambulatory Visit: Payer: Self-pay

## 2023-12-03 ENCOUNTER — Other Ambulatory Visit: Payer: Self-pay

## 2023-12-03 ENCOUNTER — Other Ambulatory Visit (INDEPENDENT_AMBULATORY_CARE_PROVIDER_SITE_OTHER): Payer: Self-pay

## 2023-12-03 VITALS — BP 145/76 | HR 70

## 2023-12-03 DIAGNOSIS — E1100 Type 2 diabetes mellitus with hyperosmolarity without nonketotic hyperglycemic-hyperosmolar coma (NKHHC): Secondary | ICD-10-CM

## 2023-12-03 DIAGNOSIS — I10 Essential (primary) hypertension: Secondary | ICD-10-CM

## 2023-12-03 DIAGNOSIS — I25110 Atherosclerotic heart disease of native coronary artery with unstable angina pectoris: Secondary | ICD-10-CM

## 2023-12-03 LAB — POCT GLYCOSYLATED HEMOGLOBIN (HGB A1C): HbA1c, POC (controlled diabetic range): 7 % (ref 0.0–7.0)

## 2023-12-03 MED ORDER — ATORVASTATIN CALCIUM 40 MG PO TABS
40.0000 mg | ORAL_TABLET | Freq: Every day | ORAL | 11 refills | Status: AC
  Filled 2023-12-03: qty 30, 30d supply, fill #0
  Filled 2024-02-04: qty 30, 30d supply, fill #1
  Filled 2024-03-20: qty 30, 30d supply, fill #2
  Filled 2024-04-21: qty 30, 30d supply, fill #3
  Filled 2024-05-29: qty 30, 30d supply, fill #4

## 2023-12-03 MED ORDER — GLIPIZIDE 5 MG PO TABS
5.0000 mg | ORAL_TABLET | Freq: Every day | ORAL | 11 refills | Status: DC
Start: 2023-12-03 — End: 2024-02-04
  Filled 2023-12-03: qty 30, 30d supply, fill #0

## 2023-12-03 MED ORDER — BENAZEPRIL HCL 20 MG PO TABS
40.0000 mg | ORAL_TABLET | Freq: Every day | ORAL | 11 refills | Status: DC
Start: 2023-12-03 — End: 2024-02-04
  Filled 2023-12-03: qty 60, 30d supply, fill #0

## 2023-12-03 MED ORDER — AMLODIPINE BESYLATE 5 MG PO TABS
5.0000 mg | ORAL_TABLET | Freq: Every day | ORAL | 1 refills | Status: DC
  Filled 2023-12-03: qty 90, 90d supply, fill #0
  Filled 2024-03-24: qty 90, 90d supply, fill #1

## 2023-12-03 MED ORDER — METOPROLOL TARTRATE 25 MG PO TABS
25.0000 mg | ORAL_TABLET | Freq: Two times a day (BID) | ORAL | 11 refills | Status: DC
Start: 2023-12-03 — End: 2024-02-04
  Filled 2023-12-03: qty 60, 30d supply, fill #0
  Filled 2024-02-04: qty 60, 30d supply, fill #1

## 2023-12-03 MED ORDER — ISOSORBIDE MONONITRATE ER 30 MG PO TB24
30.0000 mg | ORAL_TABLET | Freq: Every day | ORAL | 11 refills | Status: AC
  Filled 2023-12-03: qty 30, 30d supply, fill #0
  Filled 2024-02-04 – 2024-02-12 (×2): qty 30, 30d supply, fill #1
  Filled 2024-03-24: qty 30, 30d supply, fill #2
  Filled 2024-04-21: qty 30, 30d supply, fill #3
  Filled 2024-05-29: qty 30, 30d supply, fill #4

## 2023-12-03 MED ORDER — METFORMIN HCL 500 MG PO TABS
1000.0000 mg | ORAL_TABLET | Freq: Two times a day (BID) | ORAL | 11 refills | Status: DC
  Filled 2023-12-03: qty 120, 30d supply, fill #0
  Filled 2024-02-04: qty 120, 30d supply, fill #1

## 2023-12-03 MED ORDER — ASPIRIN EC 81 MG PO TBEC
81.0000 mg | DELAYED_RELEASE_TABLET | Freq: Every day | ORAL | 3 refills | Status: AC
  Filled 2023-12-03: qty 90, 90d supply, fill #0

## 2023-12-03 NOTE — Addendum Note (Signed)
 Addended by: BRINDA LORAIN SQUIBB on: 12/03/2023 11:45 AM   Modules accepted: Level of Service

## 2023-12-03 NOTE — Progress Notes (Signed)
 12/03/2023 Name: Todd Gonzalez MRN: [id number] DOB: 1955/10/11  Chief Complaint  Patient presents with   Hypertension   Hyperlipidemia   Diabetes    Todd Gonzalez is a 68 y.o. year old male who presented for a face-to-face visit.    They were referred to the pharmacist by their PCP for assistance in managing diabetes.  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 At last visit, A1C had improved from 8.1% to 6.8%. BP was 129/64 mmHg. He has been contacted by pharmacy several times over the past few months to follow-up on suspected adherence issues.   Today, patient reports that he is doing ok. He has not had money to consistently pay for his medications and he is out or almost out of all of his medications. He has his medication bottles with him today. He would be willing to use [facility] pharmacy to obtain several of his medications for free via [facility] supply.    Care Team: Primary Care Provider: Borer Todd RAMAN, NP ; Next Scheduled Visit: needs to be scheduled  Medication Access/Adherence  Current Pharmacy:  [facility] [address] [place] [address] Phone: [phone] Fax: [phone]  [facility] [address] [place] [address] Phone: [phone] Fax: [phone]  [facility] [place], [place] - [address] [place] [address] Phone: [phone] Fax: [phone]   Patient reports affordability concerns with their medications: Yes  - no insurance Patient reports access/transportation concerns to their pharmacy: Yes  - uses the bus system, but reports he would be able to get to [facility] Patient reports adherence concerns with their medications:  Yes  - due to cost  Amlodipine  30 ds Feb 2025 Atorvastatin  30ds 10/12/23 Benazepril   30ds 10/12/23 Glipizide  30ds Feb 2025 Isosorbide  mononitrate 30ds 10/12/23 Metformin  30ds 10/12/23 Metoprolol  tartate 30ds 10/12/23   Diabetes:  Current medications: glipizide  XL 5 mg daily with breakfast (has been out for a couple months), metformin  IR 1000 mg BID with meals (has been out for a couple days)  Current glucose readings:  Using glucometer; testing once tdaily: FBG 124 mg/dL, 2 days ago. He does not have his glucometer with him.   Reports one episode of lightheadedness last week, but this does not happen often. Denies other s/sx of hypoglycemia.   Patient reports hyperglycemic symptoms including polyuria, polydipsia. Denies polyphagia, nocturia, neuropathy, blurred vision.Higher BG symptoms usually associated with sweet drink.   Current meal patterns: two meals/day (wakes up around 12-1PM). Tries to avoid pure white rice Eats wheat bread.   - Lunch - fufu, soup - Supper - did not discuss in detail.  - Drinks: sweet tea occasionally ,drinks a lot of water  during the day  Current medication access support: none- would be eligible for [facility] supply  Hypertension:  Current medications: amlodipine  5 mg daily, metoprolol  tartrate 25 mg BID, benazepril  40 mg daily, isosorbide  mononitrate 30 mg daily Medications previously tried  Had cigarette ~2 hours ago Smokes about 2-3 cigarettes per day - he reports that he is trying to cut back but is not ready to quit at this time  Patient has a validated, automated, upper arm home BP cuff Current blood pressure readings readings: reports home reading of SBP 120s a few days ago  Patient denies hypotensive s/sx including dizziness, lightheadedness.  Patient denies hypertensive symptoms including headache, chest pain, shortness of breath   Hyperlipidemia/ASCVD Risk Reduction  Current lipid lowering medications: atorvastatin  40 mg daily  Antiplatelet regimen: ASA 81 mg daily  ASCVD History: CABG, NSTEMI, unstable angina Risk Factors:  tobacco use, ASCVD, T2DM  Clinical ASCVD: Yes  The ASCVD Risk score (Arnett DK, et al., 2019) failed to calculate for the following reasons:   Risk score cannot be calculated because patient has a medical history suggesting prior/existing ASCVD    Objective:  BP Readings from Last 3 Encounters:  12/03/23 (!) 145/76  05/10/23 129/64  02/12/23 124/78    Lab Results  Component Value Date   HGBA1C 7.0 12/03/2023   HGBA1C 6.8 (A) 05/10/2023   HGBA1C 8.1 (A) 02/01/2023       Latest Ref Rng & Units 05/10/2023   10:19 AM 12/20/2022    3:07 PM 07/27/2022   10:25 AM  BMP  Glucose 70 - 99 mg/dL 62  876  99   BUN 8 - 27 mg/dL 17  30  19    Creatinine 0.76 - 1.27 mg/dL 9.03  8.89  8.93   BUN/Creat Ratio 10 - 24 18   18    Sodium 134 - 144 mmol/L 144  136  144   Potassium 3.5 - 5.2 mmol/L 4.2  4.3  4.3   Chloride 96 - 106 mmol/L 108  104  109   CO2 20 - 29 mmol/L 23  23  22    Calcium  8.6 - 10.2 mg/dL 9.0  8.8  8.9     Lab Results  Component Value Date   CHOL 163 07/27/2022   HDL 75 07/27/2022   LDLCALC 76 07/27/2022   TRIG 59 07/27/2022   CHOLHDL 2.2 07/27/2022    Medications Reviewed Today     Reviewed by Brinda Lorain SQUIBB, RPH (Pharmacist) on 12/03/23 at 1100  Med List Status: <None>   Medication Order Taking? Sig Documenting Provider Last Dose Status Informant  amLODipine  (NORVASC ) 5 MG tablet [id number]  Take 1 tablet (5 mg total) by mouth daily. Oley Todd RAMAN, NP  Active   aspirin  EC 81 MG tablet [id number]  Take 1 tablet (81 mg total) by mouth daily. Swallow whole. Oley Todd RAMAN, NP  Active   atorvastatin  (LIPITOR) 40 MG tablet [id number]  Take 1 tablet (40 mg total) by mouth daily. Oley Todd RAMAN, NP  Active   benazepril  (LOTENSIN ) 20 MG tablet [id number]  Take 2 tablets (40 mg total) by mouth daily. Oley Todd RAMAN, NP  Active     Discontinued 12/03/23 1007 (Change in therapy)   glipiZIDE  (GLUCOTROL ) 5 MG tablet [id number]  Take 1 tablet (5 mg total) by mouth daily before  breakfast. Nichols, Tonya S, NP  Active   isosorbide  mononitrate (IMDUR ) 30 MG 24 hr tablet [id number]  Take 1 tablet (30 mg total) by mouth daily. Oley Todd RAMAN, NP  Active   meclizine  (ANTIVERT ) 12.5 MG tablet [id number]  Take 1 tablet (12.5 mg total) by mouth 3 (three) times daily. Garrick Charleston, MD  Active   metFORMIN  (GLUCOPHAGE ) 500 MG tablet [id number]  Take 2 tablets (1,000 mg total) by mouth 2 (two) times daily with a meal. Oley Todd RAMAN, NP  Active   metoprolol  tartrate (LOPRESSOR ) 25 MG tablet [id number]  Take 1 tablet (25 mg total) by mouth 2 (two) times daily. Oley Todd RAMAN, NP  Active   Multiple Vitamins-Minerals (MULTIVITAMIN WITH MINERALS) tablet [id number]  Take 1 tablet by mouth daily.  Patient not taking: Reported on 05/10/2023   [provider]  Active Self  naproxen sodium (ALEVE) 220 MG tablet [id number]  Take 220-440 mg by mouth 2 (two) times daily as needed (pain). [provider]  Active Self  nitroGLYCERIN  (NITROSTAT ) 0.4 MG SL tablet [id number]  Place 1 tablet (0.4 mg total) under the tongue every 5 (five) minutes as needed for chest pain.  Patient not taking: Reported on 05/10/2023   Sebastian Toribio GAILS, MD  Active   ondansetron  (ZOFRAN ) 4 MG tablet [id number]  Take 1 tablet (4 mg total) by mouth every 6 (six) hours as needed for nausea. Sebastian Toribio GAILS, MD  Active               Assessment/Plan:   Diabetes: - Currently uncontrolled with most recent A1C of 7.0% above goal <7%, but very close to goal despite inconsistent medication adherence. Will collaborate with PCP to reorder medications to [facility] pharmacy for lower cost and improved adherence. Pending BG at follow-up, may be able to discontinue glipizide  - which is not an ideal medication for patient given age > 60. Farxiga  PAP has been attempted in the past, but enrollment forms were not completed. Can revisit at follow-up.  - Last UACR 07/27/22: 8 mg/g - Reviewed long term cardiovascular  and renal outcomes of uncontrolled blood sugar - Reviewed goal A1c, goal fasting, and goal 2 hour post prandial glucose - Reviewed hypoglycemia management plan and the rule of 15 - Reviewed dietary modifications including  utilizing the healthy plate method, limiting portion size of carbohydrate foods, increasing intake of protein and non-starchy vegetables. Counseled patient to stay hydrated with water  throughout the day. - Reviewed lifestyle modifications including: aiming for 150 minutes of moderate intensity exercise every week.  - Recommend to continue metformin  1000 mg BID - Recommend to continue glipizide  5 mg with breakfast  - Recommend to check glucose twice daily: fasting and 2-hr PPG. Counseled patient to bring glucometer or BG log to every appointment. - Next A1C due October 2025      Hypertension: - Currently uncontrolled with clinic BP  above goal less than 130/80. Patient is not having s/sx of hypo- or hyper-tension. Medication adherence appears inappropriate due to issues with cost. Will transition patient to [facility] pharmacy as many of his medications are available on [facility] list, as described above. - Reviewed long term cardiovascular and renal outcomes of uncontrolled blood pressure - Reviewed appropriate blood pressure monitoring technique and reviewed goal blood pressure. Recommended to check home blood pressure and heart rate  once daily and keep a log to bring to upcoming appointments - Recommend to continue amlodipine  5 mg daily, benazepril  40 mg daily, isosorbide  mononitrate 30 mg daily, metoprolol  tartrate 25 mg BID - Recommend repeat BMP at PCP f/u to monitor SCR and potassium    Hyperlipidemia/ASCVD Risk Reduction: - Currently uncontrolled with most recent LDL-C of 76 mg/dL above goal < 70 mg/dL given ASCVD. High intensity statin indicated. Will transition patient to [facility] for improved adherence. - Reviewed long term complications of uncontrolled cholesterol -  Recommend to continue atorvastatin  40 mg daily    Written patient instructions provided. Patient verbalized understanding of treatment plan.    Follow Up Plan:  Pharmacist 02/04/24 PCP clinic visit 01/17/24   Lorain Baseman, PharmD [facility] [phone]

## 2024-01-17 ENCOUNTER — Encounter: Payer: Self-pay | Admitting: Nurse Practitioner

## 2024-01-17 ENCOUNTER — Ambulatory Visit (INDEPENDENT_AMBULATORY_CARE_PROVIDER_SITE_OTHER): Payer: Self-pay | Admitting: Nurse Practitioner

## 2024-01-17 VITALS — BP 127/75 | HR 72 | Temp 98.4°F | Wt 152.4 lb

## 2024-01-17 DIAGNOSIS — E1100 Type 2 diabetes mellitus with hyperosmolarity without nonketotic hyperglycemic-hyperosmolar coma (NKHHC): Secondary | ICD-10-CM

## 2024-01-17 DIAGNOSIS — Z13220 Encounter for screening for lipoid disorders: Secondary | ICD-10-CM

## 2024-01-17 LAB — POCT GLYCOSYLATED HEMOGLOBIN (HGB A1C): Hemoglobin A1C: 8 % — AB (ref 4.0–5.6)

## 2024-01-17 NOTE — Progress Notes (Signed)
 Subjective   Patient ID: Todd Gonzalez, male    DOB: [birth year], 68 y.o.   MRN: [id number]  Chief Complaint  Patient presents with   Medical Management of Chronic Issues   Diabetes    Referring provider: Oley Bascom RAMAN, NP  Todd Gonzalez is a 68 y.o. male with Past Medical History: No date: CAD (coronary artery disease)     Comment:  ruptured plaque in OM 2003, NSTEMI/CABG 01/2020 No date: Headache(784.0) No date: HLD (hyperlipidemia) No date: HTN (hypertension) No date: Mild carotid artery disease (HCC) No date: Pre-diabetes No date: RBBB (right bundle branch block)  HPI  Diabetes:    Patient presents today for a diabetes follow up. A1C today was 8.0. Patient has been out of medications due to cost. Will get these refilled today. Will continue medication at current dosage. Denies f/c/s, n/v/d, hemoptysis, PND, leg swelling Denies chest pain or edema     Hypertension:   Patient presents for follow-up on hypertension.  Patient is currently on isosorbide , benazepril , amlodipine .  Patient is also on atorvastatin  for cholesterol but does not have this in his bag with his other medications today.  We will send in a refill for this today along with his other medications.  Blood pressure stable in office today. Denies f/c/s, n/v/d, hemoptysis, PND, leg swelling Denies chest pain or edema    No Known Allergies  Immunization History  Administered Date(s) Administered   Fluad Quad(high Dose 65+) 02/01/2023    Tobacco History: Social History   Tobacco Use  Smoking Status Some Days   Current packs/day: 1.00   Types: Cigarettes  Smokeless Tobacco Never  Tobacco Comments   1 pack every 4 days   Ready to quit: Not Answered Counseling given: Yes Tobacco comments: 1 pack every 4 days   Outpatient Encounter Medications as of 01/17/2024  Medication Sig   amLODipine  (NORVASC ) 5 MG tablet Take 1 tablet (5 mg total) by mouth daily.   aspirin  EC 81 MG tablet Take 1  tablet (81 mg total) by mouth daily. Swallow whole.   atorvastatin  (LIPITOR) 40 MG tablet Take 1 tablet (40 mg total) by mouth daily.   benazepril  (LOTENSIN ) 20 MG tablet Take 2 tablets (40 mg total) by mouth daily.   glipiZIDE  (GLUCOTROL ) 5 MG tablet Take 1 tablet (5 mg total) by mouth daily before breakfast.   isosorbide  mononitrate (IMDUR ) 30 MG 24 hr tablet Take 1 tablet (30 mg total) by mouth daily.   meclizine  (ANTIVERT ) 12.5 MG tablet Take 1 tablet (12.5 mg total) by mouth 3 (three) times daily.   metFORMIN  (GLUCOPHAGE ) 500 MG tablet Take 2 tablets (1,000 mg total) by mouth 2 (two) times daily with a meal.   metoprolol  tartrate (LOPRESSOR ) 25 MG tablet Take 1 tablet (25 mg total) by mouth 2 (two) times daily.   naproxen sodium (ALEVE) 220 MG tablet Take 220-440 mg by mouth 2 (two) times daily as needed (pain).   ondansetron  (ZOFRAN ) 4 MG tablet Take 1 tablet (4 mg total) by mouth every 6 (six) hours as needed for nausea.   Multiple Vitamins-Minerals (MULTIVITAMIN WITH MINERALS) tablet Take 1 tablet by mouth daily. (Patient not taking: Reported on 05/10/2023)   nitroGLYCERIN  (NITROSTAT ) 0.4 MG SL tablet Place 1 tablet (0.4 mg total) under the tongue every 5 (five) minutes as needed for chest pain. (Patient not taking: Reported on 05/10/2023)   No facility-administered encounter medications on file as of 01/17/2024.    Review of Systems  Review  of Systems  Constitutional: Negative.   HENT: Negative.    Cardiovascular: Negative.   Gastrointestinal: Negative.   Allergic/Immunologic: Negative.   Neurological: Negative.   Psychiatric/Behavioral: Negative.       Objective:   BP 127/75   Pulse 72   Temp 98.4 F (36.9 C) (Oral)   Wt 152 lb 6.4 oz (69.1 kg)   SpO2 98%   BMI 24.60 kg/m   Wt Readings from Last 5 Encounters:  01/17/24 152 lb 6.4 oz (69.1 kg)  05/10/23 159 lb 3.2 oz (72.2 kg)  02/01/23 152 lb (68.9 kg)  12/20/22 178 lb (80.7 kg)  11/02/22 153 lb 3.2 oz (69.5 kg)      Physical Exam Vitals and nursing note reviewed.  Constitutional:      General: He is not in acute distress.    Appearance: He is well-developed.  Cardiovascular:     Rate and Rhythm: Normal rate and regular rhythm.  Pulmonary:     Effort: Pulmonary effort is normal.     Breath sounds: Normal breath sounds.  Skin:    General: Skin is warm and dry.  Neurological:     Mental Status: He is alert and oriented to person, place, and time.       Assessment & Plan:   Type 2 diabetes mellitus with hyperosmolar hyperglycemic state (HHS) (HCC) -     POCT glycosylated hemoglobin (Hb A1C) -     AMB Referral [facility] Care Management -     CBC -     Comprehensive metabolic panel with GFR  Lipid screening -     Lipid panel     Return in about 3 months (around 04/18/2024).     Bascom GORMAN Borer, NP 01/17/2024

## 2024-01-18 LAB — CBC
Hematocrit: 38.4 % (ref 37.5–51.0)
Hemoglobin: 12.5 g/dL — ABNORMAL LOW (ref 13.0–17.7)
MCH: 28.5 pg (ref 26.6–33.0)
MCHC: 32.6 g/dL (ref 31.5–35.7)
MCV: 88 fL (ref 79–97)
Platelets: 324 x10E3/uL (ref 150–450)
RBC: 4.39 x10E6/uL (ref 4.14–5.80)
RDW: 15.1 % (ref 11.6–15.4)
WBC: 7.4 x10E3/uL (ref 3.4–10.8)

## 2024-01-18 LAB — COMPREHENSIVE METABOLIC PANEL WITH GFR
ALT: 21 IU/L (ref 0–44)
AST: 17 IU/L (ref 0–40)
Albumin: 3.9 g/dL (ref 3.9–4.9)
Alkaline Phosphatase: 59 IU/L (ref 44–121)
BUN/Creatinine Ratio: 24 (ref 10–24)
BUN: 30 mg/dL — ABNORMAL HIGH (ref 8–27)
Bilirubin Total: <0.2 mg/dL (ref 0.0–1.2)
CO2: 20 mmol/L (ref 20–29)
Calcium: 9.2 mg/dL (ref 8.6–10.2)
Chloride: 102 mmol/L (ref 96–106)
Creatinine, Ser: 1.27 mg/dL (ref 0.76–1.27)
Globulin, Total: 2.9 g/dL (ref 1.5–4.5)
Glucose: 134 mg/dL — ABNORMAL HIGH (ref 70–99)
Potassium: 6.1 mmol/L — ABNORMAL HIGH (ref 3.5–5.2)
Sodium: 136 mmol/L (ref 134–144)
Total Protein: 6.8 g/dL (ref 6.0–8.5)
eGFR: 61 mL/min/1.73 (ref >59)

## 2024-01-18 LAB — LIPID PANEL
Chol/HDL Ratio: 3 ratio (ref 0.0–5.0)
Cholesterol, Total: 196 mg/dL (ref 100–199)
HDL: 66 mg/dL (ref >39)
LDL Chol Calc (NIH): 108 mg/dL — ABNORMAL HIGH (ref 0–99)
Triglycerides: 124 mg/dL (ref 0–149)
VLDL Cholesterol Cal: 22 mg/dL (ref 5–40)

## 2024-01-18 LAB — MICROALBUMIN / CREATININE URINE RATIO
Creatinine, Urine: 204.2 mg/dL
Microalb/Creat Ratio: 3 mg/g{creat} (ref 0–29)
Microalbumin, Urine: 5.6 ug/mL

## 2024-01-23 ENCOUNTER — Ambulatory Visit: Payer: Self-pay | Admitting: Nurse Practitioner

## 2024-02-04 ENCOUNTER — Other Ambulatory Visit: Payer: Self-pay

## 2024-02-04 ENCOUNTER — Other Ambulatory Visit (HOSPITAL_COMMUNITY): Payer: Self-pay

## 2024-02-04 ENCOUNTER — Ambulatory Visit (INDEPENDENT_AMBULATORY_CARE_PROVIDER_SITE_OTHER): Payer: Self-pay

## 2024-02-04 VITALS — BP 129/67 | HR 83

## 2024-02-04 DIAGNOSIS — I214 Non-ST elevation (NSTEMI) myocardial infarction: Secondary | ICD-10-CM

## 2024-02-04 DIAGNOSIS — I2510 Atherosclerotic heart disease of native coronary artery without angina pectoris: Secondary | ICD-10-CM

## 2024-02-04 DIAGNOSIS — E1100 Type 2 diabetes mellitus with hyperosmolarity without nonketotic hyperglycemic-hyperosmolar coma (NKHHC): Secondary | ICD-10-CM

## 2024-02-04 MED ORDER — METFORMIN HCL 1000 MG PO TABS
1000.0000 mg | ORAL_TABLET | Freq: Two times a day (BID) | ORAL | 11 refills | Status: AC
  Filled 2024-02-04 – 2024-02-12 (×2): qty 60, 30d supply, fill #0
  Filled 2024-03-20: qty 60, 30d supply, fill #1
  Filled 2024-05-29: qty 60, 30d supply, fill #2

## 2024-02-04 MED ORDER — METOPROLOL SUCCINATE ER 25 MG PO TB24
25.0000 mg | ORAL_TABLET | Freq: Two times a day (BID) | ORAL | 11 refills | Status: AC
  Filled 2024-02-04: qty 60, 30d supply, fill #0
  Filled 2024-04-21: qty 60, 30d supply, fill #1
  Filled 2024-05-29: qty 60, 30d supply, fill #2

## 2024-02-04 NOTE — Patient Instructions (Addendum)
 It was nice to see you today!  Your goal blood sugar is 80-130 before eating and less than 180 after eating.  Medication Changes: HOLD Benazepril  (blood pressure medication) until we can recheck your labs at your next visit with me.   Continue amlodipine  5 mg daily   STOP glipizide . I took this bottle from you today.   Continue metformin  1000 mg (2 tablets) twice daily with meals  We are going to work on an application for Farxiga  - which is another medication for your blood sugars, since you cannot tolerate glipizide . This medicine also helps protect your kidneys.  Continue all other medication the same: metoprolol  25 mg twice daily, atorvastatin  40 mg daily  Monitor blood sugars at home and keep a log (glucometer or piece of paper) to bring with you to your next visit.  Keep up the good work with diet and exercise. Aim for a diet full of vegetables, fruit and lean meats (chicken, [place], fish). Try to limit salt intake by eating fresh or frozen vegetables (instead of canned), rinse canned vegetables prior to cooking and do not add any additional salt to meals.

## 2024-02-04 NOTE — Progress Notes (Signed)
 02/04/2024 Name: JOON POHLE MRN: [id number] DOB: [birth year]  Chief Complaint  Patient presents with   Hypertension   Diabetes   Hyperlipidemia    KENNAN DETTER is a 68 y.o. year old male who presented for a face-to-face visit.    They were referred to the pharmacist by their PCP for assistance in managing diabetes.  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 At last visit, A1C had improved from 8.1% to 6.8%. BP was 129/64 mmHg. He had been contacted by pharmacy several times over the past few months to follow-up on suspected adherence issues. He was seen in person by pharmacy on 12/03/23 and reported he had difficulty affording his medications. He was transitioned to [facility] pharmacy for affordability. He saw PCP on 01/17/24. A1C had increased to 8.0% in the setting of medication nonadherence. He was encouraged to refill his medications at [facility]. BP was controlled.   Today, patient reports doing ok. He continues to experience financial troubles and has not been able to pick up his medications.He reports he has not been taking glipizide  because it makes him drowsy and feel bad. He is out of metformin , isosorbide , and metoprolol  today. He still has some supplies remaining of amlodipine , benazepril , aspirin , and atorvastatin .    Care Team: Primary Care Provider: Borer Bascom RAMAN, NP ; Next Scheduled Visit: 04/24/24  Medication Access/Adherence  Current Pharmacy:  [facility] - [facility] [address] [place] [address] Phone: [phone] Fax: [phone]  [facility] - [place], [place] - [address] [place] [address] Phone: [phone] Fax: [phone]   Patient reports affordability concerns with their medications: Yes  - no insurance  Patient reports access/transportation concerns to their  pharmacy: Yes  - uses the bus system, but reports he would be able to get to [facility]  Patient reports adherence concerns with their medications:  Yes  - due to cost. Out of metformin , isosorbide , and metoprolol  today.  Diabetes:  Current medications: glipizide  XL 5 mg daily with breakfast (not taking due to side effects - Drowsy), metformin  IR 1000 mg BID with meals (has been out for a couple days)  Current glucose readings:  Using glucometer; testing occasionally: FBG 124 mg/dL, 4 days ago. He does not have his glucometer with him.   Denies s/sx of hypoglycemia.  Patient reports hyperglycemic symptoms including polyuria, polydipsia. Denies polyphagia, nocturia, neuropathy, blurred vision.Higher BG symptoms usually associated with sweet drink.   Current meal patterns: two meals/day (wakes up around 12-1PM). Tries to avoid pure white rice Eats wheat bread.   - Lunch - fufu, soup - Supper - did not discuss in detail.  - Drinks: sweet tea occasionally ,drinks a lot of water  during the day  Current medication access support: DO[facility] at [facility]  Hypertension:  Current medications: amlodipine  5 mg daily, metoprolol  tartrate 25 mg BID (currently out), benazepril  40 mg daily, isosorbide  mononitrate 30 mg daily (currently out)  Smokes about 2-3 cigarettes per day - he reports that he is trying to cut back but is not ready to quit at this time  Patient has a validated, automated, upper arm home BP  cuff - reports that batter is low and he has not checked recently. Current blood pressure readings readings: n/a  Patient denies hypotensive s/sx including dizziness, lightheadedness.  Patient denies hypertensive symptoms including headache, chest pain, shortness of breath   Hyperlipidemia/ASCVD Risk Reduction  Current lipid lowering medications: atorvastatin  40 mg daily  Antiplatelet regimen: ASA 81 mg daily  ASCVD History: CABG, NSTEMI, unstable angina Risk Factors: tobacco use, ASCVD,  T2DM  Clinical ASCVD: Yes  The ASCVD Risk score (Arnett DK, et al., 2019) failed to calculate for the following reasons:   Risk score cannot be calculated because patient has a medical history suggesting prior/existing ASCVD    Objective:  BP Readings from Last 3 Encounters:  02/04/24 129/67  01/17/24 127/75  12/03/23 (!) 145/76    Lab Results  Component Value Date   HGBA1C 8.0 (A) 01/17/2024   HGBA1C 7.0 12/03/2023   HGBA1C 6.8 (A) 05/10/2023       Latest Ref Rng & Units 01/17/2024    9:34 AM 05/10/2023   10:19 AM 12/20/2022    3:07 PM  BMP  Glucose 70 - 99 mg/dL 865  62  876   BUN 8 - 27 mg/dL 30  17  30    Creatinine 0.76 - 1.27 mg/dL 8.72  9.03  8.89   BUN/Creat Ratio 10 - 24 24  18     Sodium 134 - 144 mmol/L 136  144  136   Potassium 3.5 - 5.2 mmol/L 6.1  4.2  4.3   Chloride 96 - 106 mmol/L 102  108  104   CO2 20 - 29 mmol/L 20  23  23    Calcium  8.6 - 10.2 mg/dL 9.2  9.0  8.8     Lab Results  Component Value Date   CHOL 196 01/17/2024   HDL 66 01/17/2024   LDLCALC 108 (H) 01/17/2024   TRIG 124 01/17/2024   CHOLHDL 3.0 01/17/2024    Medications Reviewed Today     Reviewed by Brinda Lorain SQUIBB, RPH (Pharmacist) on 02/04/24 at 1025  Med List Status: <None>   Medication Order Taking? Sig Documenting Provider Last Dose Status Informant  amLODipine  (NORVASC ) 5 MG tablet [id number] Yes Take 1 tablet (5 mg total) by mouth daily. Oley Bascom RAMAN, NP  Active   aspirin  EC 81 MG tablet [id number] Yes Take 1 tablet (81 mg total) by mouth daily. Swallow whole. Oley Bascom RAMAN, NP  Active   atorvastatin  (LIPITOR) 40 MG tablet [id number] Yes Take 1 tablet (40 mg total) by mouth daily. Oley Bascom RAMAN, NP  Active    Discontinued 02/04/24 1010 (Side effect (s))            Med Note>> Brinda Lorain SQUIBB, [facility]   02/04/2024 10:10 AM hyperkalemia     Patient not taking:   Discontinued 02/04/24 1011 (Side effect (s))   isosorbide  mononitrate (IMDUR ) 30 MG 24 hr tablet [id number]  Take 1  tablet (30 mg total) by mouth daily.  Patient not taking: Reported on 02/04/2024   Oley Bascom RAMAN, NP  Active   meclizine  (ANTIVERT ) 12.5 MG tablet [id number]  Take 1 tablet (12.5 mg total) by mouth 3 (three) times daily.  Patient not taking: Reported on 02/04/2024   Garrick Charleston, MD  Active   metFORMIN  (GLUCOPHAGE ) 500 MG tablet [id number]  Take 2 tablets (1,000 mg total) by mouth 2 (two) times daily with a meal.  Patient not taking: Reported on 02/04/2024   Oley Bascom RAMAN, NP  Active   metoprolol  succinate (TOPROL -XL) 25 MG 24 hr tablet [id number] Yes Take 1 tablet (25 mg total) by mouth in the morning and at bedtime. Oley Bascom RAMAN,  NP  Active    Patient not taking:   Discontinued 02/04/24 1021 (Change in therapy)            Med Note>> Brinda Lorain SQUIBB, [facility]   02/04/2024 10:21 AM Switching to succinate for DOH    Multiple Vitamins-Minerals (MULTIVITAMIN WITH MINERALS) tablet [id number]  Take 1 tablet by mouth daily.  Patient not taking: Reported on 05/10/2023   [provider]  Active Self  naproxen sodium (ALEVE) 220 MG tablet [id number]  Take 220-440 mg by mouth 2 (two) times daily as needed (pain). [provider]  Active Self  nitroGLYCERIN  (NITROSTAT ) 0.4 MG SL tablet [id number]  Place 1 tablet (0.4 mg total) under the tongue every 5 (five) minutes as needed for chest pain.  Patient not taking: Reported on 05/10/2023   Sebastian Toribio GAILS, MD  Active   ondansetron  (ZOFRAN ) 4 MG tablet [id number]  Take 1 tablet (4 mg total) by mouth every 6 (six) hours as needed for nausea. Sebastian Toribio GAILS, MD  Active               Assessment/Plan:   Diabetes: - Currently uncontrolled with most recent A1C of 8.0% above goal <7%, and worsened from 7.0% in the setting of medication non-adherence (cost barrier). Collaborated with [facility] pharmacy today to fill as many medications as possible for free via DOH. Obtained signatures for Farxiga  PAP forms today - so will attempt to  initiate SGLT2i as an alternative antihyperglycemic as he is not able to tolerate glipizide . Patient is not eligible for Medicare, but may have to get L[place] denial to get [place]. - Last UACR 07/27/22: 8 mg/g - Reviewed long term cardiovascular and renal outcomes of uncontrolled blood sugar - Reviewed goal A1c, goal fasting, and goal 2 hour post prandial glucose - Reviewed hypoglycemia management plan and the rule of 15 - Reviewed dietary modifications including  utilizing the healthy plate method, limiting portion size of carbohydrate foods, increasing intake of protein and non-starchy vegetables. Counseled patient to stay hydrated with water  throughout the day. - Reviewed lifestyle modifications including: aiming for 150 minutes of moderate intensity exercise every week.  - Recommend to continue metformin  1000 mg BID - Recommend to stop glipizide  - Will collaborate with CPhT to pursue coverage of Farxiga  via [place]. Will help patient apply for Medicare LIS at follow-up if denial is needed.  - Recommend to check glucose twice daily: fasting and 2-hr PPG. Counseled patient to bring glucometer or BG log to every appointment. - Next A1C due 04/18/24    Hypertension: - Currently controlled with clinic BP  below goal less than 130/80. Patient is not having s/sx of hypo- or hyper-tension. Medication adherence appears inappropriate due to issues with cost - and he has not taken any medications yet this AM. Last BMP demonstrated K elevated to 6.1 mEq/L after restarting benazepril . Will hold ACEi for moderate hyperkalemia and recheck labs in 2 weeks (no lab tech on site today). May not need to restart ACEi if BP remains controlled, or could restart at low dose given hx of MI and DM.  - Reviewed long term cardiovascular and renal outcomes of uncontrolled blood pressure - Reviewed appropriate blood pressure monitoring technique and reviewed goal blood pressure. Recommended to check home blood pressure and  heart rate  once daily and keep a log to bring to upcoming appointments - Recommend to HOLD benazepril  40 mg daily and recheck BMP on 02/19/24 - Recommend to continue amlodipine  5 mg daily,  isosorbide  mononitrate 30 mg daily - Will collaborate with prescriber to switch metoprolol  tartrate 25 mg BID to metoprolol  succinate 25 mg BID for DOH (affordability)    Hyperlipidemia/ASCVD Risk Reduction: - Currently uncontrolled with most recent LDL-C of 108 mg/dL above goal < 70 mg/dL given ASCVD. High intensity statin indicated. Encouraged patient to take this medication daily, as he can obtain for free at [facility] pharmacy.  - Reviewed long term complications of uncontrolled cholesterol - Recommend to continue atorvastatin  40 mg daily    Written patient instructions provided. Patient verbalized understanding of treatment plan.    Follow Up Plan:  Pharmacist in person 02/19/24 PCP clinic visit 04/24/24   Lorain Baseman, PharmD [facility] [phone]

## 2024-02-07 ENCOUNTER — Other Ambulatory Visit: Payer: Self-pay

## 2024-02-12 ENCOUNTER — Other Ambulatory Visit: Payer: Self-pay

## 2024-02-14 ENCOUNTER — Other Ambulatory Visit: Payer: Self-pay

## 2024-02-14 ENCOUNTER — Telehealth: Payer: Self-pay

## 2024-02-14 NOTE — Telephone Encounter (Signed)
 Submitted application for FARXIGA to [place] for patient assistance.   Phone: [phone]

## 2024-02-14 NOTE — Telephone Encounter (Signed)
 Anticipate patient will be denied due to low income. Will assist in submitting application for Medicare LIS next week, so that we can send denial letter to [place], as there is previous documentation in pt chart that he does not qualify for Medicare.   Lorain Baseman, PharmD [facility] [phone]

## 2024-02-18 ENCOUNTER — Other Ambulatory Visit: Payer: Self-pay

## 2024-02-19 ENCOUNTER — Other Ambulatory Visit: Payer: Self-pay

## 2024-02-19 ENCOUNTER — Telehealth: Payer: Self-pay

## 2024-02-19 ENCOUNTER — Ambulatory Visit (INDEPENDENT_AMBULATORY_CARE_PROVIDER_SITE_OTHER): Payer: Self-pay

## 2024-02-19 VITALS — BP 127/66 | HR 76

## 2024-02-19 DIAGNOSIS — I10 Essential (primary) hypertension: Secondary | ICD-10-CM

## 2024-02-19 NOTE — Patient Instructions (Signed)
 It was nice to see you today!  Your goal blood sugar is 80-130 before eating and less than 180 after eating.  Medication Changes:  Continue all medication the same. Continue to hold benazepril  for blood pressure. I will contact you if your labs are abnormal  Please look out for a letter from Social Security or Medicare Extra Help in the mail. Hold onto this and bring to our next appointment  Please call me if you have trouble getting your medications before we talk again.   Monitor blood sugars at home and keep a log (glucometer or piece of paper) to bring with you to your next visit.  Keep up the good work with diet and exercise. Aim for a diet full of vegetables, fruit and lean meats (chicken, [place], fish). Try to limit salt intake by eating fresh or frozen vegetables (instead of canned), rinse canned vegetables prior to cooking and do not add any additional salt to meals.

## 2024-02-19 NOTE — Progress Notes (Signed)
 02/19/2024 Name: Todd Gonzalez MRN: [id number] DOB: Dec 18, 1955  Chief Complaint  Patient presents with   Diabetes   Hypertension    Todd Gonzalez is a 68 y.o. year old male who presented for a face-to-face visit.    They were referred to the pharmacist by their PCP for assistance in managing diabetes.  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 At last visit, A1C had improved from 8.1% to 6.8%. BP was 129/64 mmHg. He had been contacted by pharmacy several times over the past few months to follow-up on suspected adherence issues. He was seen in person by pharmacy on 12/03/23 and reported he had difficulty affording his medications. He was transitioned to [facility] pharmacy for affordability. He saw PCP on 01/17/24. A1C had increased to 8.0% in the setting of medication nonadherence. He was encouraged to refill his medications at [facility]. BP was controlled. At pharmacy visit on 02/04/24, patient was still reporting financial barriers to filling his medications. He was instructed to stop glipizide  due to side effects. Collaborated with [facility] pharmacy to fill metformin , isosorbide , and metoprolol . I had him sign paper work for Farxiga  PAP. He was also instructed to hold benazepril  for K of 6.1 on 10/17/23. He was instructed to return for repeat labs in ~2 weeks.  Today, patient reports doing ok. He continues to experience financial troubles, but was able to pick up his medications. He is agreeable to applying for LIS today, though reports he has been denied from Medicaid/Medicare in the past due to not being a citizen. Explained that we may need LIS denial letter for get approval to receive his medications via [place].   Care Team: Primary Care Provider: Borer Bascom RAMAN, NP ; Next Scheduled Visit: 04/24/24  Medication Access/Adherence  Current Pharmacy:  [facility] - [facility]  [facility]acy [address] [place] [address] Phone: [phone] Fax: [phone]  [facility] - [place], [place] - [address] [place] [address] Phone: [phone] Fax: [phone]   Patient reports affordability concerns with their medications: Yes  - no insurance  Patient reports access/transportation concerns to their pharmacy: Yes  - uses the bus system, but reports he is able to get to [facility]  Patient reports adherence concerns with their medications:  Yes  - due to cost. Currently has appropriate medications Diabetes:  Current medications: metformin  IR 1000 mg BID with meals   Current glucose readings:  Using glucometer; testing occasionally: did not review glucometer today  Denies s/sx of hypoglycemia.  Patient reports hyperglycemic symptoms including polyuria, polydipsia. Denies polyphagia, nocturia, neuropathy, blurred vision.Higher BG symptoms usually associated with sweet drink.   Current meal patterns: two meals/day (wakes up around 12-1PM). Tries to avoid pure white rice Eats wheat bread.   - Lunch - fufu, soup - Supper - did not discuss in detail.  - Drinks: sweet tea occasionally, drinks a lot of water  during the day  Current medication access support: DOH at [facility]  Hypertension:  Current medications: amlodipine  5 mg daily, metoprolol  succinate 25 mg BID, isosorbide  mononitrate 30 mg daily  Smokes about 2-3 cigarettes per day - he reports that he is trying to cut back but is not ready to quit at this time  Patient has a validated, automated, upper arm home BP  cuff - reports that  batter is low and he has not checked recently. Current blood pressure readings readings: n/a  Patient denies hypotensive s/sx including dizziness, lightheadedness.  Patient denies hypertensive symptoms including headache, chest pain, shortness of breath   Hyperlipidemia/ASCVD Risk Reduction  Current lipid lowering medications:  atorvastatin  40 mg daily  Antiplatelet regimen: ASA 81 mg daily  ASCVD History: CABG, NSTEMI, unstable angina Risk Factors: tobacco use, ASCVD, T2DM  Clinical ASCVD: Yes  The ASCVD Risk score (Arnett DK, et al., 2019) failed to calculate for the following reasons:   Risk score cannot be calculated because patient has a medical history suggesting prior/existing ASCVD    Objective:  BP Readings from Last 3 Encounters:  02/19/24 127/66  02/04/24 129/67  01/17/24 127/75    Lab Results  Component Value Date   HGBA1C 8.0 (A) 01/17/2024   HGBA1C 7.0 12/03/2023   HGBA1C 6.8 (A) 05/10/2023       Latest Ref Rng & Units 01/17/2024    9:34 AM 05/10/2023   10:19 AM 12/20/2022    3:07 PM  BMP  Glucose 70 - 99 mg/dL 865  62  876   BUN 8 - 27 mg/dL 30  17  30    Creatinine 0.76 - 1.27 mg/dL 8.72  9.03  8.89   BUN/Creat Ratio 10 - 24 24  18     Sodium 134 - 144 mmol/L 136  144  136   Potassium 3.5 - 5.2 mmol/L 6.1  4.2  4.3   Chloride 96 - 106 mmol/L 102  108  104   CO2 20 - 29 mmol/L 20  23  23    Calcium  8.6 - 10.2 mg/dL 9.2  9.0  8.8     Lab Results  Component Value Date   CHOL 196 01/17/2024   HDL 66 01/17/2024   LDLCALC 108 (H) 01/17/2024   TRIG 124 01/17/2024   CHOLHDL 3.0 01/17/2024    Medications Reviewed Today     Reviewed by Brinda Lorain SQUIBB, RPH (Pharmacist) on 02/19/24 at 1433  Med List Status: <None>   Medication Order Taking? Sig Documenting Provider Last Dose Status Informant  amLODipine  (NORVASC ) 5 MG tablet [id number] Yes Take 1 tablet (5 mg total) by mouth daily. Oley Bascom RAMAN, NP  Active   aspirin  EC 81 MG tablet [id number] Yes Take 1 tablet (81 mg total) by mouth daily. Swallow whole. Oley Bascom RAMAN, NP  Active   atorvastatin  (LIPITOR) 40 MG tablet [id number] Yes Take 1 tablet (40 mg total) by mouth daily. Oley Bascom RAMAN, NP  Active   isosorbide  mononitrate (IMDUR ) 30 MG 24 hr tablet [id number] Yes Take 1 tablet (30 mg total) by mouth daily. Oley Bascom RAMAN,  NP  Active   meclizine  (ANTIVERT ) 12.5 MG tablet [id number]  Take 1 tablet (12.5 mg total) by mouth 3 (three) times daily.  Patient not taking: Reported on 02/04/2024   Garrick Charleston, MD  Active   metFORMIN  (GLUCOPHAGE ) 1000 MG tablet [id number] Yes Take 1 tablet (1,000 mg total) by mouth 2 (two) times daily with a meal. Oley Bascom RAMAN, NP  Active   metoprolol  succinate (TOPROL -XL) 25 MG 24 hr tablet [id number] Yes Take 1 tablet (25 mg total) by mouth in the morning and at bedtime. Oley Bascom RAMAN, NP  Active   Multiple Vitamins-Minerals (MULTIVITAMIN WITH MINERALS) tablet [id number]  Take 1 tablet by mouth daily.  Patient not taking: Reported on 05/10/2023   [provider]  Active Self  naproxen sodium (ALEVE) 220 MG tablet [id number]  Take 220-440 mg by mouth 2 (two) times daily as needed (pain). [provider]  Active Self  nitroGLYCERIN  (NITROSTAT ) 0.4 MG SL tablet [id number]  Place 1 tablet (0.4 mg total) under the tongue every 5 (five) minutes as needed for chest pain.  Patient not taking: Reported on 05/10/2023   Sebastian Toribio GAILS, MD  Active   ondansetron  (ZOFRAN ) 4 MG tablet [id number]  Take 1 tablet (4 mg total) by mouth every 6 (six) hours as needed for nausea. Sebastian Toribio GAILS, MD  Active               Assessment/Plan:   Diabetes: - Currently uncontrolled with most recent A1C of 8.0% above goal <7%, and worsened from 7.0% in the setting of medication non-adherence (cost barrier). Assisted in applying for Medicare LIS today in case denial letter is needed to pursue PAP for Farxiga  via [place]. Encouraged patient to take metformin  as prescribed for glycemic control.  - Last UACR 07/27/22: 8 mg/g - Reviewed long term cardiovascular and renal outcomes of uncontrolled blood sugar - Reviewed goal A1c, goal fasting, and goal 2 hour post prandial glucose - Reviewed hypoglycemia management plan and the rule of 15 - Reviewed dietary modifications including  utilizing  the healthy plate method, limiting portion size of carbohydrate foods, increasing intake of protein and non-starchy vegetables. Counseled patient to stay hydrated with water  throughout the day. - Reviewed lifestyle modifications including: aiming for 150 minutes of moderate intensity exercise every week.  - Recommend to continue metformin  1000 mg BID - Will collaborate with CPhT to follow-up coverage of Farxiga  (or Xigduo XR) via [place]. Instructed patient to save any communication he receives from [employer] LIS/Extra Help/Social Security office.  - Recommend to check glucose twice daily: fasting and 2-hr PPG. Counseled patient to bring glucometer or BG log to every appointment. - Next A1C due 04/18/24    Hypertension: - Currently controlled with clinic BP  below goal less than 130/80. Patient is not having s/sx of hypo- or hyper-tension. Medication adherence is currently appropriate. Last BMP demonstrated K elevated to 6.1 mEq/L after restarting benazepril , patient has been holding ACEi since 02/04/24. Will recheck BMP today, and determine whether he should restart at a lower dose given hx of MI and DM.  - Reviewed long term cardiovascular and renal outcomes of uncontrolled blood pressure - Reviewed appropriate blood pressure monitoring technique and reviewed goal blood pressure. Recommended to check home blood pressure and heart rate  once daily and keep a log to bring to upcoming appointments - Obtained BMP to monitor K, Scr today - Recommend to continue amlodipine  5 mg daily, isosorbide  mononitrate 30 mg daily, metoprolol  succinate 25 mg BID    Hyperlipidemia/ASCVD Risk Reduction: - Currently uncontrolled with most recent LDL-C of 108 mg/dL above goal < 70 mg/dL given ASCVD. Worsened from 76 mg/dL one year ago due to recent nonadherence. High intensity statin indicated. Encouraged patient to take this medication daily, as he can obtain for free at [facility] pharmacy. Given hx of ASCVD, could consider  pursuing [employer] patient assistance in the future.  - Reviewed long term complications of uncontrolled cholesterol - Recommend to continue atorvastatin  40 mg daily    Written patient instructions provided. Patient verbalized understanding of treatment plan.    Follow Up Plan:  Pharmacist in person 04/07/24 PCP clinic visit 04/24/24   Lorain Baseman, PharmD [facility] [phone]

## 2024-02-19 NOTE — Telephone Encounter (Signed)
 Received notification from [place] regarding approval for FARXIGA . Patient assistance approved from 02/18/2024 to 02/17/2025.  Medication will ship to [address], [address]  Pt ID: [id number]  Company phone: [phone]

## 2024-02-20 ENCOUNTER — Ambulatory Visit: Payer: Self-pay

## 2024-02-20 ENCOUNTER — Other Ambulatory Visit: Payer: Self-pay

## 2024-02-20 LAB — BASIC METABOLIC PANEL WITH GFR
BUN/Creatinine Ratio: 17 (ref 10–24)
BUN: 18 mg/dL (ref 8–27)
CO2: 24 mmol/L (ref 20–29)
Calcium: 9.3 mg/dL (ref 8.6–10.2)
Chloride: 103 mmol/L (ref 96–106)
Creatinine, Ser: 1.09 mg/dL (ref 0.76–1.27)
Glucose: 125 mg/dL — ABNORMAL HIGH (ref 70–99)
Potassium: 4.5 mmol/L (ref 3.5–5.2)
Sodium: 141 mmol/L (ref 134–144)
eGFR: 73 mL/min/1.73 (ref >59)

## 2024-02-24 ENCOUNTER — Other Ambulatory Visit: Payer: Self-pay

## 2024-03-20 ENCOUNTER — Other Ambulatory Visit: Payer: Self-pay

## 2024-03-24 ENCOUNTER — Other Ambulatory Visit: Payer: Self-pay

## 2024-04-07 ENCOUNTER — Other Ambulatory Visit (HOSPITAL_COMMUNITY): Payer: Self-pay

## 2024-04-07 ENCOUNTER — Other Ambulatory Visit: Payer: Self-pay

## 2024-04-07 ENCOUNTER — Ambulatory Visit (INDEPENDENT_AMBULATORY_CARE_PROVIDER_SITE_OTHER): Payer: Self-pay

## 2024-04-07 VITALS — BP 124/74 | HR 71

## 2024-04-07 DIAGNOSIS — I10 Essential (primary) hypertension: Secondary | ICD-10-CM

## 2024-04-07 DIAGNOSIS — E1100 Type 2 diabetes mellitus with hyperosmolarity without nonketotic hyperglycemic-hyperosmolar coma (NKHHC): Secondary | ICD-10-CM

## 2024-04-07 MED ORDER — DAPAGLIFLOZIN PROPANEDIOL 10 MG PO TABS: 10.0000 mg | ORAL_TABLET | Freq: Every day | ORAL | 3 refills | Status: AC

## 2024-04-07 MED ORDER — AMLODIPINE BESYLATE 5 MG PO TABS
5.0000 mg | ORAL_TABLET | Freq: Every day | ORAL | 1 refills | Status: AC
  Filled 2024-04-07: qty 90, 90d supply, fill #0

## 2024-04-07 NOTE — Progress Notes (Signed)
 04/07/2024 Name: Todd Gonzalez MRN: [id number] DOB: 1955/06/23  Chief Complaint  Patient presents with   Hypertension   Diabetes   Hyperlipidemia    Todd Gonzalez is a 68 y.o. year old male who presented for a face-to-face visit.    They were referred to the pharmacist by their PCP for assistance in managing diabetes.  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 At last visit, A1C had improved from 8.1% to 6.8%. BP was 129/64 mmHg. He had been contacted by pharmacy several times over the past few months to follow-up on suspected adherence issues. He was seen in person by pharmacy on 12/03/23 and reported he had difficulty affording his medications. He was transitioned to [facility] pharmacy for affordability. He saw PCP on 01/17/24. A1C had increased to 8.0% in the setting of medication nonadherence. He was encouraged to refill his medications at [facility]. BP was controlled. At pharmacy visit on 02/04/24, patient was still reporting financial barriers to filling his medications. He was instructed to stop glipizide  due to side effects. Collaborated with [facility] pharmacy to fill metformin , isosorbide , and metoprolol . I had him sign paper work for Farxiga  PAP. He was also instructed to hold benazepril  for K of 6.1 on 10/17/23. He was instructed to return for repeat labs in ~2 weeks. At appt on 05/22/23, K had normalized off benazepril  and BP remained at goal. We applied for Farxiga  via AZ&Me and Medicare LIS.  Today, patient reports doing ok. He received Farxiga  in the mail and started taking it. Confirms he is staying hydrated with water . He has noticed increased thirst and urination, but otherwise tolerating well. He was rushing this morning and did not take his BP medications yet.   Care Team: Primary Care Provider: Borer Todd RAMAN, NP ; Next Scheduled Visit: 04/24/24  Medication  Access/Adherence  Current Pharmacy:  [facility] [address], [address] [place] [address] Phone: [phone] Fax: [phone]  [facility] - [place], [place] - [address] [place] [address] Phone: [phone] Fax: [phone]   Patient reports affordability concerns with their medications: Yes  - no insurance, using [facility] and [facility]  Patient reports access/transportation concerns to their pharmacy: Yes  - uses the bus system, but reports he is able to get to [facility]  Patient reports adherence concerns with their medications:  Yes  - due to cost. Currently has appropriate medications  Diabetes:  Current medications: metformin  IR 1000 mg BID with meals   Current glucose readings:  Using glucometer; testing occasionally: did not review glucometer today, states he has not checked recently.  Denies s/sx of hypoglycemia.  Patient reports hyperglycemic symptoms including polyuria, polydipsia. Denies polyphagia, nocturia, neuropathy, blurred vision.  Current meal patterns: two meals/day (wakes up around 12-1PM). Tries to avoid pure white rice. Eats wheat bread.   - Lunch - fufu, soup - Supper - did not discuss in detail.  - Drinks: sweet tea occasionally, drinks a lot of water  during the day  Current medication access support: Farxiga  AZ&Me approved through Sept 2026  Hypertension:  Current medications: amlodipine  5 mg daily, metoprolol  succinate 25 mg BID, isosorbide  mononitrate 30 mg daily Previous: benazepril  (hyperkalemia)  Smokes about 2-3 cigarettes per day - he reports that he is trying to cut back but is not ready  to quit at this time  Patient has a validated, automated, upper arm home BP  cuff - recalls SBP of 124 mmHg 3 days ago  Patient denies hypotensive s/sx including dizziness, lightheadedness.  Patient denies hypertensive symptoms including headache, chest pain, shortness of  breath   Hyperlipidemia/ASCVD Risk Reduction  Current lipid lowering medications: atorvastatin  40 mg daily  Antiplatelet regimen: ASA 81 mg daily  ASCVD History: CABG, NSTEMI, unstable angina Risk Factors: tobacco use, ASCVD, T2DM  Clinical ASCVD: Yes  The ASCVD Risk score (Arnett DK, et al., 2019) failed to calculate for the following reasons:   Risk score cannot be calculated because patient has a medical history suggesting prior/existing ASCVD    Objective:  BP Readings from Last 3 Encounters:  04/07/24 124/74  02/19/24 127/66  02/04/24 129/67    Lab Results  Component Value Date   HGBA1C 8.0 (A) 01/17/2024   HGBA1C 7.0 12/03/2023   HGBA1C 6.8 (A) 05/10/2023       Latest Ref Rng & Units 02/19/2024    9:59 AM 01/17/2024    9:34 AM 05/10/2023   10:19 AM  BMP  Glucose 70 - 99 mg/dL 874  865  62   BUN 8 - 27 mg/dL 18  30  17    Creatinine 0.76 - 1.27 mg/dL 8.90  8.72  9.03   BUN/Creat Ratio 10 - 24 17  24  18    Sodium 134 - 144 mmol/L 141  136  144   Potassium 3.5 - 5.2 mmol/L 4.5  6.1  4.2   Chloride 96 - 106 mmol/L 103  102  108   CO2 20 - 29 mmol/L 24  20  23    Calcium  8.6 - 10.2 mg/dL 9.3  9.2  9.0     Lab Results  Component Value Date   CHOL 196 01/17/2024   HDL 66 01/17/2024   LDLCALC 108 (H) 01/17/2024   TRIG 124 01/17/2024   CHOLHDL 3.0 01/17/2024    Medications Reviewed Today     Reviewed by Brinda Lorain SQUIBB, RPH (Pharmacist) on 04/07/24 at 1122  Med List Status: <None>   Medication Order Taking? Sig Documenting Provider Last Dose Status Informant  amLODipine  (NORVASC ) 5 MG tablet [id number] Yes Take 1 tablet (5 mg total) by mouth daily. Todd Todd RAMAN, NP  Active   aspirin  EC 81 MG tablet [id number] Yes Take 1 tablet (81 mg total) by mouth daily. Swallow whole. Todd Todd RAMAN, NP  Active   atorvastatin  (LIPITOR) 40 MG tablet [id number] Yes Take 1 tablet (40 mg total) by mouth daily. Todd Todd RAMAN, NP  Active   dapagliflozin  propanediol (FARXIGA )  10 MG TABS tablet [id number] Yes Take 1 tablet (10 mg total) by mouth daily. Todd Todd RAMAN, NP  Active   isosorbide  mononitrate (IMDUR ) 30 MG 24 hr tablet [id number] Yes Take 1 tablet (30 mg total) by mouth daily. Todd Todd RAMAN, NP  Active   meclizine  (ANTIVERT ) 12.5 MG tablet [id number]  Take 1 tablet (12.5 mg total) by mouth 3 (three) times daily.  Patient not taking: Reported on 02/04/2024   Garrick Charleston, MD  Active   metFORMIN  (GLUCOPHAGE ) 1000 MG tablet [id number] Yes Take 1 tablet (1,000 mg total) by mouth 2 (two) times daily with a meal. Todd Todd RAMAN, NP  Active   metoprolol  succinate (TOPROL -XL) 25 MG 24 hr tablet [id number] Yes Take 1 tablet (25 mg total) by mouth in the morning and at bedtime. Todd Todd RAMAN, NP  Active  Patient not taking:   Discontinued 04/07/24 1122 (Patient Preference)   Discontinued 04/07/24 1122 (Expired Prescription) nitroGLYCERIN  (NITROSTAT ) 0.4 MG SL tablet [id number]  Place 1 tablet (0.4 mg total) under the tongue every 5 (five) minutes as needed for chest pain.  Patient not taking: Reported on 05/10/2023   Sebastian Toribio GAILS, MD  Active     Discontinued 04/07/24 1122 (Expired Prescription)               Assessment/Plan:   Diabetes: - Currently uncontrolled with most recent A1C of 8.0% above goal <7%, and worsened from 7.0% in the setting of medication non-adherence (cost barrier). Patient is now adherent to metformin  and recently started SGLT2i, which he is tolerating well. Appropriate to continue current regimen and recheck A1C in a few weeks. Patient has only been on SGLT2i for 2 weeks, so A1C may not reflect improvement yet. - Last UACR 07/27/22: 8 mg/g - Reviewed long term cardiovascular and renal outcomes of uncontrolled blood sugar - Reviewed goal A1c, goal fasting, and goal 2 hour post prandial glucose - Reviewed dietary modifications including  utilizing the healthy plate method, limiting portion size of carbohydrate foods, increasing  intake of protein and non-starchy vegetables. Counseled patient to stay hydrated with water  throughout the day. - Recommend to continue metformin  1000 mg BID - Recommend to continue Farxiga  10 mg daily. Reinforced SGLT2i counseling points including staying hydrated and sick day rules. Sent refills to [facility]. - Recommend to check glucose twice daily: fasting and 2-hr PPG. Counseled patient to bring glucometer or BG log to every appointment. - Next A1C due 04/18/24    Hypertension: - Currently controlled with clinic BP  below goal less than 130/80. Patient is not having s/sx of hypo- or hyper-tension. Medication adherence is currently appropriate. Potassium normalized after stopping benazepril  and BP has remained at goal. Will not restart ARB at this time - Reviewed long term cardiovascular and renal outcomes of uncontrolled blood pressure - Reviewed appropriate blood pressure monitoring technique and reviewed goal blood pressure. Recommended to check home blood pressure and heart rate  once daily and keep a log to bring to upcoming appointments - Recommend to continue amlodipine  5 mg daily, isosorbide  mononitrate 30 mg daily, metoprolol  succinate 25 mg BID    Hyperlipidemia/ASCVD Risk Reduction: - Currently uncontrolled with most recent LDL-C of 108 mg/dL above goal < 55 mg/dL given ASCVD and U7IF. Worsened from 76 mg/dL one year ago due to recent nonadherence. Will recheck lipid panel at next appt given improved adherence, and pursue Repatha via patient assistance if still well above goal.  - Reviewed long term complications of uncontrolled cholesterol - Recommend to continue atorvastatin  40 mg daily    Written patient instructions provided. Patient verbalized understanding of treatment plan.    Follow Up Plan:  Pharmacist in person 06/03/23 PCP clinic visit 04/24/24   Lorain Baseman, PharmD [facility] [phone]

## 2024-04-07 NOTE — Patient Instructions (Signed)
 It was nice to see you today!  Your goal blood sugar is 80-130 before eating and less than 180 after eating. Your goal blood pressure is less than 130/80 mmHg. Please keep a log to bring to your appointments  Medication Changes:  For diabetes: Continue metformin  1000 mg (1 tablet) twice daily and Farxiga  10 mg once daily  Take Farxiga  in the morning. This medication can cause more frequent urination with more sugar in the urine. It may worsen risk for dehydration or genital infections. Focus on staying well hydrated and using good genital hygiene. Stop the medication and call our office if you develop any symptoms of genital infections, such as burning, itching, or pain while urinating or itching with redness that could be a yeast infection. If you have a day that you are vomiting or having diarrhea and you are very dehydrated, please hold this medication until you feel better.   For blood pressure: Continue amlodipine  5 mg daily, metoprolol  succinate 25 mg twice daily, and isosorbide  mononitrate 30 mg daily  For cholesterol: continue atorvastatin  40 mg daily and aspirin  81 mg daily   Monitor blood sugars at home and keep a log (glucometer or piece of paper) to bring with you to your next visit.  Keep up the good work with diet and exercise. Aim for a diet full of vegetables, fruit and lean meats (chicken, turkey, fish). Try to limit salt intake by eating fresh or frozen vegetables (instead of canned), rinse canned vegetables prior to cooking and do not add any additional salt to meals.   Lorain Baseman, PharmD [facility] [phone]

## 2024-04-13 ENCOUNTER — Emergency Department (HOSPITAL_COMMUNITY): Payer: Self-pay

## 2024-04-13 ENCOUNTER — Emergency Department (HOSPITAL_COMMUNITY): Admission: EM | Admit: 2024-04-13 | Discharge: 2024-04-14 | Discharge status: Against Medical Advice | Payer: Self-pay

## 2024-04-13 ENCOUNTER — Other Ambulatory Visit: Payer: Self-pay

## 2024-04-13 DIAGNOSIS — Z5321 Procedure and treatment not carried out due to patient leaving prior to being seen by health care provider: Secondary | ICD-10-CM | POA: Insufficient documentation

## 2024-04-13 DIAGNOSIS — M79671 Pain in right foot: Secondary | ICD-10-CM | POA: Insufficient documentation

## 2024-04-13 DIAGNOSIS — Y92410 Unspecified street and highway as the place of occurrence of the external cause: Secondary | ICD-10-CM | POA: Insufficient documentation
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
## 2024-04-14 NOTE — ED Notes (Signed)
 Pt not in the lobby. Called with no answer

## 2024-04-21 ENCOUNTER — Other Ambulatory Visit: Payer: Self-pay

## 2024-04-24 ENCOUNTER — Telehealth: Payer: Self-pay | Admitting: Nurse Practitioner

## 2024-04-24 DIAGNOSIS — E1100 Type 2 diabetes mellitus with hyperosmolarity without nonketotic hyperglycemic-hyperosmolar coma (NKHHC): Secondary | ICD-10-CM

## 2024-04-24 DIAGNOSIS — Z13220 Encounter for screening for lipoid disorders: Secondary | ICD-10-CM

## 2024-04-24 DIAGNOSIS — Z1211 Encounter for screening for malignant neoplasm of colon: Secondary | ICD-10-CM

## 2024-04-24 IMAGING — CT CT ABD-PELV W/ CM
2 of 8 series · 13 of 46 positions shown, 18 images · IV contrast (agent unspecified)
Comparison: None Available.

CLINICAL DATA: Right-sided abdominal pain.

EXAM:
CT ABDOMEN AND PELVIS WITH CONTRAST
TECHNIQUE: Multidetector CT imaging of the abdomen and pelvis was performed
using the standard protocol following bolus administration of
intravenous contrast.

[Series 3: abdomen 5.0 · axial · 0.83mm/px · z∈[+804,+1149]mm · 10 of 83 slices shown, 15 images]
[im 7/83  soft-tissue]
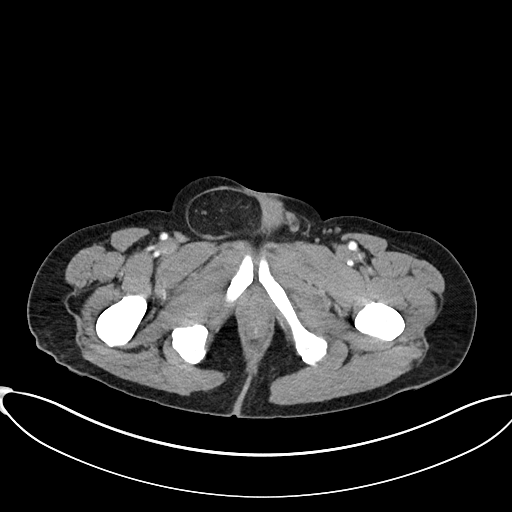
[im 7/83  bone]
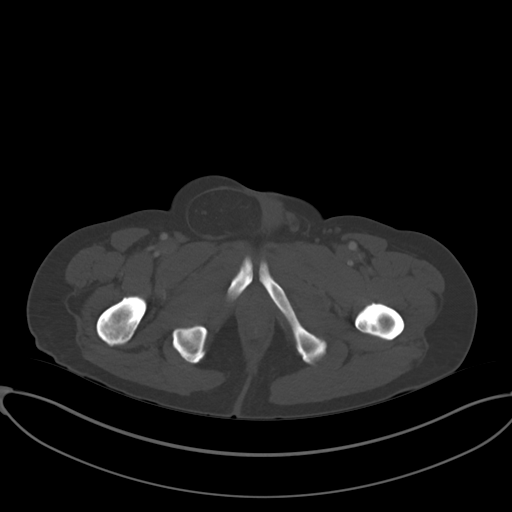
[im 14/83  soft-tissue]
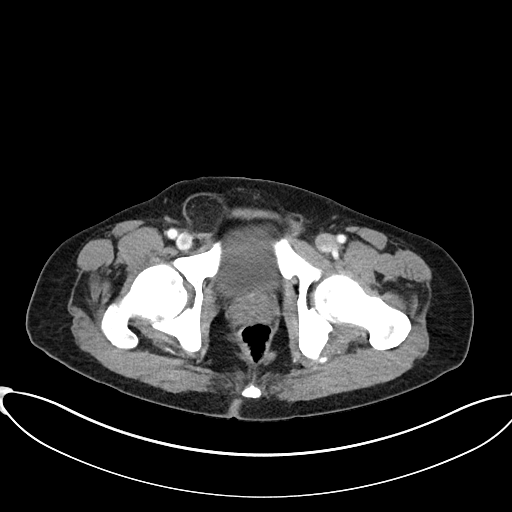
[im 28/83  soft-tissue]
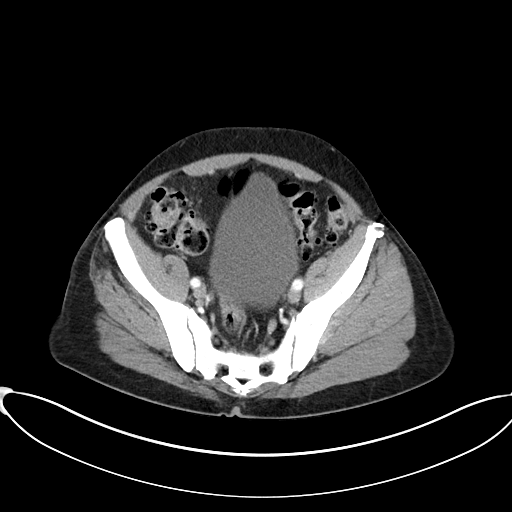
[im 35/83  soft-tissue]
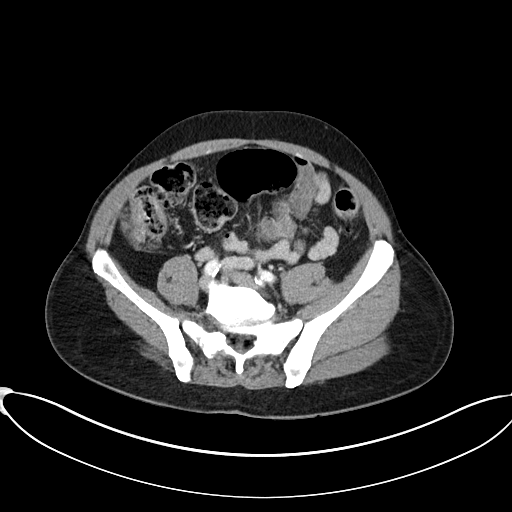
[im 42/83  soft-tissue]
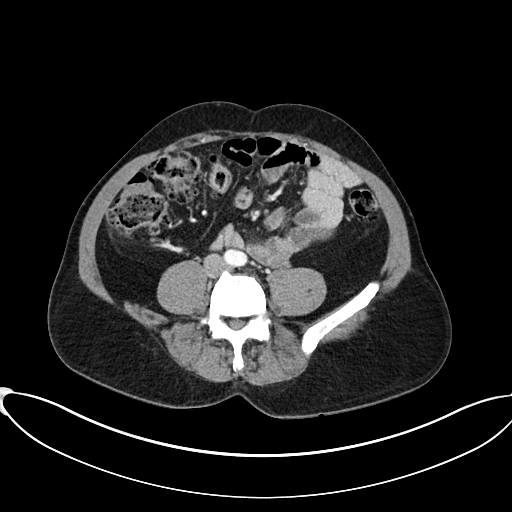
[im 48/83  soft-tissue]
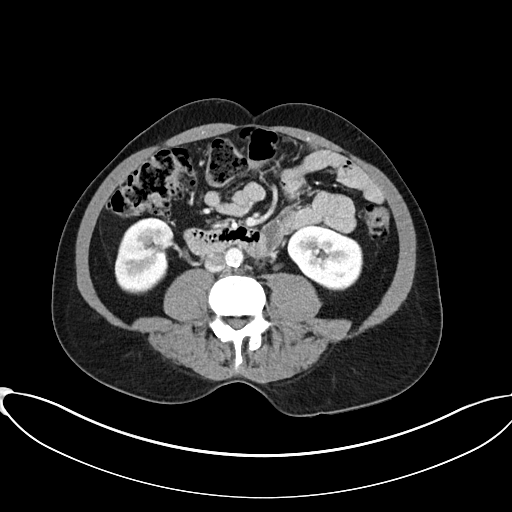
[im 55/83  soft-tissue]
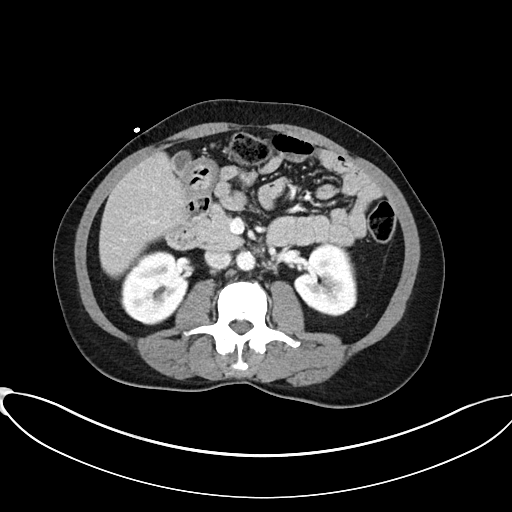
[im 55/83  lung]
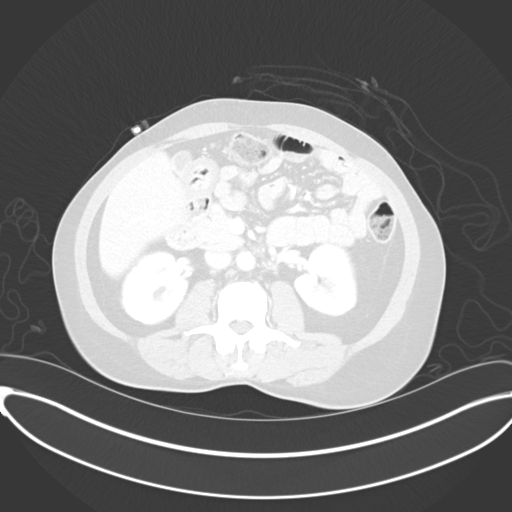
[im 62/83  lung]
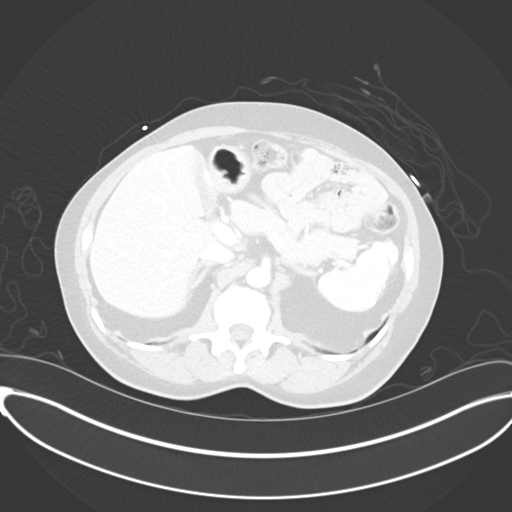
[im 69/83  soft-tissue]
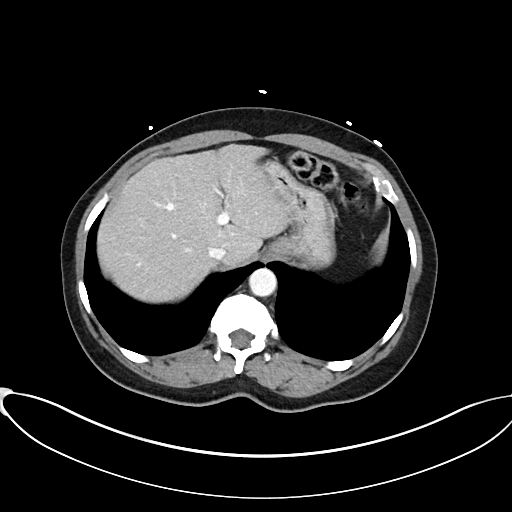
[im 69/83  lung]
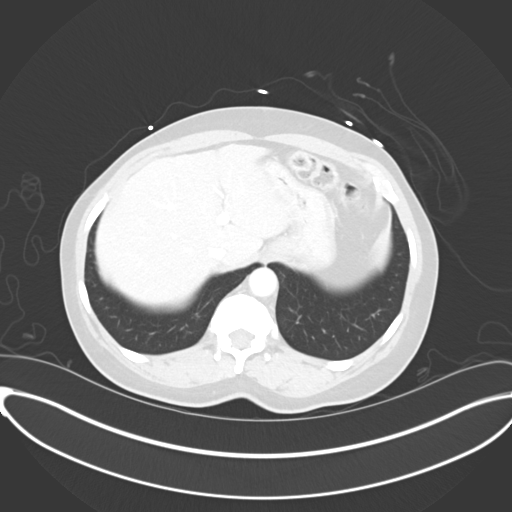
[im 76/83  soft-tissue]
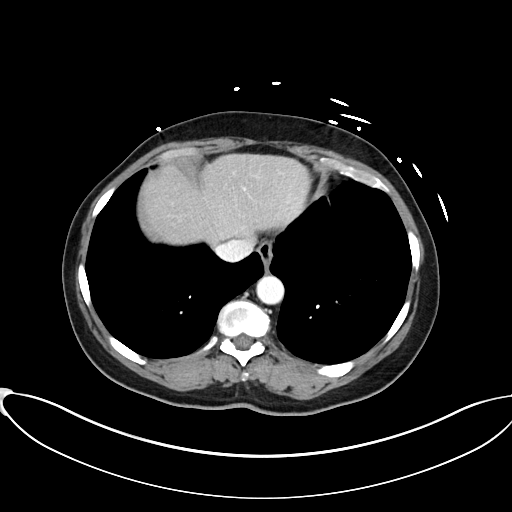
[im 76/83  lung]
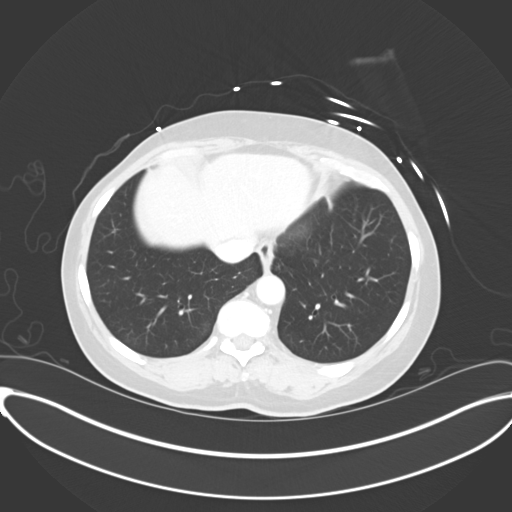
[im 76/83  bone]
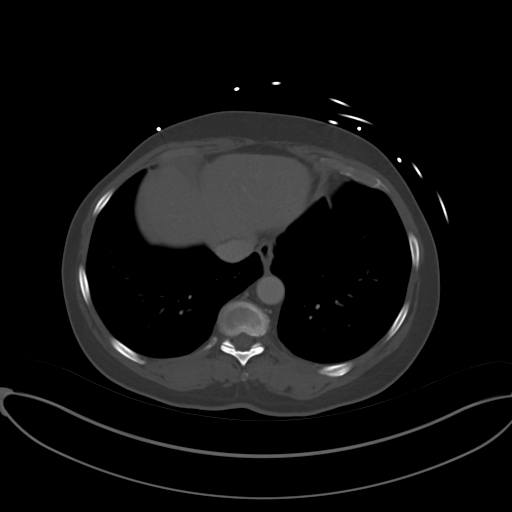

[Series 9: abdomen 3.0 mpr cor · coronal · 0.74mm/px · 3 of 90 slices shown]
[im 18/90  soft-tissue]
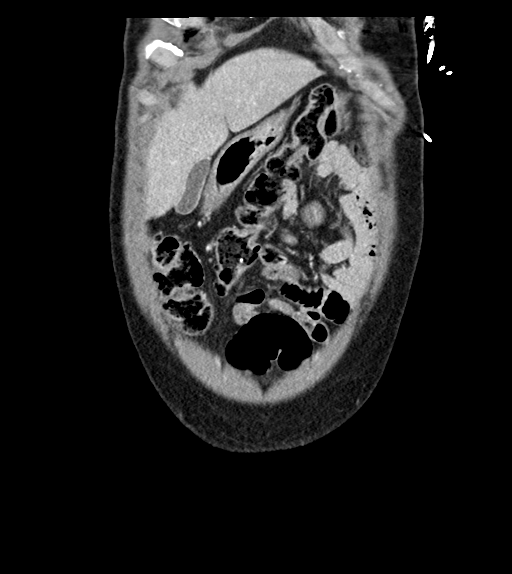
[im 36/90  soft-tissue]
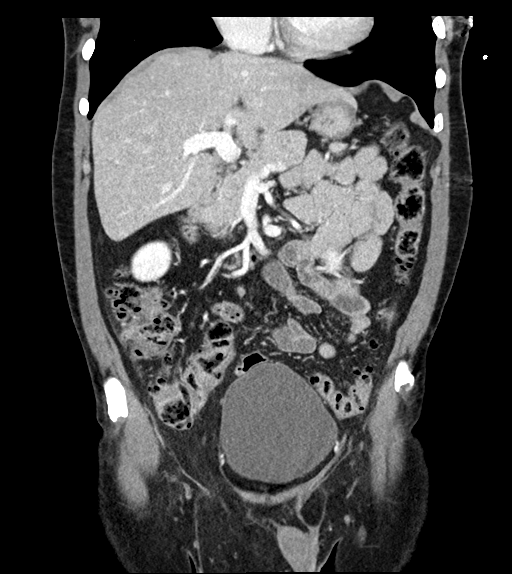
[im 54/90  soft-tissue]
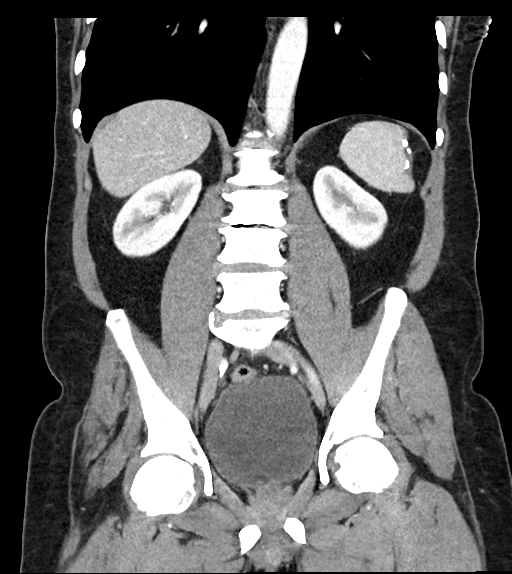

[13 of 46 positions shown; findings below may reference images not displayed]

RADIATION DOSE REDUCTION: This exam was performed according to the
departmental dose-optimization program which includes automated
exposure control, adjustment of the mA and/or kV according to
patient size and/or use of iterative reconstruction technique.

CONTRAST:  90mL OMNIPAQUE IOHEXOL 300 MG/ML  SOLN
FINDINGS: Lower chest: No acute abnormality.

Hepatobiliary: No focal liver abnormality is seen. No gallstones,
gallbladder wall thickening, or biliary dilatation.

Pancreas: Unremarkable. No pancreatic ductal dilatation or
surrounding inflammatory changes.

Spleen: Calcification along the lateral aspect of the spleen, likely
sequela of prior traumatic injury.

Adrenals/Urinary Tract: Adrenal glands are unremarkable. Kidneys are
normal, without renal calculi, focal lesion, or hydronephrosis.
Bladder is unremarkable.

Stomach/Bowel: Distal esophagus and stomach are unremarkable. Small
bowel loops are normal in caliber. Normal appendix. There are
numerous diverticula throughout the colon prominent in the cecum and
ascending colon. There is focal fatty infiltration adjacent to the
cecum concerning for acute diverticulitis (series 3, image 50). No
extraluminal free air or abdominal collection/abscess.

Vascular/Lymphatic: Aortic atherosclerosis. No enlarged abdominal or
pelvic lymph nodes.

Reproductive: Prostate is unremarkable.

Other: There are fat containing bilateral inguinal hernias, right
greater than the left. The fatty infiltration insert the hernial
sac.

Musculoskeletal: Degenerate disc disease of the lumbar spine. No
acute osseous abnormality.
IMPRESSION: 1. Numerous colonic diverticula, prominent in the cecum with mild
adjacent fat stranding concerning for cecal diverticulitis.

2. No evidence of extraluminal free air or abdominal
collection/abscess.

3.  Normal appendix.

4. Bilateral fat containing inguinal hernias, right greater than the
left.

5.  Additional chronic findings as above.

## 2024-04-24 NOTE — Progress Notes (Signed)
 Virtual Visit via Video Note  I connected with Todd Gonzalez on 04/24/24 at  9:20 AM EST by a video enabled telemedicine application and verified that I am speaking with the correct person using two identifiers.  Location: Patient: home Provider: office   I discussed the limitations of evaluation and management by telemedicine and the availability of in person appointments. The patient expressed understanding and agreed to proceed.  History of Present Illness:  Diabetes:    Patient presents today for a diabetes follow up. A1C at last visit was 8.0.  Will continue medication at current dosage.  Patient will need to return next week for lab visit only to have A1c rechecked.  Denies f/c/s, n/v/d, hemoptysis, PND, leg swelling Denies chest pain or edema     Hypertension:   Patient presents for follow-up on hypertension.  Patient is currently on isosorbide , benazepril , amlodipine .  Patient is also on atorvastatin  for cholesterol.  Denies f/c/s, n/v/d, hemoptysis, PND, leg swelling Denies chest pain or edema  Note: Will place referral for patient to GI for colon cancer screening.   Observations/Objective:     04/13/2024    9:37 PM 04/07/2024   11:21 AM 02/19/2024    2:37 PM  Vitals with BMI  Height 5' 5    Weight 158 lbs    BMI 26.29    Systolic 147 124 872  Diastolic 86 74 66  Pulse 106 71 76      Assessment and Plan:   1. Type 2 diabetes mellitus with hyperosmolar hyperglycemic state (HHS) (HCC) (Primary)  - Hemoglobin A1c; Future  2. Lipid screening  - Lipid Panel; Future  3. Colon cancer screening  - Ambulatory referral to Gastroenterology     I discussed the assessment and treatment plan with the patient. The patient was provided an opportunity to ask questions and all were answered. The patient agreed with the plan and demonstrated an understanding of the instructions.   The patient was advised to call back or seek an in-person evaluation if the  symptoms worsen or if the condition fails to improve as anticipated.  I provided 23 minutes of non-face-to-face time during this encounter.   Bascom GORMAN Borer, NP

## 2024-05-11 ENCOUNTER — Emergency Department (HOSPITAL_COMMUNITY): Payer: Self-pay

## 2024-05-11 ENCOUNTER — Encounter (HOSPITAL_COMMUNITY): Payer: Self-pay

## 2024-05-11 ENCOUNTER — Emergency Department (HOSPITAL_COMMUNITY)
Admission: EM | Admit: 2024-05-11 | Discharge: 2024-05-12 | Disposition: A | Discharge status: Home / Self Care | Payer: Self-pay | Attending: Emergency Medicine | Admitting: Emergency Medicine

## 2024-05-11 ENCOUNTER — Other Ambulatory Visit: Payer: Self-pay

## 2024-05-11 DIAGNOSIS — Z7982 Long term (current) use of aspirin: Secondary | ICD-10-CM | POA: Insufficient documentation

## 2024-05-11 DIAGNOSIS — Z7984 Long term (current) use of oral hypoglycemic drugs: Secondary | ICD-10-CM | POA: Insufficient documentation

## 2024-05-11 DIAGNOSIS — Z951 Presence of aortocoronary bypass graft: Secondary | ICD-10-CM | POA: Insufficient documentation

## 2024-05-11 DIAGNOSIS — Z79899 Other long term (current) drug therapy: Secondary | ICD-10-CM | POA: Insufficient documentation

## 2024-05-11 DIAGNOSIS — M25461 Effusion, right knee: Secondary | ICD-10-CM | POA: Insufficient documentation

## 2024-05-11 DIAGNOSIS — E1165 Type 2 diabetes mellitus with hyperglycemia: Secondary | ICD-10-CM | POA: Insufficient documentation

## 2024-05-11 DIAGNOSIS — I2510 Atherosclerotic heart disease of native coronary artery without angina pectoris: Secondary | ICD-10-CM | POA: Insufficient documentation

## 2024-05-11 DIAGNOSIS — I10 Essential (primary) hypertension: Secondary | ICD-10-CM | POA: Insufficient documentation

## 2024-05-11 LAB — CBC WITH DIFFERENTIAL/PLATELET
Abs Immature Granulocytes: 0.12 K/uL — ABNORMAL HIGH (ref 0.00–0.07)
Basophils Absolute: 0 K/uL (ref 0.0–0.1)
Basophils Relative: 1 %
Eosinophils Absolute: 0 K/uL (ref 0.0–0.5)
Eosinophils Relative: 0 %
HCT: 37.4 % — ABNORMAL LOW (ref 39.0–52.0)
Hemoglobin: 12.2 g/dL — ABNORMAL LOW (ref 13.0–17.0)
Immature Granulocytes: 1 %
Lymphocytes Relative: 12 %
Lymphs Abs: 1 K/uL (ref 0.7–4.0)
MCH: 27.9 pg (ref 26.0–34.0)
MCHC: 32.6 g/dL (ref 30.0–36.0)
MCV: 85.6 fL (ref 80.0–100.0)
Monocytes Absolute: 0.2 K/uL (ref 0.1–1.0)
Monocytes Relative: 3 %
Neutro Abs: 6.9 K/uL (ref 1.7–7.7)
Neutrophils Relative %: 83 %
Platelets: 272 K/uL (ref 150–400)
RBC: 4.37 MIL/uL (ref 4.22–5.81)
RDW: 14.6 % (ref 11.5–15.5)
WBC: 8.3 K/uL (ref 4.0–10.5)
nRBC: 0 % (ref 0.0–0.2)

## 2024-05-11 LAB — COMPREHENSIVE METABOLIC PANEL WITH GFR
ALT: 26 U/L (ref 0–44)
AST: 24 U/L (ref 15–41)
Albumin: 4.1 g/dL (ref 3.5–5.0)
Alkaline Phosphatase: 61 U/L (ref 38–126)
Anion gap: 11 (ref 5–15)
BUN: 26 mg/dL — ABNORMAL HIGH (ref 8–23)
CO2: 23 mmol/L (ref 22–32)
Calcium: 9.3 mg/dL (ref 8.9–10.3)
Chloride: 103 mmol/L (ref 98–111)
Creatinine, Ser: 1.05 mg/dL (ref 0.61–1.24)
GFR, Estimated: >60 mL/min
Glucose, Bld: 192 mg/dL — ABNORMAL HIGH (ref 70–99)
Potassium: 4.6 mmol/L (ref 3.5–5.1)
Sodium: 137 mmol/L (ref 135–145)
Total Bilirubin: 0.4 mg/dL (ref 0.0–1.2)
Total Protein: 7.2 g/dL (ref 6.5–8.1)

## 2024-05-11 LAB — I-STAT CG4 LACTIC ACID, ED: Lactic Acid, Venous: 2 mmol/L (ref 0.5–1.9)

## 2024-05-11 NOTE — ED Triage Notes (Signed)
 Pt. Arrives for pain and swelling to the back of the right knee. No redness noted. Back of the knee is warm to touch. Denies CP/SOB

## 2024-05-12 ENCOUNTER — Other Ambulatory Visit: Payer: Self-pay

## 2024-05-12 MED ORDER — OXYCODONE HCL 5 MG PO TABS
2.5000 mg | ORAL_TABLET | Freq: Four times a day (QID) | ORAL | 0 refills | Status: AC | PRN
  Filled 2024-05-12: qty 15, 4d supply, fill #0

## 2024-05-12 MED ORDER — HYDROCODONE-ACETAMINOPHEN 7.5-325 MG/15ML PO SOLN
5.0000 mL | Freq: Once | ORAL | Status: AC
  Administered 2024-05-12: 5 mL via ORAL
  Filled 2024-05-12: qty 15

## 2024-05-12 NOTE — ED Provider Notes (Signed)
 " [place] [facility]GENC[facility] DE[facility] AT [facility] Provider Note  CSN: [id number] Arrival date & time: 05/11/24 2151  Chief Complaint(s) Knee Pain  History provided by patient. HPI & MDM Todd Gonzalez is a 68 y.o. male here for several days of right knee pain.  Patient reports twisting it 3 days ago and has been having pain since.  He has noted pain and swelling worsening throughout that time.  Pain is worse in the back of the knee where the swelling is most significant.  He denies any recent falls or trauma to the knee.  No redness.  No fevers.  No wounds.  No other physical complaints.   Knee Pain  Differential diagnosis considered.  Workup below  Presentation favoring soft tissue injury.  Possibly PSL tear.  Also considering exacerbation of arthritis resulting in joint effusion.  Exam is not concerning for septic arthritis.  Doubt DVT.  Knee brace applied. Recommended follow-up with orthopedic surgery.     Medical Decision Making Amount and/or Complexity of Data Reviewed Radiology: ordered and independent interpretation performed. Decision-making details documented in ED Course.  Risk Prescription drug management.    Final Clinical Impression(s) / ED Diagnoses Final diagnoses:  Knee effusion, right   The patient appears reasonably screened and/or stabilized for discharge and I doubt any other medical condition or other [facility] requiring further screening, evaluation, or treatment in the ED at this time. I have discussed the findings, Dx and Tx plan with the patient/family who expressed understanding and agree(s) with the plan. Discharge instructions discussed at length. The patient/family was given strict return precautions who verbalized understanding of the instructions. No further questions at time of discharge.  Disposition: Discharge  Condition: Good  ED Discharge Orders          Ordered    oxyCODONE  (ROXICODONE ) 5 MG immediate release tablet  Every 6 hours  PRN        05/12/24 0203            {[employer]  narcotic database reviewed and no active prescriptions noted.  Follow Up: Oley Bascom RAMAN, NP [address] [place] [address] [phone]  Call  to schedule an appointment for close follow up  Ernie Cough, MD [address] [place] [address] [place] [address] [phone]  Call  to schedule an appointment for close follow up     Past Medical History Past Medical History:  Diagnosis Date   CAD (coronary artery disease)    ruptured plaque in OM 2003, NSTEMI/CABG 01/2020   Headache(784.0)    HLD (hyperlipidemia)    HTN (hypertension)    Mild carotid artery disease    Pre-diabetes    RBBB (right bundle branch block)    Patient Active Problem List   Diagnosis Date Noted   Hyperosmolar hyperglycemic state (HHS) (HCC)    Type 2 diabetes mellitus with hyperosmolar hyperglycemic state (HHS) (HCC) 10/10/2021   AKI (acute kidney injury) 10/10/2021   Cecal diverticulitis 10/10/2021   Hyponatremia 10/10/2021   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 Authorizing Provider  oxyCODONE  (ROXICODONE ) 5 MG  immediate release tablet Take 0.5-1 tablets (2.5-5 mg total) by mouth every 6 (six) hours as needed for up to 5 days for severe pain (pain score 7-10). 05/12/24 05/17/24 Yes Neeka Urista, Raynell Moder, MD  amLODipine  (NORVASC ) 5 MG tablet Take 1 tablet (5 mg total) by mouth daily. 04/07/24   Oley Bascom RAMAN, NP  aspirin  EC 81 MG tablet Take 1 tablet (81 mg total) by mouth daily. Swallow whole. 12/03/23    Oley Bascom RAMAN, NP  atorvastatin  (LIPITOR) 40 MG tablet Take 1 tablet (40 mg total) by mouth daily. 12/03/23   Oley Bascom RAMAN, NP  dapagliflozin  propanediol (FARXIGA ) 10 MG TABS tablet Take 1 tablet (10 mg total) by mouth daily. 04/07/24   Oley Bascom RAMAN, NP  isosorbide  mononitrate (IMDUR ) 30 MG 24 hr tablet Take 1 tablet (30 mg total) by mouth daily. 12/03/23   Oley Bascom RAMAN, NP  meclizine  (ANTIVERT ) 12.5 MG tablet Take 1 tablet (12.5 mg total) by mouth 3 (three) times daily. Patient not taking: Reported on 04/24/2024 12/20/22   Garrick Charleston, MD  metFORMIN  (GLUCOPHAGE ) 1000 MG tablet Take 1 tablet (1,000 mg total) by mouth 2 (two) times daily with a meal. 02/04/24   Oley Bascom RAMAN, NP  metoprolol  succinate (TOPROL -XL) 25 MG 24 hr tablet Take 1 tablet (25 mg total) by mouth in the morning and at bedtime. 02/04/24   Oley Bascom RAMAN, NP  nitroGLYCERIN  (NITROSTAT ) 0.4 MG SL tablet Place 1 tablet (0.4 mg total) under the tongue every 5 (five) minutes as needed for chest pain. Patient not taking: Reported on 04/24/2024 10/11/21   Sebastian Toribio GAILS, MD                                                                                                                                    Allergies Patient has no known allergies.  Review of Systems Review of Systems As noted in HPI  Physical Exam Vital Signs  I have reviewed the triage vital signs BP 126/73 (BP Location: Left Arm)   Pulse 82   Temp 98.4 F (36.9 C) (Oral)   Resp 16   Wt 58.3 kg   SpO2 96%   BMI 21.40 kg/m   Physical Exam Vitals reviewed.  Constitutional:      General: He is not in acute distress.    Appearance: He is well-developed. He is not diaphoretic.  HENT:     Head: Normocephalic and atraumatic.     Right Ear: External ear normal.     Left Ear: External ear normal.     Nose: Nose normal.     Mouth/Throat:     Mouth: Mucous membranes are moist.  Eyes:     General: No scleral icterus.     Conjunctiva/sclera: Conjunctivae normal.  Neck:     Trachea: Phonation normal.  Cardiovascular:     Rate and Rhythm: Normal rate and regular rhythm.  Pulmonary:  Effort: Pulmonary effort is normal. No respiratory distress.     Breath sounds: No stridor.  Abdominal:     General: There is no distension.  Musculoskeletal:        General: Normal range of motion.     Cervical back: Normal range of motion.     Right knee: Deformity (Varus) and effusion present. No erythema, ecchymosis, lacerations, bony tenderness or crepitus. Normal range of motion. Tenderness present. PCL laxity present. No LCL laxity, MCL laxity or ACL laxity. Normal meniscus. Normal pulse.     Instability Tests: Anterior drawer test negative. Posterior drawer test positive.     Left knee: Normal pulse.  Neurological:     Mental Status: He is alert and oriented to person, place, and time.  Psychiatric:        Behavior: Behavior normal.     ED Results and Treatments Labs (all labs ordered are listed, but only abnormal results are displayed) Labs Reviewed  COMPREHENSIVE METABOLIC PANEL WITH GFR - Abnormal; Notable for the following components:      Result Value   Glucose, Bld 192 (*)    BUN 26 (*)    All other components within normal limits  CBC WITH DIFFERENTIAL/PLATELET - Abnormal; Notable for the following components:   Hemoglobin 12.2 (*)    HCT 37.4 (*)    Abs Immature Granulocytes 0.12 (*)    All other components within normal limits  I-STAT CG4 LACTIC ACID, ED - Abnormal; Notable for the following components:   Lactic Acid, Venous 2.0 (*)    All other components within normal limits  I-STAT CG4 LACTIC ACID, ED                                                                                                                         EKG  EKG Interpretation Date/Time:    Ventricular Rate:    PR Interval:    QRS Duration:    QT Interval:    QTC Calculation:   R Axis:      Text Interpretation:          Radiology DG Knee Complete 4 Views Right Result Date: 05/11/2024 EXAM: 4 VIEW(S) XRAY OF THE KNEE 05/11/2024 10:46:27 PM COMPARISON: 03/30/2013 CLINICAL HISTORY: swelling FINDINGS: BONES AND JOINTS: No acute fracture. No malalignment. Heterotopic ossification about the knee. Tricompartmental joint space narrowing, sclerosis and osteophytes consistent with osteoarthritis. Chronic bony remodelling about the medial compartment. Calcifications posteriorly may represent loose bodies. Moderate suprapatellar effusion. SOFT TISSUES: The soft tissues are unremarkable. IMPRESSION: 1. Moderate osteoarthritis. Moderate suprapatellar effusion. Electronically signed by: Norman Gatlin MD 05/11/2024 11:00 PM EST RP Workstation: [id number]    Medications Ordered in ED Medications  HYDROcodone -acetaminophen  (HYCET) 7.5-325 mg/15 ml solution 5 mL (has no administration in time range)   Procedures Procedures  (including critical care time)   This chart was dictated using voice recognition software.  Despite best efforts to proofread,  errors can occur which can change the documentation meaning.  Trine Raynell Moder, MD 05/12/24 [phone]  "

## 2024-05-12 NOTE — Discharge Instructions (Addendum)
 For pain control you may take 1000 mg of Tylenol every 8 hours scheduled.  In addition you can take 0.5 to 1 tablet of Oxycodone every 6 hours as needed for pain not controlled with the scheduled Tylenol.

## 2024-05-18 ENCOUNTER — Other Ambulatory Visit: Payer: Self-pay

## 2024-05-29 ENCOUNTER — Other Ambulatory Visit: Payer: Self-pay

## 2024-06-02 ENCOUNTER — Other Ambulatory Visit: Payer: Self-pay

## 2024-06-02 ENCOUNTER — Ambulatory Visit: Payer: Self-pay

## 2024-06-02 VITALS — BP 131/74

## 2024-06-02 DIAGNOSIS — I2510 Atherosclerotic heart disease of native coronary artery without angina pectoris: Secondary | ICD-10-CM

## 2024-06-02 DIAGNOSIS — E1100 Type 2 diabetes mellitus with hyperosmolarity without nonketotic hyperglycemic-hyperosmolar coma (NKHHC): Secondary | ICD-10-CM

## 2024-06-02 DIAGNOSIS — Z13220 Encounter for screening for lipoid disorders: Secondary | ICD-10-CM

## 2024-06-02 LAB — POCT GLYCOSYLATED HEMOGLOBIN (HGB A1C): HbA1c, POC (controlled diabetic range): 8.3 % — AB (ref 0.0–7.0)

## 2024-06-02 MED ORDER — TRULICITY 0.75 MG/0.5ML ~~LOC~~ SOAJ
0.7500 mg | SUBCUTANEOUS | 3 refills | Status: AC
  Filled 2024-06-02: qty 2, 28d supply, fill #0

## 2024-06-02 NOTE — Patient Instructions (Signed)
 It was nice to see you today!  Your goal blood sugar is 80-130 mg/dL before eating and less than 180 mg/dL after eating. Monitor blood sugars at home and keep a log (glucometer or piece of paper) to bring with you to your next visit.  Your goal blood pressure is less than 130/80 mmHg. Record values and bring log to review to your next appointment.  Medication Changes: START Trulicity  0.75 mg once weekly  Counseling Points This medication reduces your appetite and may make you feel fuller longer.  Stop eating when your body tells you that you are full. This will likely happen sooner than you are used to. Store your medication in the fridge until you are ready to use it. Inject your medication in the fatty tissue of your lower abdominal area (2 inches away from belly button) or upper outer thigh. Rotate injection sites. Each pen will last you about 1 dose. Use a different pen each week. Common side effects include: nausea, diarrhea/constipation, and heartburn, and are more likely to occur if you overeat.  Try to avoid taking Aleve every day. You can continue to take as needed for knee pain. Tylenol  is a safer medication if you need additional pain relief. You can take up to 1000 mg three times per day.  Continue all other medication the same.   Lifestyle Recommendations:  Aim for 150 minutes of moderate intensity exercise every week. This is any activity that elevates your heart rate and breathing rate, but still allows you to carry on a conversation. Exercising after meals can help prevent spikes in your blood sugar.  Diet Recommendations:   Try to eat 3 real meals using the healthy plate method and 1-2 snacks per day. Never go more than 4-5 hours while awake without eating. Eat breakfast within the first hour of getting up.     Carbohydrates include starch, sugar, and fiber. Sugar and starch raise blood glucose.  Starchy foods include bread, rice, pasta, potatoes, corn, cereal, grits,  crackers, bagels, muffins, all baked foods Some fruits are higher in sugar, like grapes, watermelon, oranges, and most tropical fruits. Limit your serving size to 1/2 cup at a time.  Protein foods include meat, fish, poultry, eggs, dairy, and beans (although beans also provide carbohydrates).  Non-starchy vegetables do not impact your blood sugar very much. These include greens, broccoli, asparagus, carrots, cauliflower, cucumber, mushrooms, peppers, yellow squash, zucchini squash, tomato, to name a few!  Avoid all sugary beverages. Stay hydrated with water  throughout the day. Sparkling/flavored water , unsweet tea, black coffee, and zero-sugar or diet drinks will not impact your blood sugar, but they should not be used as your only source of hydration!  You can find more information at https://diabetes.org/food-nutrition/eating-healthy   Todd Gonzalez, PharmD [facility] [phone]

## 2024-06-02 NOTE — Progress Notes (Signed)
 "  06/02/2024 Name: Todd Gonzalez MRN: [id number] DOB: Sep 30, 1955  Chief Complaint  Patient presents with   Diabetes   Hypertension    Todd Gonzalez is a 69 y.o. year old male who presented for a face-to-face visit.    They were referred to the pharmacist by their PCP for assistance in managing diabetes.  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 At last visit, A1C had improved from 8.1% to 6.8%. BP was 129/64 mmHg. He had been contacted by pharmacy several times over the past few months to follow-up on suspected adherence issues. He was seen in person by pharmacy on 12/03/23 and reported he had difficulty affording his medications. He was transitioned to [facility] pharmacy for affordability. He saw PCP on 01/17/24. A1C had increased to 8.0% in the setting of medication nonadherence. He was encouraged to refill his medications at [facility]. BP was controlled. At pharmacy visit on 02/04/24, patient was still reporting financial barriers to filling his medications. He was instructed to stop glipizide  due to side effects. Collaborated with [facility] pharmacy to fill metformin , isosorbide , and metoprolol . I had him sign paper work for Farxiga  PAP. He was also instructed to hold benazepril  for K of 6.1 on 10/17/23. He was instructed to return for repeat labs in ~2 weeks. At appt on 05/22/23, K had normalized off benazepril  and BP remained at goal. We applied for Farxiga  via AZ[employer]Me and Medicare LIS. At pharmacy appt on 04/07/24, patient had received Farxiga  and started taking it without issue. BP was at goal.  Today, patient reports doing well. Has the appropriate medication bottles. He reports he has had increased alcohol use (gin) over the past few weeks. He is still having knee pain. Reports that oxycodone  relieved pain and asked if he could obtain a refill. Reports he is taking 1-2 aleve every other day for knee  pain. BP at home has been well controlled with SBPs in the 120s.   Care Team: Primary Care Provider: Borer Bascom RAMAN, NP ; Next Scheduled Visit: 04/24/24  Medication Access/Adherence  Current Pharmacy:  [facility] [address] [place] [address] Phone: [phone] Fax: [phone]  [facility] [place], [place] - [address] [place] [address] Phone: [phone] Fax: [phone]   Patient reports affordability concerns with their medications: Yes  - no insurance, using [facility] and [facility]  Patient reports access/transportation concerns to their pharmacy: Yes  - uses the bus system, but reports he is able to get to [facility]  Patient reports adherence concerns with their medications:  Yes  - due to cost. Currently has appropriate medications  Diabetes:  Current medications: metformin  IR 1000 mg BID with meals, Farxiga  10 mg daily  Current glucose readings:  Using glucometer; testing occasionally: last checked 3 days ago, reports FBG was ~124 m/dL  Denies s/sx of hypoglycemia.  Patient denies hyperglycemic symptoms including polyuria, polydipsia, polyphagia, nocturia, neuropathy, blurred vision.  Current meal patterns: two meals/day (wakes up around 12-1PM). Tries to avoid pure white rice. Eats wheat bread.   - Lunch - fufu, soup - Supper - Yesterday had fish soup - tomato, onion, pepper. - Drinks: sweet tea occasionally, drinks a lot of water  during the day. Increased alcohol (gin) over the past few weeks -denies mixing with sugary beverage.  Current medication access support: Farxiga  AZ&Me approved through Sept 2026  Hypertension:  Current medications: amlodipine  5 mg daily, metoprolol  succinate 25 mg BID, isosorbide  mononitrate 30 mg daily Previous: benazepril  (hyperkalemia)  Smokes about 2-3 cigarettes per day - he reports that he is trying to cut back but is not ready to quit  at this time  Patient has a validated, automated, upper arm home BP  cuff - recalls BP of 124/80s at home  Patient denies hypotensive s/sx including dizziness, lightheadedness. - had dizziness with accidental extra dose of metoprolol  recently. Patient denies hypertensive symptoms including headache, chest pain, shortness of breath   Hyperlipidemia/ASCVD Risk Reduction  Current lipid lowering medications: atorvastatin  40 mg daily  Antiplatelet regimen: ASA 81 mg daily  ASCVD History: CABG, NSTEMI, unstable angina Risk Factors: tobacco use, ASCVD, T2DM  Clinical ASCVD: Yes  The ASCVD Risk score (Arnett DK, et al., 2019) failed to calculate for the following reasons:   Risk score cannot be calculated because patient has a medical history suggesting prior/existing ASCVD   * - Cholesterol units were assumed    Objective:  BP Readings from Last 3 Encounters:  06/02/24 131/74  05/12/24 120/80  04/13/24 (!) 147/86    Lab Results  Component Value Date   HGBA1C 8.3 (A) 06/02/2024   HGBA1C 8.0 (A) 01/17/2024   HGBA1C 7.0 12/03/2023       Latest Ref Rng & Units 05/11/2024   10:31 PM 02/19/2024    9:59 AM 01/17/2024    9:34 AM  BMP  Glucose 70 - 99 mg/dL 807  874  865   BUN 8 - 23 mg/dL 26  18  30    Creatinine 0.61 - 1.24 mg/dL 8.94  8.90  8.72   BUN/Creat Ratio 10 - 24  17  24    Sodium 135 - 145 mmol/L 137  141  136   Potassium 3.5 - 5.1 mmol/L 4.6  4.5  6.1   Chloride 98 - 111 mmol/L 103  103  102   CO2 22 - 32 mmol/L 23  24  20    Calcium  8.9 - 10.3 mg/dL 9.3  9.3  9.2     Lab Results  Component Value Date   CHOL 196 01/17/2024   HDL 66 01/17/2024   LDLCALC 108 (H) 01/17/2024   TRIG 124 01/17/2024   CHOLHDL 3.0 01/17/2024    Medications Reviewed Today     Reviewed by Brinda Lorain SQUIBB, RPH-CPP (Pharmacist) on 06/02/24 at 1255  Med List Status: <None>   Medication Order Taking? Sig Documenting Provider Last Dose Status Informant  amLODipine  (NORVASC ) 5 MG tablet  [id number] Yes Take 1 tablet (5 mg total) by mouth daily. Oley Bascom RAMAN, NP  Active   aspirin  EC 81 MG tablet [id number] Yes Take 1 tablet (81 mg total) by mouth daily. Swallow whole. Oley Bascom RAMAN, NP  Active   atorvastatin  (LIPITOR) 40 MG tablet [id number] Yes Take 1 tablet (40 mg total) by mouth daily. Oley Bascom RAMAN, NP  Active   dapagliflozin  propanediol (FARXIGA ) 10 MG TABS tablet [id number] Yes Take 1 tablet (10 mg total) by mouth daily. Oley Bascom RAMAN, NP  Active   Dulaglutide  (TRULICITY ) 0.75 MG/0.5ML EMMANUEL [id number] Yes Inject 0.75 mg into the skin once a week. Oley Bascom RAMAN, NP  Active   isosorbide  mononitrate (IMDUR ) 30 MG 24 hr tablet [id number] Yes Take 1 tablet (30 mg total) by mouth daily. Oley Bascom RAMAN, NP  Active   meclizine  (ANTIVERT ) 12.5 MG  tablet [id number]  Take 1 tablet (12.5 mg total) by mouth 3 (three) times daily.  Patient not taking: Reported on 04/24/2024   Garrick Charleston, MD  Active   metFORMIN  (GLUCOPHAGE ) 1000 MG tablet [id number] Yes Take 1 tablet (1,000 mg total) by mouth 2 (two) times daily with a meal. Oley Bascom RAMAN, NP  Active   metoprolol  succinate (TOPROL -XL) 25 MG 24 hr tablet [id number] Yes Take 1 tablet (25 mg total) by mouth in the morning and at bedtime. Oley Bascom RAMAN, NP  Active   nitroGLYCERIN  (NITROSTAT ) 0.4 MG SL tablet [id number]  Place 1 tablet (0.4 mg total) under the tongue every 5 (five) minutes as needed for chest pain.  Patient not taking: Reported on 04/24/2024   Sebastian Toribio GAILS, MD  Active               Assessment/Plan:   Diabetes: - Currently uncontrolled with A1C today of 8.3% above goal <7%, and worsened from 8.0% in August 2025, despite initiation of Farxiga . Patient reports medication adherence and denies significant diet changes. He is a good candidate for initiation of GLP-1RA with hx of ASCVD and uncontrolled blood sugars. Will pursue Trulicity  via [facility] supply at [facility] pharmacy.  - Last UACR 07/27/22: 8 mg/g -  Reviewed long term cardiovascular and renal outcomes of uncontrolled blood sugar - Reviewed goal A1c, goal fasting, and goal 2 hour post prandial glucose - Reviewed dietary modifications including  utilizing the healthy plate method, limiting portion size of carbohydrate foods, increasing intake of protein and non-starchy vegetables. Counseled patient to stay hydrated with water  throughout the day. - Recommend to continue metformin  1000 mg BID, Farxiga  10 mg daily - Recommend to START Trulicity  0.75 mg weekly. Extensively counseled on storage, administration, and GI AE. - Patient denies personal or family history of multiple endocrine neoplasia type 2, medullary thyroid cancer; personal history of pancreatitis or gallbladder disease. - Recommend to check glucose twice daily: fasting and 2-hr PPG. Counseled patient to bring glucometer or BG log to every appointment. - Next A1C due 08/31/24   Hypertension: - Currently controlled with clinic BP at goal less than 130/80, and patient had not taken BP medications yet this AM. Patient is not having s/sx of hypo- or hyper-tension. Medication adherence is currently appropriate. Potassium normalized after stopping benazepril  and BP has remained at goal. Will not restart ARB at this time - Reviewed long term cardiovascular and renal outcomes of uncontrolled blood pressure - Reviewed appropriate blood pressure monitoring technique and reviewed goal blood pressure. Recommended to check home blood pressure and heart rate  once daily and keep a log to bring to upcoming appointments - Recommend to continue amlodipine  5 mg daily, isosorbide  mononitrate 30 mg daily, metoprolol  succinate 25 mg BID    Hyperlipidemia/ASCVD Risk Reduction: - Currently uncontrolled with most recent LDL-C of 108 mg/dL above goal < 55 mg/dL given ASCVD and U7IF. Worsened from 76 mg/dL one year ago due to recent nonadherence. Will recheck lipid panel today given improved adherence, and  pursue Repatha via patient assistance if still well above goal.  - Reviewed long term complications of uncontrolled cholesterol - Recommend to continue atorvastatin  40 mg daily    Written patient instructions provided. Patient verbalized understanding of treatment plan.    Follow Up Plan:  Pharmacist in person 06/23/24 PCP clinic visit 07/23/24 Patient seen with Derral Slocumb, PY2   Lorain Baseman, PharmD [facility] [phone]   "

## 2024-06-03 ENCOUNTER — Ambulatory Visit: Payer: Self-pay | Admitting: Nurse Practitioner

## 2024-06-03 LAB — LIPID PANEL
Chol/HDL Ratio: 1.9 ratio (ref 0.0–5.0)
Cholesterol, Total: 155 mg/dL (ref 100–199)
HDL: 81 mg/dL
LDL Chol Calc (NIH): 54 mg/dL (ref 0–99)
Triglycerides: 119 mg/dL (ref 0–149)
VLDL Cholesterol Cal: 20 mg/dL (ref 5–40)

## 2024-06-22 ENCOUNTER — Encounter: Payer: Self-pay | Admitting: Gastroenterology

## 2024-06-23 ENCOUNTER — Ambulatory Visit: Payer: Self-pay

## 2024-06-23 ENCOUNTER — Telehealth: Payer: Self-pay

## 2024-06-23 NOTE — Progress Notes (Signed)
 Contacted patient to reschedule pharmacy appt due to weather. Patient agreeable to reschedule, as he had not been able to find a bus to get over here. Confirmed that he started Trulicity . Patient denied issues or side effects since starting the medication. States he has one pen left. Advised to refill at [facility] pharmacy when he runs out. Patient to return to see PCP in ~1 mo. Advised to call if he needs anything earlier.  Lorain Baseman, PharmD [facility] [phone]

## 2024-06-23 NOTE — Progress Notes (Unsigned)
 "  06/23/2024 Name: Todd Gonzalez MRN: [id number] DOB: June 19, 1955  No chief complaint on file.   Todd Gonzalez is a 69 y.o. year old male who presented for a face-to-face visit.    They were referred to the pharmacist by their PCP for assistance in managing diabetes.  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 At last visit, A1C had improved from 8.1% to 6.8%. BP was 129/64 mmHg. He had been contacted by pharmacy several times over the past few months to follow-up on suspected adherence issues. He was seen in person by pharmacy on 12/03/23 and reported he had difficulty affording his medications. He was transitioned to [facility] pharmacy for affordability. He saw PCP on 01/17/24. A1C had increased to 8.0% in the setting of medication nonadherence. He was encouraged to refill his medications at [facility]. BP was controlled. At pharmacy visit on 02/04/24, patient was still reporting financial barriers to filling his medications. He was instructed to stop glipizide  due to side effects. Collaborated with [facility] pharmacy to fill metformin , isosorbide , and metoprolol . I had him sign paper work for Farxiga  PAP. He was also instructed to hold benazepril  for K of 6.1 on 10/17/23. He was instructed to return for repeat labs in ~2 weeks. At appt on 05/22/23, K had normalized off benazepril  and BP remained at goal. We applied for Farxiga  via AZ&Me and Medicare LIS. At pharmacy appt on 04/07/24, patient had received Farxiga  and started taking it without issue. BP was at goal. At pharmacy appt on 06/02/24, A1C had increased to 8.3% and patient was initiated on Trulicity  0.75 mg weekly.  Today, ***  Today, patient reports doing well. Has the appropriate medication bottles. He reports he has had increased alcohol use (gin) over the past few weeks. He is still having knee pain. Reports that oxycodone  relieved pain and asked if he  could obtain a refill. Reports he is taking 1-2 aleve every other day for knee pain. BP at home has been well controlled with SBPs in the 120s.   Care Team: Primary Care Provider: Borer Bascom RAMAN, NP ; Next Scheduled Visit: 04/24/24  Medication Access/Adherence  Current Pharmacy:  [facility] [address] [place] [address] Phone: [phone] Fax: [phone]  [facility] - [place], [place] - [address] [place] [address] Phone: [phone] Fax: [phone]   Patient reports affordability concerns with their medications: Yes  - no insurance, using [facility] and [facility]  Patient reports access/transportation concerns to their pharmacy: Yes  - uses the bus system, but reports he is able to get to [facility]  Patient reports adherence concerns with their medications:  Yes  - due to cost. Currently has appropriate medications  Diabetes:  Current medications: metformin  IR 1000 mg BID with meals, Farxiga  10 mg daily  Current glucose readings:  Using glucometer; testing occasionally: last checked 3 days ago, reports FBG was ~124 m/dL  Denies s/sx of hypoglycemia.  Patient denies hyperglycemic symptoms including polyuria, polydipsia, polyphagia, nocturia, neuropathy, blurred vision.  Current meal patterns: two meals/day (wakes up around 12-1PM). Tries to avoid pure white rice. Eats wheat bread.   - Lunch - fufu, soup - Supper - Yesterday had fish soup - tomato, onion, pepper. - Drinks: sweet tea occasionally, drinks a lot of water  during the  day. Increased alcohol (gin) over the past few weeks -denies mixing with sugary beverage.  Current medication access support: Farxiga  AZ&Me approved through Sept 2026  Hypertension:  Current medications: amlodipine  5 mg daily, metoprolol  succinate 25 mg BID, isosorbide  mononitrate 30 mg daily Previous: benazepril  (hyperkalemia)  Smokes about 2-3  cigarettes per day - he reports that he is trying to cut back but is not ready to quit at this time  Patient has a validated, automated, upper arm home BP  cuff - recalls BP of 124/80s at home  Patient denies hypotensive s/sx including dizziness, lightheadedness. - had dizziness with accidental extra dose of metoprolol  recently. Patient denies hypertensive symptoms including headache, chest pain, shortness of breath   Hyperlipidemia/ASCVD Risk Reduction  Current lipid lowering medications: atorvastatin  40 mg daily  Antiplatelet regimen: ASA 81 mg daily  ASCVD History: CABG, NSTEMI, unstable angina Risk Factors: tobacco use, ASCVD, T2DM  Clinical ASCVD: Yes  The ASCVD Risk score (Arnett DK, et al., 2019) failed to calculate for the following reasons:   Risk score cannot be calculated because patient has a medical history suggesting prior/existing ASCVD   * - Cholesterol units were assumed    Objective:  BP Readings from Last 3 Encounters:  06/02/24 131/74  05/12/24 120/80  04/13/24 (!) 147/86    Lab Results  Component Value Date   HGBA1C 8.3 (A) 06/02/2024   HGBA1C 8.0 (A) 01/17/2024   HGBA1C 7.0 12/03/2023       Latest Ref Rng & Units 05/11/2024   10:31 PM 02/19/2024    9:59 AM 01/17/2024    9:34 AM  BMP  Glucose 70 - 99 mg/dL 807  874  865   BUN 8 - 23 mg/dL 26  18  30    Creatinine 0.61 - 1.24 mg/dL 8.94  8.90  8.72   BUN/Creat Ratio 10 - 24  17  24    Sodium 135 - 145 mmol/L 137  141  136   Potassium 3.5 - 5.1 mmol/L 4.6  4.5  6.1   Chloride 98 - 111 mmol/L 103  103  102   CO2 22 - 32 mmol/L 23  24  20    Calcium  8.9 - 10.3 mg/dL 9.3  9.3  9.2     Lab Results  Component Value Date   CHOL 155 06/02/2024   HDL 81 06/02/2024   LDLCALC 54 06/02/2024   TRIG 119 06/02/2024   CHOLHDL 1.9 06/02/2024    Medications Reviewed Today   Medications were not reviewed in this encounter       Assessment/Plan:   Diabetes: - Currently uncontrolled with A1C today  of 8.3% above goal <7%, and worsened from 8.0% in August 2025, despite initiation of Farxiga . Patient reports medication adherence and denies significant diet changes. He is a good candidate for initiation of GLP-1RA with hx of ASCVD and uncontrolled blood sugars. Will pursue Trulicity  via [facility] supply at [facility] pharmacy.  - Last UACR 07/27/22: 8 mg/g - Reviewed long term cardiovascular and renal outcomes of uncontrolled blood sugar - Reviewed goal A1c, goal fasting, and goal 2 hour post prandial glucose - Reviewed dietary modifications including  utilizing the healthy plate method, limiting portion size of carbohydrate foods, increasing intake of protein and non-starchy vegetables. Counseled patient to stay hydrated with water  throughout the day. - Recommend to continue metformin  1000 mg BID, Farxiga  10 mg daily - Recommend to START Trulicity  0.75 mg weekly. Extensively counseled on storage, administration, and GI AE. - Patient denies personal  or family history of multiple endocrine neoplasia type 2, medullary thyroid cancer; personal history of pancreatitis or gallbladder disease. - Recommend to check glucose twice daily: fasting and 2-hr PPG. Counseled patient to bring glucometer or BG log to every appointment. - Next A1C due 08/31/24   Hypertension: - Currently controlled with clinic BP at goal less than 130/80, and patient had not taken BP medications yet this AM. Patient is not having s/sx of hypo- or hyper-tension. Medication adherence is currently appropriate. Potassium normalized after stopping benazepril  and BP has remained at goal. Will not restart ARB at this time - Reviewed long term cardiovascular and renal outcomes of uncontrolled blood pressure - Reviewed appropriate blood pressure monitoring technique and reviewed goal blood pressure. Recommended to check home blood pressure and heart rate  once daily and keep a log to bring to upcoming appointments - Recommend to continue amlodipine  5 mg  daily, isosorbide  mononitrate 30 mg daily, metoprolol  succinate 25 mg BID    Hyperlipidemia/ASCVD Risk Reduction: - Currently uncontrolled with most recent LDL-C of 108 mg/dL above goal < 55 mg/dL given ASCVD and U7IF. Worsened from 76 mg/dL one year ago due to recent nonadherence. Will recheck lipid panel today given improved adherence, and pursue Repatha via patient assistance if still well above goal.  - Reviewed long term complications of uncontrolled cholesterol - Recommend to continue atorvastatin  40 mg daily    Written patient instructions provided. Patient verbalized understanding of treatment plan.    Follow Up Plan:  Pharmacist in person 06/23/24 PCP clinic visit 07/23/24 Patient seen with Derral Slocumb, PY2   Lorain Baseman, PharmD [facility] [phone]   "

## 2024-07-23 ENCOUNTER — Ambulatory Visit: Payer: Self-pay | Admitting: Nurse Practitioner

## 2024-08-18 ENCOUNTER — Ambulatory Visit: Payer: Self-pay
